# Patient Record
Sex: Female | Born: 1940 | Race: White | Hispanic: No | Marital: Single | Smoking: Former smoker
Health system: Southern US, Community
[De-identification: ages and names within clinical notes are randomized; demographics above are authoritative.]

## PROBLEM LIST (undated history)

## (undated) DIAGNOSIS — I639 Cerebral infarction, unspecified: Secondary | ICD-10-CM

## (undated) DIAGNOSIS — IMO0002 Reserved for concepts with insufficient information to code with codable children: Secondary | ICD-10-CM

## (undated) DIAGNOSIS — R519 Headache, unspecified: Secondary | ICD-10-CM

## (undated) DIAGNOSIS — N6019 Diffuse cystic mastopathy of unspecified breast: Secondary | ICD-10-CM

## (undated) DIAGNOSIS — E7800 Pure hypercholesterolemia, unspecified: Secondary | ICD-10-CM

## (undated) DIAGNOSIS — J45909 Unspecified asthma, uncomplicated: Secondary | ICD-10-CM

## (undated) DIAGNOSIS — K625 Hemorrhage of anus and rectum: Secondary | ICD-10-CM

## (undated) DIAGNOSIS — F410 Panic disorder [episodic paroxysmal anxiety] without agoraphobia: Secondary | ICD-10-CM

## (undated) DIAGNOSIS — F4310 Post-traumatic stress disorder, unspecified: Secondary | ICD-10-CM

## (undated) DIAGNOSIS — M1990 Unspecified osteoarthritis, unspecified site: Secondary | ICD-10-CM

## (undated) DIAGNOSIS — F32A Depression, unspecified: Secondary | ICD-10-CM

## (undated) DIAGNOSIS — K589 Irritable bowel syndrome without diarrhea: Secondary | ICD-10-CM

## (undated) DIAGNOSIS — K219 Gastro-esophageal reflux disease without esophagitis: Secondary | ICD-10-CM

## (undated) DIAGNOSIS — M797 Fibromyalgia: Secondary | ICD-10-CM

## (undated) DIAGNOSIS — G459 Transient cerebral ischemic attack, unspecified: Secondary | ICD-10-CM

## (undated) DIAGNOSIS — S46009A Unspecified injury of muscle(s) and tendon(s) of the rotator cuff of unspecified shoulder, initial encounter: Secondary | ICD-10-CM

## (undated) DIAGNOSIS — F329 Major depressive disorder, single episode, unspecified: Secondary | ICD-10-CM

## (undated) DIAGNOSIS — M8580 Other specified disorders of bone density and structure, unspecified site: Secondary | ICD-10-CM

## (undated) DIAGNOSIS — R51 Headache: Secondary | ICD-10-CM

## (undated) DIAGNOSIS — E039 Hypothyroidism, unspecified: Secondary | ICD-10-CM

## (undated) DIAGNOSIS — C4490 Unspecified malignant neoplasm of skin, unspecified: Secondary | ICD-10-CM

## (undated) DIAGNOSIS — C801 Malignant (primary) neoplasm, unspecified: Secondary | ICD-10-CM

## (undated) HISTORY — DX: Irritable bowel syndrome, unspecified: K58.9

## (undated) HISTORY — DX: Unspecified asthma, uncomplicated: J45.909

## (undated) HISTORY — DX: Other specified disorders of bone density and structure, unspecified site: M85.80

## (undated) HISTORY — DX: Post-traumatic stress disorder, unspecified: F43.10

## (undated) HISTORY — DX: Diffuse cystic mastopathy of unspecified breast: N60.19

## (undated) HISTORY — DX: Unspecified injury of muscle(s) and tendon(s) of the rotator cuff of unspecified shoulder, initial encounter: S46.009A

## (undated) HISTORY — DX: Headache, unspecified: R51.9

## (undated) HISTORY — DX: Fibromyalgia: M79.7

## (undated) HISTORY — DX: Cerebral infarction, unspecified: I63.9

## (undated) HISTORY — DX: Headache: R51

## (undated) HISTORY — DX: Pure hypercholesterolemia, unspecified: E78.00

## (undated) HISTORY — PX: ANKLE SURGERY: SHX546

## (undated) HISTORY — DX: Unspecified osteoarthritis, unspecified site: M19.90

## (undated) HISTORY — DX: Unspecified malignant neoplasm of skin, unspecified: C44.90

## (undated) HISTORY — DX: Hypothyroidism, unspecified: E03.9

## (undated) HISTORY — DX: Gastro-esophageal reflux disease without esophagitis: K21.9

## (undated) HISTORY — DX: Reserved for concepts with insufficient information to code with codable children: IMO0002

## (undated) HISTORY — DX: Hemorrhage of anus and rectum: K62.5

## (undated) HISTORY — DX: Depression, unspecified: F32.A

## (undated) HISTORY — DX: Malignant (primary) neoplasm, unspecified: C80.1

## (undated) HISTORY — DX: Major depressive disorder, single episode, unspecified: F32.9

## (undated) HISTORY — DX: Transient cerebral ischemic attack, unspecified: G45.9

## (undated) HISTORY — PX: MRI: SHX5353

## (undated) HISTORY — PX: BREAST CYST ASPIRATION: SHX578

---
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
## 1987-09-04 HISTORY — PX: THYROIDECTOMY: SHX17

## 1998-01-01 DIAGNOSIS — G459 Transient cerebral ischemic attack, unspecified: Secondary | ICD-10-CM

## 1998-01-01 HISTORY — DX: Transient cerebral ischemic attack, unspecified: G45.9

## 1998-01-12 ENCOUNTER — Emergency Department (HOSPITAL_COMMUNITY): Admission: EM | Admit: 1998-01-12 | Discharge: 1998-01-12 | Payer: Self-pay | Admitting: *Deleted

## 1998-09-03 DIAGNOSIS — M797 Fibromyalgia: Secondary | ICD-10-CM | POA: Insufficient documentation

## 1998-09-03 HISTORY — DX: Fibromyalgia: M79.7

## 1999-10-05 ENCOUNTER — Encounter: Payer: Self-pay | Admitting: Emergency Medicine

## 1999-10-05 ENCOUNTER — Emergency Department (HOSPITAL_COMMUNITY): Admission: EM | Admit: 1999-10-05 | Discharge: 1999-10-05 | Payer: Self-pay | Admitting: Emergency Medicine

## 2000-04-25 ENCOUNTER — Encounter: Payer: Self-pay | Admitting: Emergency Medicine

## 2000-04-25 ENCOUNTER — Emergency Department (HOSPITAL_COMMUNITY): Admission: EM | Admit: 2000-04-25 | Discharge: 2000-04-25 | Payer: Self-pay | Admitting: Emergency Medicine

## 2001-02-23 ENCOUNTER — Ambulatory Visit (HOSPITAL_COMMUNITY): Admission: RE | Admit: 2001-02-23 | Discharge: 2001-02-23 | Payer: Self-pay | Admitting: Internal Medicine

## 2001-02-23 ENCOUNTER — Encounter: Payer: Self-pay | Admitting: Internal Medicine

## 2001-06-18 ENCOUNTER — Ambulatory Visit (HOSPITAL_COMMUNITY): Admission: RE | Admit: 2001-06-18 | Discharge: 2001-06-18 | Payer: Self-pay | Admitting: Gastroenterology

## 2001-11-18 ENCOUNTER — Other Ambulatory Visit: Admission: RE | Admit: 2001-11-18 | Discharge: 2001-11-18 | Payer: Self-pay | Admitting: Internal Medicine

## 2002-02-22 ENCOUNTER — Ambulatory Visit (HOSPITAL_COMMUNITY): Admission: RE | Admit: 2002-02-22 | Discharge: 2002-02-22 | Payer: Self-pay | Admitting: Interventional Radiology

## 2002-02-22 ENCOUNTER — Encounter: Payer: Self-pay | Admitting: Rheumatology

## 2002-10-15 ENCOUNTER — Encounter: Admission: RE | Admit: 2002-10-15 | Discharge: 2002-10-15 | Payer: Self-pay | Admitting: Internal Medicine

## 2002-10-15 ENCOUNTER — Encounter: Payer: Self-pay | Admitting: Internal Medicine

## 2004-02-09 ENCOUNTER — Encounter (HOSPITAL_COMMUNITY): Admission: RE | Admit: 2004-02-09 | Discharge: 2004-05-09 | Payer: Self-pay | Admitting: Internal Medicine

## 2004-05-19 ENCOUNTER — Other Ambulatory Visit: Payer: Self-pay

## 2005-04-11 ENCOUNTER — Ambulatory Visit: Payer: Self-pay | Admitting: General Surgery

## 2005-04-17 ENCOUNTER — Ambulatory Visit: Payer: Self-pay

## 2005-09-03 HISTORY — PX: KNEE SURGERY: SHX244

## 2005-09-18 ENCOUNTER — Encounter: Admission: RE | Admit: 2005-09-18 | Discharge: 2005-09-18 | Payer: Self-pay | Admitting: Internal Medicine

## 2005-10-22 ENCOUNTER — Encounter: Admission: RE | Admit: 2005-10-22 | Discharge: 2005-10-22 | Payer: Self-pay | Admitting: Orthopaedic Surgery

## 2005-10-23 ENCOUNTER — Ambulatory Visit (HOSPITAL_BASED_OUTPATIENT_CLINIC_OR_DEPARTMENT_OTHER): Admission: RE | Admit: 2005-10-23 | Discharge: 2005-10-23 | Payer: Self-pay | Admitting: Orthopaedic Surgery

## 2006-01-01 ENCOUNTER — Other Ambulatory Visit: Admission: RE | Admit: 2006-01-01 | Discharge: 2006-01-01 | Payer: Self-pay | Admitting: Family Medicine

## 2006-01-17 ENCOUNTER — Emergency Department (HOSPITAL_COMMUNITY): Admission: EM | Admit: 2006-01-17 | Discharge: 2006-01-17 | Payer: Self-pay | Admitting: Emergency Medicine

## 2006-01-23 ENCOUNTER — Ambulatory Visit (HOSPITAL_COMMUNITY): Admission: RE | Admit: 2006-01-23 | Discharge: 2006-01-23 | Payer: Self-pay | Admitting: Family Medicine

## 2006-02-28 ENCOUNTER — Ambulatory Visit (HOSPITAL_COMMUNITY): Admission: RE | Admit: 2006-02-28 | Discharge: 2006-02-28 | Payer: Self-pay | Admitting: Neurology

## 2006-04-20 ENCOUNTER — Emergency Department: Payer: Self-pay | Admitting: Emergency Medicine

## 2006-06-11 ENCOUNTER — Ambulatory Visit: Payer: Self-pay | Admitting: General Surgery

## 2006-07-02 ENCOUNTER — Ambulatory Visit (HOSPITAL_BASED_OUTPATIENT_CLINIC_OR_DEPARTMENT_OTHER): Admission: RE | Admit: 2006-07-02 | Discharge: 2006-07-02 | Payer: Self-pay | Admitting: Orthopaedic Surgery

## 2007-08-07 ENCOUNTER — Ambulatory Visit: Payer: Self-pay | Admitting: General Surgery

## 2008-03-19 ENCOUNTER — Encounter: Payer: Self-pay | Admitting: Rheumatology

## 2008-09-16 ENCOUNTER — Ambulatory Visit: Payer: Self-pay | Admitting: General Surgery

## 2009-06-13 ENCOUNTER — Emergency Department: Payer: Self-pay | Admitting: Emergency Medicine

## 2009-09-03 HISTORY — PX: COLONOSCOPY: SHX174

## 2009-09-16 ENCOUNTER — Emergency Department: Payer: Self-pay | Admitting: Emergency Medicine

## 2009-10-10 ENCOUNTER — Ambulatory Visit: Payer: Self-pay | Admitting: General Surgery

## 2010-03-30 ENCOUNTER — Other Ambulatory Visit: Admission: RE | Admit: 2010-03-30 | Discharge: 2010-03-30 | Payer: Self-pay | Admitting: Family Medicine

## 2010-09-24 ENCOUNTER — Encounter: Payer: Self-pay | Admitting: Internal Medicine
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
## 2011-02-08 ENCOUNTER — Ambulatory Visit: Payer: Self-pay | Admitting: General Surgery

## 2011-07-06 DIAGNOSIS — Q279 Congenital malformation of peripheral vascular system, unspecified: Secondary | ICD-10-CM | POA: Insufficient documentation

## 2011-10-01 DIAGNOSIS — N63 Unspecified lump in unspecified breast: Secondary | ICD-10-CM | POA: Diagnosis not present

## 2011-10-01 DIAGNOSIS — Z803 Family history of malignant neoplasm of breast: Secondary | ICD-10-CM | POA: Diagnosis not present

## 2011-10-05 DIAGNOSIS — Q825 Congenital non-neoplastic nevus: Secondary | ICD-10-CM | POA: Diagnosis not present

## 2011-10-25 ENCOUNTER — Other Ambulatory Visit: Payer: Self-pay | Admitting: Family Medicine

## 2011-10-25 ENCOUNTER — Ambulatory Visit
Admission: RE | Admit: 2011-10-25 | Discharge: 2011-10-25 | Disposition: A | Discharge status: Home / Self Care | Payer: Medicare Other | Source: Ambulatory Visit | Attending: Family Medicine | Admitting: Family Medicine

## 2011-10-25 DIAGNOSIS — E785 Hyperlipidemia, unspecified: Secondary | ICD-10-CM | POA: Diagnosis not present

## 2011-10-25 DIAGNOSIS — R059 Cough, unspecified: Secondary | ICD-10-CM

## 2011-10-25 DIAGNOSIS — R05 Cough: Secondary | ICD-10-CM

## 2011-10-25 DIAGNOSIS — S90129A Contusion of unspecified lesser toe(s) without damage to nail, initial encounter: Secondary | ICD-10-CM

## 2011-10-25 DIAGNOSIS — M79609 Pain in unspecified limb: Secondary | ICD-10-CM | POA: Diagnosis not present

## 2011-10-25 DIAGNOSIS — M7989 Other specified soft tissue disorders: Secondary | ICD-10-CM | POA: Diagnosis not present

## 2011-10-25 DIAGNOSIS — R0602 Shortness of breath: Secondary | ICD-10-CM | POA: Diagnosis not present

## 2011-10-25 DIAGNOSIS — G459 Transient cerebral ischemic attack, unspecified: Secondary | ICD-10-CM | POA: Diagnosis not present

## 2011-10-25 DIAGNOSIS — IMO0001 Reserved for inherently not codable concepts without codable children: Secondary | ICD-10-CM | POA: Diagnosis not present

## 2011-10-25 DIAGNOSIS — E039 Hypothyroidism, unspecified: Secondary | ICD-10-CM | POA: Diagnosis not present

## 2011-10-25 DIAGNOSIS — M4120 Other idiopathic scoliosis, site unspecified: Secondary | ICD-10-CM | POA: Diagnosis not present

## 2011-11-20 DIAGNOSIS — J45909 Unspecified asthma, uncomplicated: Secondary | ICD-10-CM | POA: Diagnosis not present

## 2011-11-20 DIAGNOSIS — R51 Headache: Secondary | ICD-10-CM | POA: Diagnosis not present

## 2011-11-20 DIAGNOSIS — J209 Acute bronchitis, unspecified: Secondary | ICD-10-CM | POA: Diagnosis not present

## 2011-11-30 DIAGNOSIS — Q825 Congenital non-neoplastic nevus: Secondary | ICD-10-CM | POA: Diagnosis not present

## 2011-12-03 DIAGNOSIS — M533 Sacrococcygeal disorders, not elsewhere classified: Secondary | ICD-10-CM | POA: Diagnosis not present

## 2011-12-13 DIAGNOSIS — F4323 Adjustment disorder with mixed anxiety and depressed mood: Secondary | ICD-10-CM | POA: Diagnosis not present

## 2011-12-18 DIAGNOSIS — M533 Sacrococcygeal disorders, not elsewhere classified: Secondary | ICD-10-CM | POA: Diagnosis not present

## 2012-01-29 DIAGNOSIS — J209 Acute bronchitis, unspecified: Secondary | ICD-10-CM | POA: Diagnosis not present

## 2012-02-29 DIAGNOSIS — Q825 Congenital non-neoplastic nevus: Secondary | ICD-10-CM | POA: Diagnosis not present

## 2012-03-03 DIAGNOSIS — IMO0001 Reserved for inherently not codable concepts without codable children: Secondary | ICD-10-CM | POA: Diagnosis not present

## 2012-03-04 DIAGNOSIS — F4323 Adjustment disorder with mixed anxiety and depressed mood: Secondary | ICD-10-CM | POA: Diagnosis not present

## 2012-03-24 DIAGNOSIS — R5381 Other malaise: Secondary | ICD-10-CM | POA: Diagnosis not present

## 2012-03-24 DIAGNOSIS — R5383 Other fatigue: Secondary | ICD-10-CM | POA: Diagnosis not present

## 2012-05-02 DIAGNOSIS — Q825 Congenital non-neoplastic nevus: Secondary | ICD-10-CM | POA: Diagnosis not present

## 2012-06-10 DIAGNOSIS — F4323 Adjustment disorder with mixed anxiety and depressed mood: Secondary | ICD-10-CM | POA: Diagnosis not present

## 2012-06-10 DIAGNOSIS — N390 Urinary tract infection, site not specified: Secondary | ICD-10-CM | POA: Diagnosis not present

## 2012-07-11 DIAGNOSIS — Z23 Encounter for immunization: Secondary | ICD-10-CM | POA: Diagnosis not present

## 2012-08-08 DIAGNOSIS — Q825 Congenital non-neoplastic nevus: Secondary | ICD-10-CM | POA: Diagnosis not present

## 2012-08-12 DIAGNOSIS — F4323 Adjustment disorder with mixed anxiety and depressed mood: Secondary | ICD-10-CM | POA: Diagnosis not present

## 2012-08-19 ENCOUNTER — Ambulatory Visit: Payer: Self-pay | Admitting: General Surgery

## 2012-08-19 DIAGNOSIS — Z1231 Encounter for screening mammogram for malignant neoplasm of breast: Secondary | ICD-10-CM | POA: Diagnosis not present

## 2012-09-01 DIAGNOSIS — J45909 Unspecified asthma, uncomplicated: Secondary | ICD-10-CM | POA: Diagnosis not present

## 2012-09-01 DIAGNOSIS — IMO0001 Reserved for inherently not codable concepts without codable children: Secondary | ICD-10-CM | POA: Diagnosis not present

## 2012-09-01 DIAGNOSIS — J329 Chronic sinusitis, unspecified: Secondary | ICD-10-CM | POA: Diagnosis not present

## 2012-09-01 DIAGNOSIS — K219 Gastro-esophageal reflux disease without esophagitis: Secondary | ICD-10-CM | POA: Diagnosis not present

## 2012-09-01 DIAGNOSIS — E785 Hyperlipidemia, unspecified: Secondary | ICD-10-CM | POA: Diagnosis not present

## 2012-09-01 DIAGNOSIS — Z23 Encounter for immunization: Secondary | ICD-10-CM | POA: Diagnosis not present

## 2012-09-01 DIAGNOSIS — E039 Hypothyroidism, unspecified: Secondary | ICD-10-CM | POA: Diagnosis not present

## 2012-09-01 DIAGNOSIS — N899 Noninflammatory disorder of vagina, unspecified: Secondary | ICD-10-CM | POA: Diagnosis not present

## 2012-09-03 DIAGNOSIS — S46009A Unspecified injury of muscle(s) and tendon(s) of the rotator cuff of unspecified shoulder, initial encounter: Secondary | ICD-10-CM

## 2012-09-03 HISTORY — DX: Unspecified injury of muscle(s) and tendon(s) of the rotator cuff of unspecified shoulder, initial encounter: S46.009A

## 2012-10-14 ENCOUNTER — Other Ambulatory Visit: Payer: Self-pay | Admitting: Specialist

## 2012-10-14 DIAGNOSIS — R51 Headache: Secondary | ICD-10-CM

## 2012-10-21 DIAGNOSIS — F4323 Adjustment disorder with mixed anxiety and depressed mood: Secondary | ICD-10-CM | POA: Diagnosis not present

## 2012-10-22 ENCOUNTER — Ambulatory Visit
Admission: RE | Admit: 2012-10-22 | Discharge: 2012-10-22 | Disposition: A | Discharge status: Home / Self Care | Payer: Medicare Other | Source: Ambulatory Visit | Attending: Specialist | Admitting: Specialist

## 2012-10-22 DIAGNOSIS — R51 Headache: Secondary | ICD-10-CM

## 2012-10-22 MED ORDER — GADOBENATE DIMEGLUMINE 529 MG/ML IV SOLN
15.0000 mL | Freq: Once | INTRAVENOUS | Status: AC | PRN
  Administered 2012-10-22: 15 mL via INTRAVENOUS

## 2012-11-10 DIAGNOSIS — Z803 Family history of malignant neoplasm of breast: Secondary | ICD-10-CM | POA: Diagnosis not present

## 2012-11-10 DIAGNOSIS — IMO0001 Reserved for inherently not codable concepts without codable children: Secondary | ICD-10-CM | POA: Diagnosis not present

## 2012-11-10 DIAGNOSIS — G518 Other disorders of facial nerve: Secondary | ICD-10-CM | POA: Diagnosis not present

## 2012-11-10 DIAGNOSIS — R51 Headache: Secondary | ICD-10-CM | POA: Diagnosis not present

## 2012-11-10 DIAGNOSIS — N6019 Diffuse cystic mastopathy of unspecified breast: Secondary | ICD-10-CM | POA: Diagnosis not present

## 2012-11-10 DIAGNOSIS — M542 Cervicalgia: Secondary | ICD-10-CM | POA: Diagnosis not present

## 2012-11-24 DIAGNOSIS — M899 Disorder of bone, unspecified: Secondary | ICD-10-CM | POA: Diagnosis not present

## 2012-11-24 DIAGNOSIS — N899 Noninflammatory disorder of vagina, unspecified: Secondary | ICD-10-CM | POA: Diagnosis not present

## 2012-11-24 DIAGNOSIS — M949 Disorder of cartilage, unspecified: Secondary | ICD-10-CM | POA: Diagnosis not present

## 2012-11-24 DIAGNOSIS — N898 Other specified noninflammatory disorders of vagina: Secondary | ICD-10-CM | POA: Diagnosis not present

## 2012-11-24 DIAGNOSIS — E039 Hypothyroidism, unspecified: Secondary | ICD-10-CM | POA: Diagnosis not present

## 2012-12-11 ENCOUNTER — Emergency Department: Payer: Self-pay | Admitting: Emergency Medicine

## 2012-12-11 DIAGNOSIS — R197 Diarrhea, unspecified: Secondary | ICD-10-CM | POA: Diagnosis not present

## 2012-12-11 DIAGNOSIS — R1110 Vomiting, unspecified: Secondary | ICD-10-CM | POA: Diagnosis not present

## 2012-12-11 DIAGNOSIS — E039 Hypothyroidism, unspecified: Secondary | ICD-10-CM | POA: Diagnosis not present

## 2012-12-11 DIAGNOSIS — Z882 Allergy status to sulfonamides status: Secondary | ICD-10-CM | POA: Diagnosis not present

## 2012-12-11 DIAGNOSIS — Z79899 Other long term (current) drug therapy: Secondary | ICD-10-CM | POA: Diagnosis not present

## 2012-12-11 DIAGNOSIS — R1013 Epigastric pain: Secondary | ICD-10-CM | POA: Diagnosis not present

## 2012-12-11 DIAGNOSIS — R51 Headache: Secondary | ICD-10-CM | POA: Diagnosis not present

## 2012-12-11 LAB — COMPREHENSIVE METABOLIC PANEL
Albumin: 4.2 g/dL (ref 3.4–5.0)
Alkaline Phosphatase: 142 U/L — ABNORMAL HIGH (ref 50–136)
Anion Gap: 7 (ref 7–16)
BUN: 15 mg/dL (ref 7–18)
Bilirubin,Total: 0.4 mg/dL (ref 0.2–1.0)
Calcium, Total: 9.3 mg/dL (ref 8.5–10.1)
Chloride: 107 mmol/L (ref 98–107)
Co2: 26 mmol/L (ref 21–32)
Creatinine: 0.98 mg/dL (ref 0.60–1.30)
EGFR (African American): >60
EGFR (Non-African Amer.): 59 — ABNORMAL LOW
Sodium: 140 mmol/L (ref 136–145)
Total Protein: 7.8 g/dL (ref 6.4–8.2)

## 2012-12-11 LAB — COMPREHENSIVE METABOLIC PANEL WITH GFR
Glucose: 117 mg/dL — ABNORMAL HIGH (ref 65–99)
Osmolality: 281 (ref 275–301)
Potassium: 3.3 mmol/L — ABNORMAL LOW (ref 3.5–5.1)
SGOT(AST): 21 U/L (ref 15–37)
SGPT (ALT): 27 U/L (ref 12–78)

## 2012-12-12 LAB — URINALYSIS, COMPLETE
Bacteria: NONE SEEN
Bilirubin,UR: NEGATIVE
Glucose,UR: NEGATIVE mg/dL (ref 0–75)
Ketone: NEGATIVE
Nitrite: NEGATIVE
Ph: 7 (ref 4.5–8.0)
Protein: NEGATIVE
RBC,UR: 2 /HPF (ref 0–5)
Specific Gravity: 1.015 (ref 1.003–1.030)
Squamous Epithelial: 2
WBC UR: 11 /HPF (ref 0–5)

## 2012-12-12 LAB — CBC
HCT: 43.6 % (ref 35.0–47.0)
HGB: 15.2 g/dL (ref 12.0–16.0)
MCH: 31.8 pg (ref 26.0–34.0)
MCHC: 34.8 g/dL (ref 32.0–36.0)
MCV: 92 fL (ref 80–100)
Platelet: 232 10*3/uL (ref 150–440)
RBC: 4.77 10*6/uL (ref 3.80–5.20)
RDW: 12.8 % (ref 11.5–14.5)
WBC: 8.6 x10 3/mm 3 (ref 3.6–11.0)

## 2012-12-12 LAB — LIPASE, BLOOD: Lipase: 240 U/L (ref 73–393)

## 2012-12-12 LAB — TROPONIN I: Troponin-I: <0.02 ng/mL

## 2012-12-27 DIAGNOSIS — J069 Acute upper respiratory infection, unspecified: Secondary | ICD-10-CM | POA: Diagnosis not present

## 2013-01-05 DIAGNOSIS — N390 Urinary tract infection, site not specified: Secondary | ICD-10-CM | POA: Diagnosis not present

## 2013-01-16 ENCOUNTER — Other Ambulatory Visit: Payer: Self-pay | Admitting: Family Medicine

## 2013-01-16 ENCOUNTER — Other Ambulatory Visit (HOSPITAL_COMMUNITY)
Admission: RE | Admit: 2013-01-16 | Discharge: 2013-01-16 | Disposition: A | Discharge status: Home / Self Care | Payer: Medicare Other | Source: Ambulatory Visit | Attending: Family Medicine | Admitting: Family Medicine

## 2013-01-16 DIAGNOSIS — Z0000 Encounter for general adult medical examination without abnormal findings: Secondary | ICD-10-CM | POA: Diagnosis not present

## 2013-01-16 DIAGNOSIS — IMO0001 Reserved for inherently not codable concepts without codable children: Secondary | ICD-10-CM | POA: Diagnosis not present

## 2013-01-16 DIAGNOSIS — Z124 Encounter for screening for malignant neoplasm of cervix: Secondary | ICD-10-CM | POA: Diagnosis not present

## 2013-01-16 DIAGNOSIS — M949 Disorder of cartilage, unspecified: Secondary | ICD-10-CM | POA: Diagnosis not present

## 2013-01-16 DIAGNOSIS — E785 Hyperlipidemia, unspecified: Secondary | ICD-10-CM | POA: Diagnosis not present

## 2013-01-16 DIAGNOSIS — E039 Hypothyroidism, unspecified: Secondary | ICD-10-CM | POA: Diagnosis not present

## 2013-01-16 DIAGNOSIS — M899 Disorder of bone, unspecified: Secondary | ICD-10-CM | POA: Diagnosis not present

## 2013-01-16 DIAGNOSIS — G459 Transient cerebral ischemic attack, unspecified: Secondary | ICD-10-CM | POA: Diagnosis not present

## 2013-01-19 DIAGNOSIS — M899 Disorder of bone, unspecified: Secondary | ICD-10-CM | POA: Diagnosis not present

## 2013-01-19 DIAGNOSIS — M949 Disorder of cartilage, unspecified: Secondary | ICD-10-CM | POA: Diagnosis not present

## 2013-01-21 ENCOUNTER — Other Ambulatory Visit: Payer: Self-pay | Admitting: Family Medicine

## 2013-01-21 DIAGNOSIS — G459 Transient cerebral ischemic attack, unspecified: Secondary | ICD-10-CM

## 2013-01-29 ENCOUNTER — Ambulatory Visit
Admission: RE | Admit: 2013-01-29 | Discharge: 2013-01-29 | Disposition: A | Discharge status: Home / Self Care | Payer: Medicare Other | Source: Ambulatory Visit | Attending: Family Medicine | Admitting: Family Medicine

## 2013-01-29 DIAGNOSIS — G459 Transient cerebral ischemic attack, unspecified: Secondary | ICD-10-CM

## 2013-01-29 DIAGNOSIS — I658 Occlusion and stenosis of other precerebral arteries: Secondary | ICD-10-CM | POA: Diagnosis not present

## 2013-01-30 ENCOUNTER — Other Ambulatory Visit: Payer: Self-pay | Admitting: Family Medicine

## 2013-01-30 DIAGNOSIS — R9389 Abnormal findings on diagnostic imaging of other specified body structures: Secondary | ICD-10-CM

## 2013-02-05 ENCOUNTER — Inpatient Hospital Stay: Admission: RE | Admit: 2013-02-05 | Payer: Medicare Other | Source: Ambulatory Visit

## 2013-02-05 ENCOUNTER — Ambulatory Visit
Admission: RE | Admit: 2013-02-05 | Discharge: 2013-02-05 | Disposition: A | Discharge status: Home / Self Care | Payer: Medicare Other | Source: Ambulatory Visit | Attending: Family Medicine | Admitting: Family Medicine

## 2013-02-05 DIAGNOSIS — G459 Transient cerebral ischemic attack, unspecified: Secondary | ICD-10-CM | POA: Diagnosis not present

## 2013-02-05 DIAGNOSIS — R9389 Abnormal findings on diagnostic imaging of other specified body structures: Secondary | ICD-10-CM

## 2013-02-05 DIAGNOSIS — I658 Occlusion and stenosis of other precerebral arteries: Secondary | ICD-10-CM | POA: Diagnosis not present

## 2013-02-05 MED ORDER — IOHEXOL 350 MG/ML SOLN
100.0000 mL | Freq: Once | INTRAVENOUS | Status: AC | PRN
  Administered 2013-02-05: 100 mL via INTRAVENOUS

## 2013-03-03 DIAGNOSIS — F4323 Adjustment disorder with mixed anxiety and depressed mood: Secondary | ICD-10-CM | POA: Diagnosis not present

## 2013-03-09 DIAGNOSIS — M76899 Other specified enthesopathies of unspecified lower limb, excluding foot: Secondary | ICD-10-CM | POA: Diagnosis not present

## 2013-03-12 ENCOUNTER — Other Ambulatory Visit (HOSPITAL_COMMUNITY): Payer: Self-pay | Admitting: Gastroenterology

## 2013-03-12 DIAGNOSIS — K6289 Other specified diseases of anus and rectum: Secondary | ICD-10-CM | POA: Diagnosis not present

## 2013-03-12 DIAGNOSIS — R1310 Dysphagia, unspecified: Secondary | ICD-10-CM | POA: Diagnosis not present

## 2013-03-12 DIAGNOSIS — K5900 Constipation, unspecified: Secondary | ICD-10-CM | POA: Diagnosis not present

## 2013-03-13 ENCOUNTER — Encounter: Payer: Self-pay | Admitting: *Deleted

## 2013-03-18 ENCOUNTER — Ambulatory Visit (HOSPITAL_COMMUNITY)
Admission: RE | Admit: 2013-03-18 | Discharge: 2013-03-18 | Disposition: A | Discharge status: Home / Self Care | Payer: Medicare Other | Source: Ambulatory Visit | Attending: Gastroenterology | Admitting: Gastroenterology

## 2013-03-18 DIAGNOSIS — K449 Diaphragmatic hernia without obstruction or gangrene: Secondary | ICD-10-CM | POA: Insufficient documentation

## 2013-03-18 DIAGNOSIS — R1310 Dysphagia, unspecified: Secondary | ICD-10-CM | POA: Diagnosis not present

## 2013-03-18 DIAGNOSIS — R1319 Other dysphagia: Secondary | ICD-10-CM | POA: Insufficient documentation

## 2013-03-18 DIAGNOSIS — K219 Gastro-esophageal reflux disease without esophagitis: Secondary | ICD-10-CM | POA: Insufficient documentation

## 2013-03-18 NOTE — Procedures (Signed)
Objective Swallowing Evaluation: Modified Barium Swallowing Study  Patient Details  Name: Nichole [name] MRN: [id number] Date of Birth: 28-Nov-1940  Today's Date: 03/18/2013 Time: 4098-1191 SLP Time Calculation (min): 21 min  Past Medical History:  Past Medical History  Diagnosis Date  . Diffuse cystic mastopathy   . Fibromyalgia 2000  . TIA (transient ischemic attack) 01/1998  . Bilateral headaches    Past Surgical History:  Past Surgical History  Procedure Laterality Date  . Ankle surgery Left [id number]  . Knee surgery Right 2007  . Thyroidectomy  1989  . Tonsillectomy  1956  . Colonoscopy  2010   HPI:  Pt is a 72 year old female with c/o of occasional feeling of foods and liquids hesitating/sticking. MD concerned about extrinsic pressure on pharynx from caroid artery. Pt also has a history of GERD, thyroidectomy in 1989. She will have esophagram after MBS.      Assessment / Plan / Recommendation Clinical Impression  Dysphagia Diagnosis: Within Functional Limits Clinical impression: Pt demonstrates oral and oropharyngeal function WNL. No penetration or aspiration observed. Esophageal sweep unrevealing. There was appearance of a mild bony protrusion on anterior surface of the cervical spine at the level of the UES. During the study, this was asymptomatic, but could possibly account for pts globus. No radiologist present to confirm. Pt may continue regular diet and thin liquids.     Treatment Recommendation  No treatment recommended at this time    Diet Recommendation Regular;Thin liquid   Liquid Administration via: Cup;Straw Medication Administration: Whole meds with liquid Supervision: Patient able to self feed Postural Changes and/or Swallow Maneuvers: Seated upright 90 degrees    Other  Recommendations     Follow Up Recommendations  None    Frequency and Duration        Pertinent Vitals/Pain NA    SLP Swallow Goals     General HPI: Pt is a 72 year old  female with c/o of occasional feeling of foods and liquids hesitating/sticking. MD concerned about extrinsic pressure on pharynx from caroid artery. Pt also has a history of GERD, thyroidectomy in 1989. She will have esophagram after MBS.  Type of Study: Modified Barium Swallowing Study Reason for Referral: Objectively evaluate swallowing function Previous Swallow Assessment: none Diet Prior to this Study: Regular;Thin liquids Temperature Spikes Noted: No Respiratory Status: Room air History of Recent Intubation: No Behavior/Cognition: Alert;Cooperative;Pleasant mood Oral Cavity - Dentition: Adequate natural dentition Oral Motor / Sensory Function: Within functional limits Self-Feeding Abilities: Able to feed self Patient Positioning: Upright in chair Baseline Vocal Quality: Clear Volitional Cough: Strong Volitional Swallow: Able to elicit Anatomy: Within functional limits Pharyngeal Secretions: Not observed secondary MBS    Reason for Referral Objectively evaluate swallowing function   Oral Phase Oral Preparation/Oral Phase Oral Phase: WFL   Pharyngeal Phase Pharyngeal Phase Pharyngeal Phase: Within functional limits  Cervical Esophageal Phase    GO         Functional Assessment Tool Used: clinical judgement Functional Limitations: Swallowing Swallow Current Status (Z3086): 0 percent impaired, limited or restricted Swallow Goal Status (V7846): 0 percent impaired, limited or restricted Swallow Discharge Status [phone]): 0 percent impaired, limited or restricted   [facility], MA CCC-SLP [phone]  Claudine Mouton 03/18/2013, 11:00 AM

## 2013-04-01 DIAGNOSIS — R51 Headache: Secondary | ICD-10-CM | POA: Diagnosis not present

## 2013-04-01 DIAGNOSIS — G4452 New daily persistent headache (NDPH): Secondary | ICD-10-CM | POA: Diagnosis not present

## 2013-04-01 DIAGNOSIS — M62838 Other muscle spasm: Secondary | ICD-10-CM | POA: Diagnosis not present

## 2013-04-01 DIAGNOSIS — Z79899 Other long term (current) drug therapy: Secondary | ICD-10-CM | POA: Diagnosis not present

## 2013-04-01 DIAGNOSIS — M542 Cervicalgia: Secondary | ICD-10-CM | POA: Diagnosis not present

## 2013-04-01 DIAGNOSIS — IMO0001 Reserved for inherently not codable concepts without codable children: Secondary | ICD-10-CM | POA: Diagnosis not present

## 2013-04-22 DIAGNOSIS — G518 Other disorders of facial nerve: Secondary | ICD-10-CM | POA: Diagnosis not present

## 2013-04-22 DIAGNOSIS — IMO0001 Reserved for inherently not codable concepts without codable children: Secondary | ICD-10-CM | POA: Diagnosis not present

## 2013-04-22 DIAGNOSIS — R51 Headache: Secondary | ICD-10-CM | POA: Diagnosis not present

## 2013-04-22 DIAGNOSIS — M542 Cervicalgia: Secondary | ICD-10-CM | POA: Diagnosis not present

## 2013-04-23 DIAGNOSIS — E785 Hyperlipidemia, unspecified: Secondary | ICD-10-CM | POA: Diagnosis not present

## 2013-04-23 DIAGNOSIS — R159 Full incontinence of feces: Secondary | ICD-10-CM | POA: Diagnosis not present

## 2013-04-23 DIAGNOSIS — K6289 Other specified diseases of anus and rectum: Secondary | ICD-10-CM | POA: Diagnosis not present

## 2013-04-23 DIAGNOSIS — E559 Vitamin D deficiency, unspecified: Secondary | ICD-10-CM | POA: Diagnosis not present

## 2013-04-23 DIAGNOSIS — Z733 Stress, not elsewhere classified: Secondary | ICD-10-CM | POA: Diagnosis not present

## 2013-04-23 DIAGNOSIS — IMO0001 Reserved for inherently not codable concepts without codable children: Secondary | ICD-10-CM | POA: Diagnosis not present

## 2013-04-23 DIAGNOSIS — E039 Hypothyroidism, unspecified: Secondary | ICD-10-CM | POA: Diagnosis not present

## 2013-04-23 DIAGNOSIS — R3915 Urgency of urination: Secondary | ICD-10-CM | POA: Diagnosis not present

## 2013-05-06 DIAGNOSIS — M542 Cervicalgia: Secondary | ICD-10-CM | POA: Diagnosis not present

## 2013-05-06 DIAGNOSIS — R51 Headache: Secondary | ICD-10-CM | POA: Diagnosis not present

## 2013-05-06 DIAGNOSIS — IMO0001 Reserved for inherently not codable concepts without codable children: Secondary | ICD-10-CM | POA: Diagnosis not present

## 2013-05-06 DIAGNOSIS — G518 Other disorders of facial nerve: Secondary | ICD-10-CM | POA: Diagnosis not present

## 2013-05-20 DIAGNOSIS — M542 Cervicalgia: Secondary | ICD-10-CM | POA: Diagnosis not present

## 2013-05-20 DIAGNOSIS — G518 Other disorders of facial nerve: Secondary | ICD-10-CM | POA: Diagnosis not present

## 2013-05-20 DIAGNOSIS — IMO0001 Reserved for inherently not codable concepts without codable children: Secondary | ICD-10-CM | POA: Diagnosis not present

## 2013-05-20 DIAGNOSIS — R51 Headache: Secondary | ICD-10-CM | POA: Diagnosis not present

## 2013-06-02 DIAGNOSIS — M76899 Other specified enthesopathies of unspecified lower limb, excluding foot: Secondary | ICD-10-CM | POA: Diagnosis not present

## 2013-06-03 DIAGNOSIS — G518 Other disorders of facial nerve: Secondary | ICD-10-CM | POA: Diagnosis not present

## 2013-06-03 DIAGNOSIS — M542 Cervicalgia: Secondary | ICD-10-CM | POA: Diagnosis not present

## 2013-06-03 DIAGNOSIS — R51 Headache: Secondary | ICD-10-CM | POA: Diagnosis not present

## 2013-06-03 DIAGNOSIS — IMO0001 Reserved for inherently not codable concepts without codable children: Secondary | ICD-10-CM | POA: Diagnosis not present

## 2013-06-11 DIAGNOSIS — Z23 Encounter for immunization: Secondary | ICD-10-CM | POA: Diagnosis not present

## 2013-06-11 DIAGNOSIS — E039 Hypothyroidism, unspecified: Secondary | ICD-10-CM | POA: Diagnosis not present

## 2013-09-03 DIAGNOSIS — C801 Malignant (primary) neoplasm, unspecified: Secondary | ICD-10-CM

## 2013-09-03 HISTORY — DX: Malignant (primary) neoplasm, unspecified: C80.1

## 2013-09-03 HISTORY — PX: SKIN CANCER EXCISION: SHX779

## 2013-09-09 DIAGNOSIS — G4452 New daily persistent headache (NDPH): Secondary | ICD-10-CM | POA: Diagnosis not present

## 2013-09-18 DIAGNOSIS — E039 Hypothyroidism, unspecified: Secondary | ICD-10-CM | POA: Diagnosis not present

## 2013-10-26 DIAGNOSIS — M25519 Pain in unspecified shoulder: Secondary | ICD-10-CM | POA: Diagnosis not present

## 2013-11-02 ENCOUNTER — Ambulatory Visit: Payer: Self-pay | Admitting: General Surgery

## 2013-11-02 DIAGNOSIS — Z1231 Encounter for screening mammogram for malignant neoplasm of breast: Secondary | ICD-10-CM | POA: Diagnosis not present

## 2013-11-03 ENCOUNTER — Encounter: Payer: Self-pay | Admitting: General Surgery

## 2013-11-10 ENCOUNTER — Ambulatory Visit: Payer: Self-pay | Admitting: General Surgery

## 2013-11-18 ENCOUNTER — Ambulatory Visit: Payer: Self-pay | Admitting: General Surgery

## 2013-12-29 ENCOUNTER — Encounter: Payer: Self-pay | Admitting: General Surgery

## 2013-12-29 ENCOUNTER — Ambulatory Visit (INDEPENDENT_AMBULATORY_CARE_PROVIDER_SITE_OTHER): Payer: Medicare Other | Admitting: General Surgery

## 2013-12-29 VITALS — BP 140/74 | HR 76 | Resp 12 | Ht 62.0 in | Wt 161.0 lb

## 2013-12-29 DIAGNOSIS — Z1231 Encounter for screening mammogram for malignant neoplasm of breast: Secondary | ICD-10-CM | POA: Diagnosis not present

## 2013-12-29 DIAGNOSIS — Z87898 Personal history of other specified conditions: Secondary | ICD-10-CM | POA: Diagnosis not present

## 2013-12-29 NOTE — Patient Instructions (Signed)
Patient to return in one year. Continue self breast exams. Call office for any new breast issues or concerns.

## 2013-12-29 NOTE — Progress Notes (Signed)
 Patient ID: Nichole [name], female   DOB: 1941/02/23, 73 y.o.   MRN: [id number]  Chief Complaint  Patient presents with  . Follow-up    mammogram    HPI Nichole [name] is a 73 y.o. female.  who presents for her follow up mammogram and breast evaluation. The most recent mammogram was done on 11/02/13. Patient does perform regular self breast checks and gets regular mammograms done.    HPI  Past Medical History  Diagnosis Date  . Diffuse cystic mastopathy   . Fibromyalgia 2000  . TIA (transient ischemic attack) 01/1998  . Bilateral headaches     Past Surgical History  Procedure Laterality Date  . Ankle surgery Left 1062,6948  . Knee surgery Right 2007  . Thyroidectomy  1989  . Tonsillectomy  1956  . Colonoscopy  2010    History reviewed. No pertinent family history.  Social History History  Substance Use Topics  . Smoking status: Never Smoker   . Smokeless tobacco: Never Used  . Alcohol Use: Yes    Allergies  Allergen Reactions  . Ciprocinonide [Fluocinolone]   . Keflex [Cephalexin] Diarrhea  . Sulfa Antibiotics     Unknown     Current Outpatient Prescriptions  Medication Sig Dispense Refill  . atorvastatin (LIPITOR) 20 MG tablet Take 20 mg by mouth daily at 6 PM.       . baclofen (LIORESAL) 10 MG tablet Take 10 mg by mouth as needed.       . busPIRone (BUSPAR) 10 MG tablet Take 10 mg by mouth 2 (two) times daily.       . clopidogrel (PLAVIX) 75 MG tablet Take 75 mg by mouth daily with breakfast.       . DULoxetine (CYMBALTA) 30 MG capsule Take 30 mg by mouth daily.       Marland Kitchen escitalopram (LEXAPRO) 20 MG tablet Take 40 mg by mouth daily.       Marland Kitchen gabapentin (NEURONTIN) 600 MG tablet Take 600 mg by mouth 2 (two) times daily. 2 tab at night      . levothyroxine (SYNTHROID, LEVOTHROID) 75 MCG tablet Take 75 mcg by mouth every other day.       . levothyroxine (SYNTHROID, LEVOTHROID) 88 MCG tablet Take 88 mcg by mouth every other day.      . lidocaine (LIDODERM) 5 %        . pantoprazole (PROTONIX) 40 MG tablet Take 40 mg by mouth daily.       . QUEtiapine (SEROQUEL) 25 MG tablet Take 25 mg by mouth at bedtime.       . topiramate (TOPAMAX) 25 MG tablet Take 25 mg by mouth daily.        No current facility-administered medications for this visit.    Review of Systems Review of Systems  Constitutional: Negative.   Respiratory: Negative.   Cardiovascular: Negative.     Blood pressure 140/74, pulse 76, resp. rate 12, height 5\' 2"  (1.575 m), weight 161 lb (73.029 kg).  Physical Exam Physical Exam  Constitutional: She is oriented to person, place, and time. She appears well-developed and well-nourished.  Eyes: Conjunctivae are normal.  Neck: Neck supple.  Cardiovascular: Normal rate, regular rhythm and normal heart sounds.   Pulmonary/Chest: Breath sounds normal. Right breast exhibits no inverted nipple, no mass, no nipple discharge, no skin change and no tenderness. Left breast exhibits no inverted nipple, no mass, no nipple discharge, no skin change and no tenderness.  Abdominal: Bowel sounds are  normal. There is no tenderness.  Lymphadenopathy:    She has no cervical adenopathy.    She has no axillary adenopathy.  Neurological: She is alert and oriented to person, place, and time.  Skin: Skin is warm and dry.    Data Reviewed Mammogram reviewed   Assessment    Stable exam. History of FCD      Plan    Patient to return in one year bilateral screening mammogram.          G  12/30/2013, 6:13 AM

## 2013-12-30 ENCOUNTER — Encounter: Payer: Self-pay | Admitting: General Surgery

## 2013-12-30 DIAGNOSIS — Z87898 Personal history of other specified conditions: Secondary | ICD-10-CM | POA: Insufficient documentation

## 2014-01-19 DIAGNOSIS — G4452 New daily persistent headache (NDPH): Secondary | ICD-10-CM | POA: Diagnosis not present

## 2014-02-18 DIAGNOSIS — N760 Acute vaginitis: Secondary | ICD-10-CM | POA: Diagnosis not present

## 2014-02-18 DIAGNOSIS — Z1389 Encounter for screening for other disorder: Secondary | ICD-10-CM | POA: Diagnosis not present

## 2014-02-18 DIAGNOSIS — Z113 Encounter for screening for infections with a predominantly sexual mode of transmission: Secondary | ICD-10-CM | POA: Diagnosis not present

## 2014-02-18 DIAGNOSIS — Z1151 Encounter for screening for human papillomavirus (HPV): Secondary | ICD-10-CM | POA: Diagnosis not present

## 2014-02-18 DIAGNOSIS — Z01419 Encounter for gynecological examination (general) (routine) without abnormal findings: Secondary | ICD-10-CM | POA: Diagnosis not present

## 2014-02-22 IMAGING — US US CAROTID DUPLEX BILAT
1 series · 13 of 24 positions shown · non-contrast
Comparison: None

CLINICAL DATA: TIA, history strokes

BILATERAL CAROTID DUPLEX ULTRASOUND
TECHNIQUE: Gray scale imaging, color Doppler and duplex ultrasound
was performed of bilateral carotid and vertebral arteries in the
neck.

[Series 1: us carotid duplex bilat · 0.08mm/px · 13 of 66 slices shown]
[im 1/66]
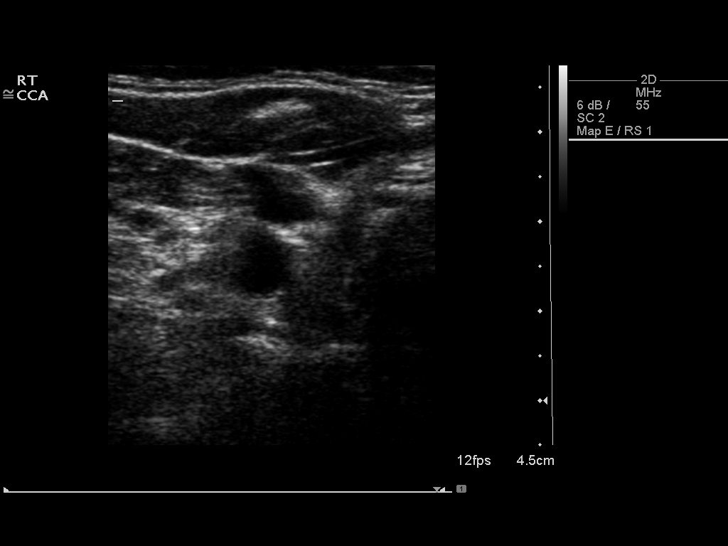
[im 6/66]
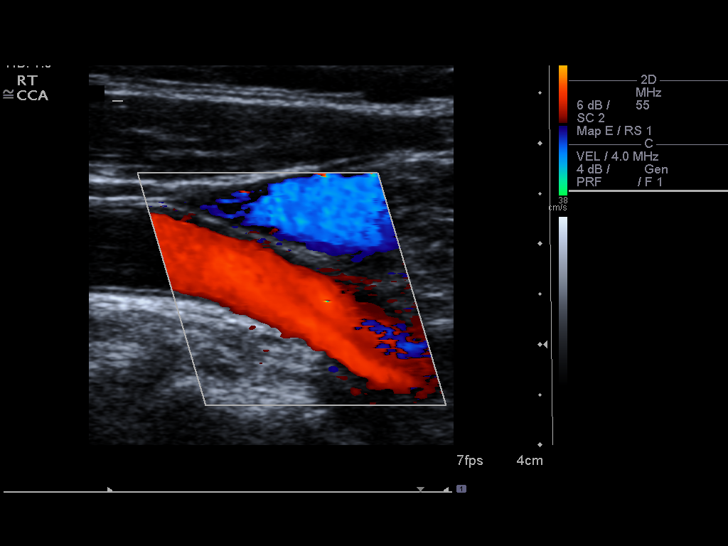
[im 12/66]
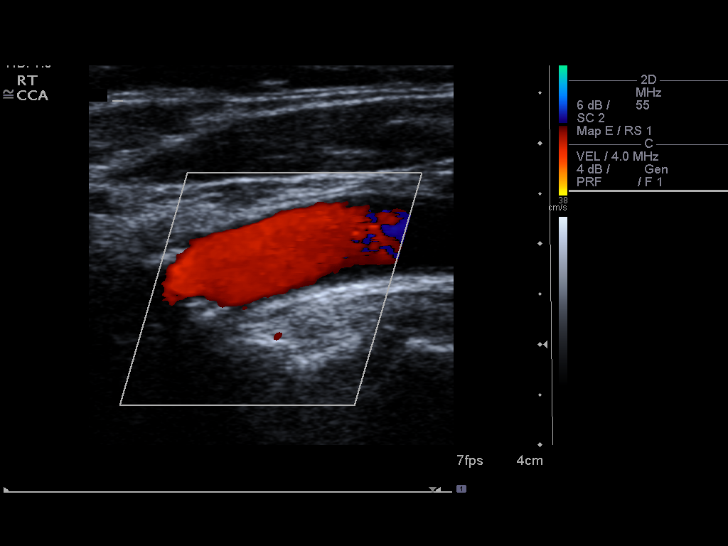
[im 17/66]
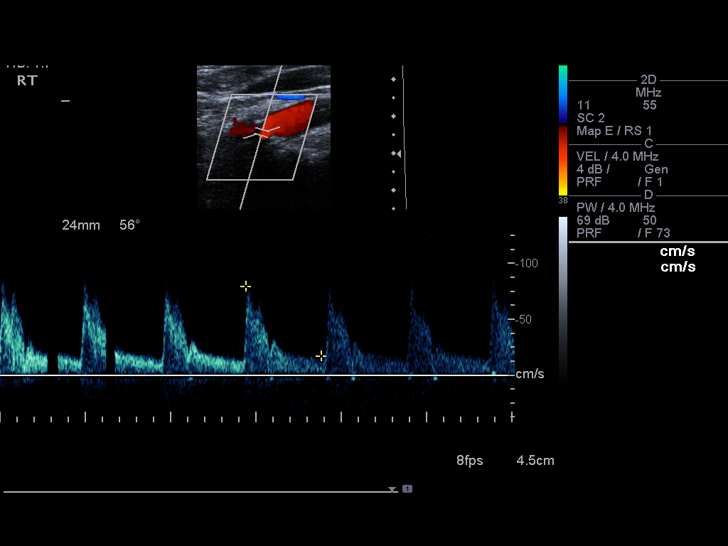
[im 23/66]
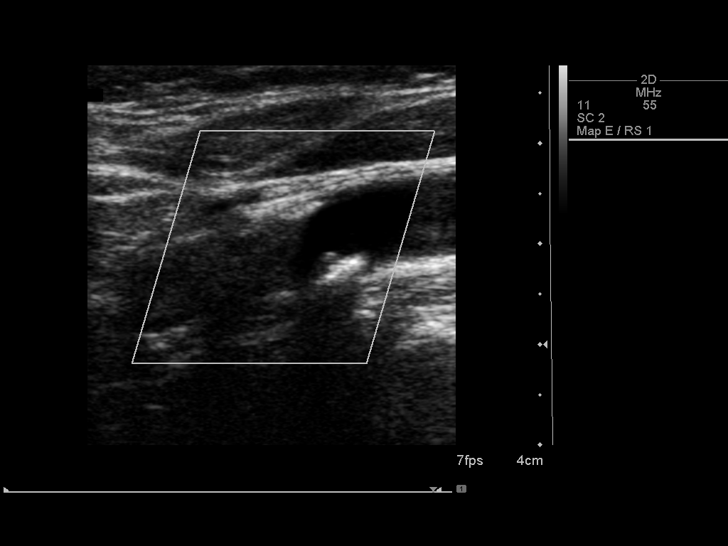
[im 29/66]
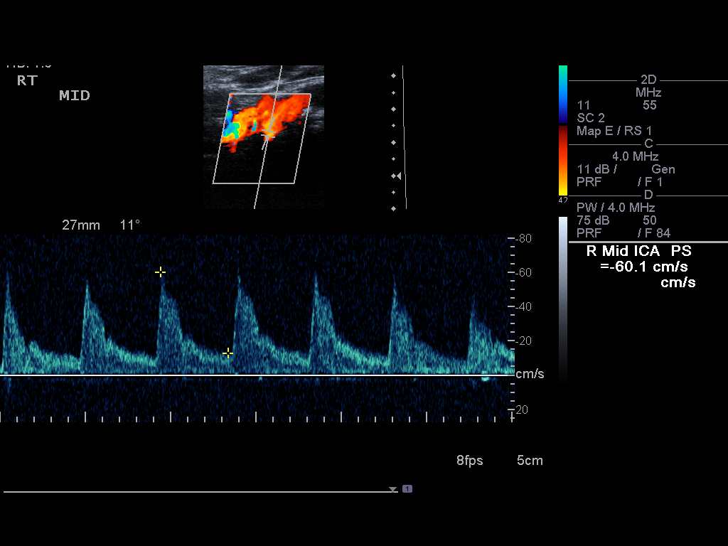
[im 34/66]
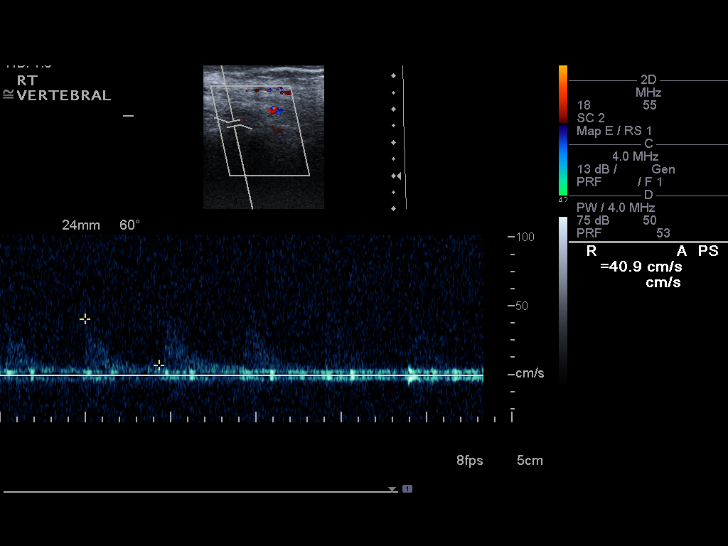
[im 37/66]
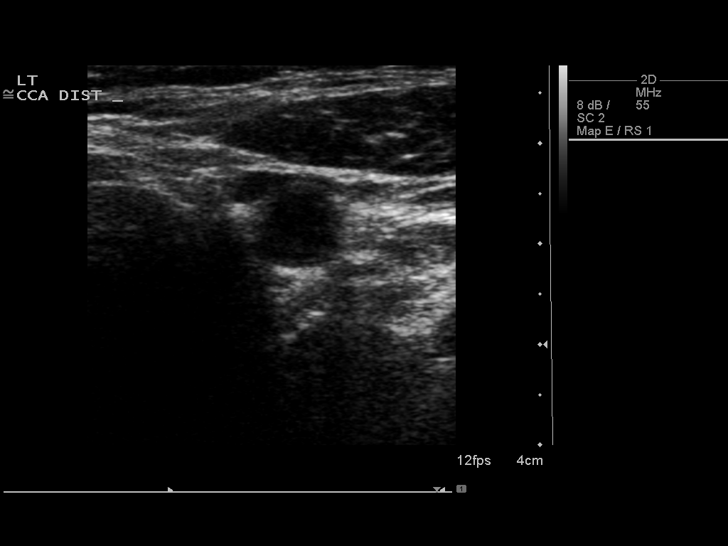
[im 43/66]
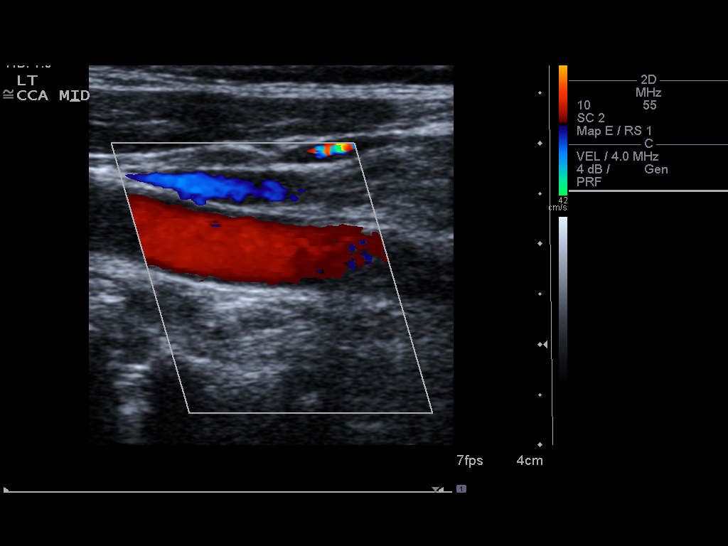
[im 49/66]
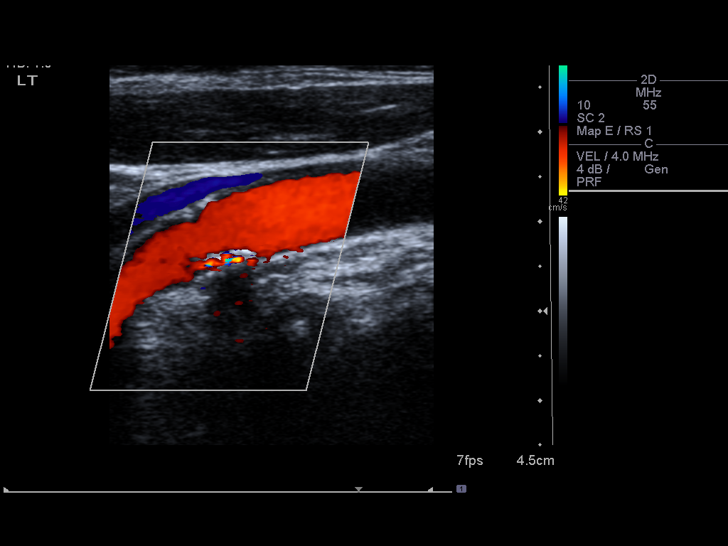
[im 54/66]
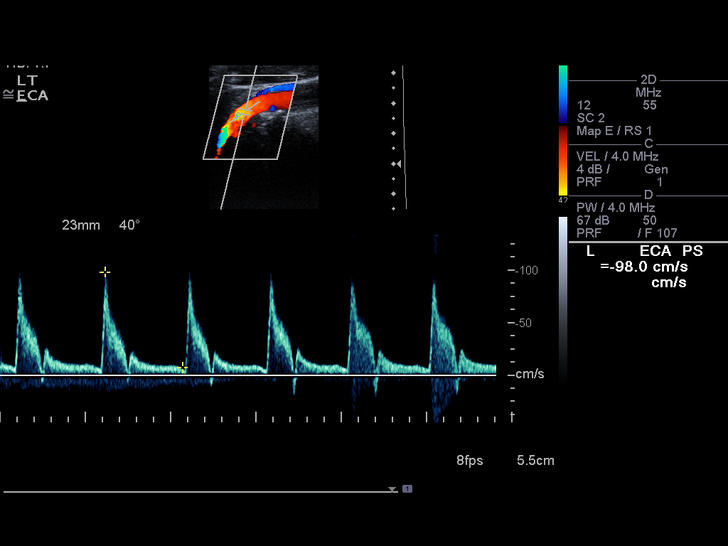
[im 60/66]
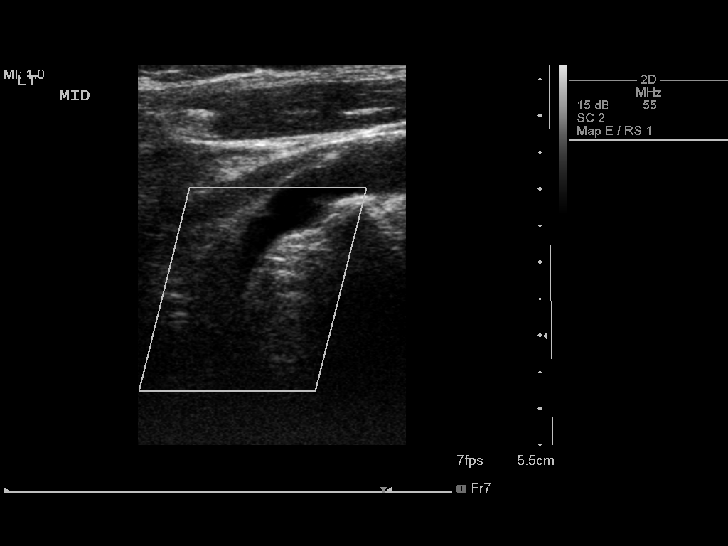
[im 66/66]
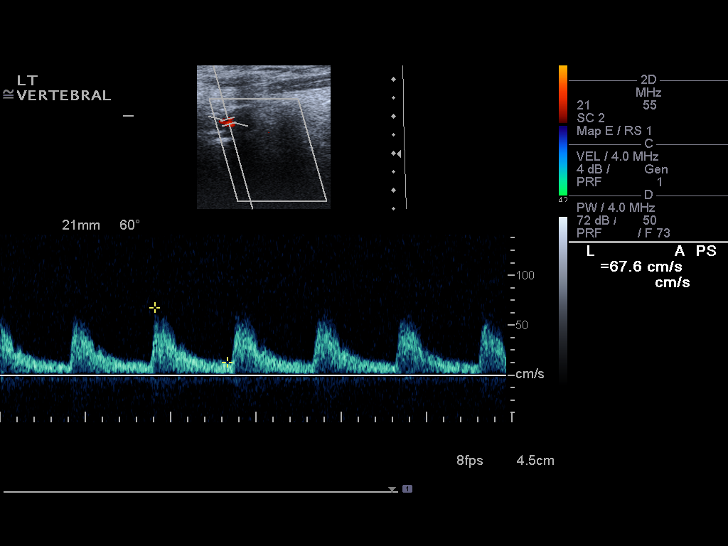

[13 of 24 positions shown; findings below may reference images not displayed]

Criteria:  Quantification of carotid stenosis is based on velocity
parameters that correlate the residual internal carotid diameter
with NASCET-based stenosis levels, using the diameter of the distal
internal carotid lumen as the denominator for stenosis measurement.

The following velocity measurements were obtained:

                 PEAK SYSTOLIC/END DIASTOLIC
RIGHT
ICA:                        67/12cm/sec
CCA:                        89/12cm/sec
SYSTOLIC ICA/CCA RATIO:
DIASTOLIC ICA/CCA RATIO:    4029
ECA:                        98cm/sec

LEFT
ICA:                        70/11cm/sec
CCA:                        92/13cm/sec
SYSTOLIC ICA/CCA RATIO:
DIASTOLIC ICA/CCA RATIO:
ECA:                        98cm/sec
FINDINGS: RIGHT CAROTID ARTERY: Distal ICA difficult to visualize due to
depth.  Mildly tortuous right carotid system.  Plaque identified at
the distal right carotid bulb and proximal right ICA.  Minimally
turbulent flow on color Doppler imaging with spectral broadening on
waveform analysis in right ICA.  No high velocity jets.

RIGHT VERTEBRAL ARTERY:  Patent, antegrade

LEFT CAROTID ARTERY: Small calcified plaque within left carotid
bulb with shadowing.  Additional calcified plaques seen at the
proximal left ICA and ECA.  Question blood flow undermining plaque
at the proximal left ICA cannot exclude ulceration.  Deep distal
left ICA is difficult to visualize.  Laminar flow on color Doppler
imaging with spectral broadening on waveform analysis.  No high
velocity jets.

LEFT VERTEBRAL ARTERY:  Patent, antegrade
IMPRESSION: Mild plaque formation at the carotid bulbs and proximal internal
carotid arteries.
Obtained velocity measurements correspond to a less than 50%
diameter stenoses bilaterally.
Question blood flow undermining plaque at the proximal left ICA
cannot exclude plaque ulceration; consider CTA imaging with
contrast for further evaluation.

## 2014-03-04 DIAGNOSIS — E785 Hyperlipidemia, unspecified: Secondary | ICD-10-CM | POA: Diagnosis not present

## 2014-03-04 DIAGNOSIS — E039 Hypothyroidism, unspecified: Secondary | ICD-10-CM | POA: Diagnosis not present

## 2014-03-04 DIAGNOSIS — J309 Allergic rhinitis, unspecified: Secondary | ICD-10-CM | POA: Diagnosis not present

## 2014-03-04 DIAGNOSIS — E559 Vitamin D deficiency, unspecified: Secondary | ICD-10-CM | POA: Diagnosis not present

## 2014-03-04 DIAGNOSIS — G459 Transient cerebral ischemic attack, unspecified: Secondary | ICD-10-CM | POA: Diagnosis not present

## 2014-03-04 DIAGNOSIS — J029 Acute pharyngitis, unspecified: Secondary | ICD-10-CM | POA: Diagnosis not present

## 2014-03-04 DIAGNOSIS — IMO0001 Reserved for inherently not codable concepts without codable children: Secondary | ICD-10-CM | POA: Diagnosis not present

## 2014-03-23 DIAGNOSIS — W57XXXA Bitten or stung by nonvenomous insect and other nonvenomous arthropods, initial encounter: Secondary | ICD-10-CM | POA: Diagnosis not present

## 2014-03-23 DIAGNOSIS — T148 Other injury of unspecified body region: Secondary | ICD-10-CM | POA: Diagnosis not present

## 2014-05-24 ENCOUNTER — Encounter: Payer: Self-pay | Admitting: General Surgery

## 2014-06-09 DIAGNOSIS — F331 Major depressive disorder, recurrent, moderate: Secondary | ICD-10-CM | POA: Diagnosis not present

## 2014-06-09 DIAGNOSIS — F4310 Post-traumatic stress disorder, unspecified: Secondary | ICD-10-CM | POA: Diagnosis not present

## 2014-06-09 DIAGNOSIS — F438 Other reactions to severe stress: Secondary | ICD-10-CM | POA: Diagnosis not present

## 2014-06-14 DIAGNOSIS — M25561 Pain in right knee: Secondary | ICD-10-CM | POA: Diagnosis not present

## 2014-06-14 DIAGNOSIS — M7061 Trochanteric bursitis, right hip: Secondary | ICD-10-CM | POA: Diagnosis not present

## 2014-06-17 DIAGNOSIS — M19041 Primary osteoarthritis, right hand: Secondary | ICD-10-CM | POA: Diagnosis not present

## 2014-06-17 DIAGNOSIS — M19042 Primary osteoarthritis, left hand: Secondary | ICD-10-CM | POA: Diagnosis not present

## 2014-07-05 ENCOUNTER — Encounter: Payer: Self-pay | Admitting: General Surgery

## 2014-07-15 DIAGNOSIS — M542 Cervicalgia: Secondary | ICD-10-CM | POA: Diagnosis not present

## 2014-07-15 DIAGNOSIS — M791 Myalgia: Secondary | ICD-10-CM | POA: Diagnosis not present

## 2014-07-15 DIAGNOSIS — G518 Other disorders of facial nerve: Secondary | ICD-10-CM | POA: Diagnosis not present

## 2014-07-15 DIAGNOSIS — G4452 New daily persistent headache (NDPH): Secondary | ICD-10-CM | POA: Diagnosis not present

## 2014-07-15 DIAGNOSIS — R51 Headache: Secondary | ICD-10-CM | POA: Diagnosis not present

## 2014-08-02 DIAGNOSIS — L304 Erythema intertrigo: Secondary | ICD-10-CM | POA: Diagnosis not present

## 2014-08-02 DIAGNOSIS — L821 Other seborrheic keratosis: Secondary | ICD-10-CM | POA: Diagnosis not present

## 2014-08-02 DIAGNOSIS — C44712 Basal cell carcinoma of skin of right lower limb, including hip: Secondary | ICD-10-CM | POA: Diagnosis not present

## 2014-08-02 DIAGNOSIS — D485 Neoplasm of uncertain behavior of skin: Secondary | ICD-10-CM | POA: Diagnosis not present

## 2014-08-22 DIAGNOSIS — J019 Acute sinusitis, unspecified: Secondary | ICD-10-CM | POA: Diagnosis not present

## 2014-08-22 DIAGNOSIS — R51 Headache: Secondary | ICD-10-CM | POA: Diagnosis not present

## 2014-09-06 DIAGNOSIS — G43919 Migraine, unspecified, intractable, without status migrainosus: Secondary | ICD-10-CM | POA: Diagnosis not present

## 2014-09-06 DIAGNOSIS — E559 Vitamin D deficiency, unspecified: Secondary | ICD-10-CM | POA: Diagnosis not present

## 2014-09-06 DIAGNOSIS — M797 Fibromyalgia: Secondary | ICD-10-CM | POA: Diagnosis not present

## 2014-09-06 DIAGNOSIS — E785 Hyperlipidemia, unspecified: Secondary | ICD-10-CM | POA: Diagnosis not present

## 2014-09-06 DIAGNOSIS — F324 Major depressive disorder, single episode, in partial remission: Secondary | ICD-10-CM | POA: Diagnosis not present

## 2014-09-06 DIAGNOSIS — E039 Hypothyroidism, unspecified: Secondary | ICD-10-CM | POA: Diagnosis not present

## 2014-09-07 DIAGNOSIS — C4491 Basal cell carcinoma of skin, unspecified: Secondary | ICD-10-CM | POA: Diagnosis not present

## 2014-09-07 DIAGNOSIS — C44712 Basal cell carcinoma of skin of right lower limb, including hip: Secondary | ICD-10-CM | POA: Diagnosis not present

## 2014-10-04 DIAGNOSIS — Z23 Encounter for immunization: Secondary | ICD-10-CM | POA: Diagnosis not present

## 2014-10-22 ENCOUNTER — Encounter: Payer: Self-pay | Admitting: Neurology

## 2014-10-22 ENCOUNTER — Ambulatory Visit (INDEPENDENT_AMBULATORY_CARE_PROVIDER_SITE_OTHER): Payer: Medicare Other | Admitting: Neurology

## 2014-10-22 VITALS — BP 122/60 | HR 76 | Ht 62.5 in | Wt 163.0 lb

## 2014-10-22 DIAGNOSIS — G43809 Other migraine, not intractable, without status migrainosus: Secondary | ICD-10-CM

## 2014-10-22 DIAGNOSIS — G43009 Migraine without aura, not intractable, without status migrainosus: Secondary | ICD-10-CM

## 2014-10-22 MED ORDER — DULOXETINE HCL 60 MG PO CPEP: 60.0000 mg | ORAL_CAPSULE | Freq: Every day | ORAL | Status: DC

## 2014-10-22 NOTE — Patient Instructions (Signed)
1.  Increase Cymbalta to 60mg  at bedtime 2.  Stop the Topamax 3.  When you get a headache, take Excedrin Migraine or Aleve.  Limit use of Aleve if possible 4.  Call in 4 weeks with update 5.  Follow up in 3 months.
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
## 2014-10-28 DIAGNOSIS — F4310 Post-traumatic stress disorder, unspecified: Secondary | ICD-10-CM | POA: Diagnosis not present

## 2014-10-28 DIAGNOSIS — F0631 Mood disorder due to known physiological condition with depressive features: Secondary | ICD-10-CM | POA: Diagnosis not present

## 2014-10-28 DIAGNOSIS — F332 Major depressive disorder, recurrent severe without psychotic features: Secondary | ICD-10-CM | POA: Diagnosis not present

## 2014-10-29 ENCOUNTER — Telehealth: Payer: Self-pay | Admitting: Neurology

## 2014-10-29 NOTE — Telephone Encounter (Signed)
Note faxed to Dr White at [phone] with confirmation received.   

## 2014-11-16 ENCOUNTER — Ambulatory Visit: Payer: Medicare Other | Admitting: General Surgery

## 2014-11-22 DIAGNOSIS — R928 Other abnormal and inconclusive findings on diagnostic imaging of breast: Secondary | ICD-10-CM | POA: Diagnosis not present

## 2014-11-22 DIAGNOSIS — Z1231 Encounter for screening mammogram for malignant neoplasm of breast: Secondary | ICD-10-CM | POA: Diagnosis not present

## 2014-12-03 DIAGNOSIS — R928 Other abnormal and inconclusive findings on diagnostic imaging of breast: Secondary | ICD-10-CM | POA: Diagnosis not present

## 2014-12-03 DIAGNOSIS — N6002 Solitary cyst of left breast: Secondary | ICD-10-CM | POA: Diagnosis not present

## 2014-12-08 ENCOUNTER — Ambulatory Visit: Payer: Medicare Other | Admitting: General Surgery

## 2014-12-13 ENCOUNTER — Encounter: Payer: Self-pay | Admitting: General Surgery

## 2014-12-16 ENCOUNTER — Ambulatory Visit (INDEPENDENT_AMBULATORY_CARE_PROVIDER_SITE_OTHER): Payer: Medicare Other | Admitting: General Surgery

## 2014-12-16 ENCOUNTER — Encounter: Payer: Self-pay | Admitting: General Surgery

## 2014-12-16 VITALS — BP 124/74 | HR 76 | Resp 12 | Ht 62.0 in | Wt 167.0 lb

## 2014-12-16 DIAGNOSIS — Z87898 Personal history of other specified conditions: Secondary | ICD-10-CM

## 2014-12-16 DIAGNOSIS — Z803 Family history of malignant neoplasm of breast: Secondary | ICD-10-CM

## 2014-12-16 DIAGNOSIS — Z800 Family history of malignant neoplasm of digestive organs: Secondary | ICD-10-CM | POA: Diagnosis not present
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
## 2014-12-16 NOTE — Patient Instructions (Addendum)
Patient will be asked to return to the office in six months left breast ultrasound.

## 2014-12-17 ENCOUNTER — Encounter: Payer: Self-pay | Admitting: General Surgery

## 2014-12-23 DIAGNOSIS — E559 Vitamin D deficiency, unspecified: Secondary | ICD-10-CM | POA: Diagnosis not present

## 2014-12-23 DIAGNOSIS — Z23 Encounter for immunization: Secondary | ICD-10-CM | POA: Diagnosis not present

## 2014-12-28 ENCOUNTER — Telehealth: Payer: Self-pay | Admitting: *Deleted

## 2014-12-28 NOTE — Telephone Encounter (Signed)
Medicare does not cover BRCA if she has not been DX with cancer. [employer] says that [employer] is out of network benefits. Pt wants to wait for now, she may try to pay for testing herself. Routine screening will continue as scheduled.

## 2015-01-04 ENCOUNTER — Other Ambulatory Visit: Payer: Self-pay | Admitting: Neurology

## 2015-01-25 ENCOUNTER — Ambulatory Visit: Payer: Medicare Other | Admitting: Neurology

## 2015-02-24 ENCOUNTER — Emergency Department: Payer: Medicare Other

## 2015-02-24 ENCOUNTER — Encounter: Payer: Self-pay | Admitting: *Deleted

## 2015-02-24 ENCOUNTER — Emergency Department
Admission: EM | Admit: 2015-02-24 | Discharge: 2015-02-24 | Disposition: A | Discharge status: Home / Self Care | Payer: Medicare Other | Attending: Emergency Medicine | Admitting: Emergency Medicine

## 2015-02-24 DIAGNOSIS — Y9389 Activity, other specified: Secondary | ICD-10-CM | POA: Diagnosis not present

## 2015-02-24 DIAGNOSIS — W228XXA Striking against or struck by other objects, initial encounter: Secondary | ICD-10-CM | POA: Diagnosis not present

## 2015-02-24 DIAGNOSIS — Y998 Other external cause status: Secondary | ICD-10-CM | POA: Insufficient documentation

## 2015-02-24 DIAGNOSIS — S0083XA Contusion of other part of head, initial encounter: Secondary | ICD-10-CM | POA: Diagnosis not present

## 2015-02-24 DIAGNOSIS — F4310 Post-traumatic stress disorder, unspecified: Secondary | ICD-10-CM | POA: Diagnosis not present

## 2015-02-24 DIAGNOSIS — Z79899 Other long term (current) drug therapy: Secondary | ICD-10-CM | POA: Insufficient documentation

## 2015-02-24 DIAGNOSIS — Z87891 Personal history of nicotine dependence: Secondary | ICD-10-CM | POA: Insufficient documentation

## 2015-02-24 DIAGNOSIS — F0631 Mood disorder due to known physiological condition with depressive features: Secondary | ICD-10-CM | POA: Diagnosis not present

## 2015-02-24 DIAGNOSIS — S0993XA Unspecified injury of face, initial encounter: Secondary | ICD-10-CM | POA: Diagnosis not present

## 2015-02-24 DIAGNOSIS — S0990XA Unspecified injury of head, initial encounter: Secondary | ICD-10-CM | POA: Diagnosis present

## 2015-02-24 DIAGNOSIS — Y9289 Other specified places as the place of occurrence of the external cause: Secondary | ICD-10-CM | POA: Diagnosis not present

## 2015-02-24 DIAGNOSIS — F332 Major depressive disorder, recurrent severe without psychotic features: Secondary | ICD-10-CM | POA: Diagnosis not present

## 2015-02-24 DIAGNOSIS — S0012XA Contusion of left eyelid and periocular area, initial encounter: Secondary | ICD-10-CM | POA: Diagnosis not present

## 2015-02-24 DIAGNOSIS — H578 Other specified disorders of eye and adnexa: Secondary | ICD-10-CM | POA: Diagnosis not present

## 2015-02-24 MED ORDER — ACETAMINOPHEN 325 MG PO TABS
650.0000 mg | ORAL_TABLET | Freq: Once | ORAL | Status: AC
  Administered 2015-02-24: 650 mg via ORAL

## 2015-02-24 MED ORDER — ACETAMINOPHEN 325 MG PO TABS
ORAL_TABLET | ORAL | Status: AC
  Administered 2015-02-24: 650 mg via ORAL
  Filled 2015-02-24: qty 2
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
## 2015-02-24 NOTE — Discharge Instructions (Signed)
Contusion °A contusion is a deep bruise. Contusions are the result of an injury that caused bleeding under the skin. The contusion may turn blue, purple, or yellow. Minor injuries will give you a painless contusion, but more severe contusions may stay painful and swollen for a few weeks.  °CAUSES  °A contusion is usually caused by a blow, trauma, or direct force to an area of the body. °SYMPTOMS  °· Swelling and redness of the injured area. °· Bruising of the injured area. °· Tenderness and soreness of the injured area. °· Pain. °DIAGNOSIS  °The diagnosis can be made by taking a history and physical exam. An X-ray, CT scan, or MRI may be needed to determine if there were any associated injuries, such as fractures. °TREATMENT  °Specific treatment will depend on what area of the body was injured. In general, the best treatment for a contusion is resting, icing, elevating, and applying cold compresses to the injured area. Over-the-counter medicines may also be recommended for pain control. Ask your caregiver what the best treatment is for your contusion. °HOME CARE INSTRUCTIONS  °· Put ice on the injured area. °¨ Put ice in a plastic bag. °¨ Place a towel between your skin and the bag. °¨ Leave the ice on for 15-20 minutes, 3-4 times a day, or as directed by your health care provider. °· Only take over-the-counter or prescription medicines for pain, discomfort, or fever as directed by your caregiver. Your caregiver may recommend avoiding anti-inflammatory medicines (aspirin, ibuprofen, and naproxen) for 48 hours because these medicines may increase bruising. °· Rest the injured area. °· If possible, elevate the injured area to reduce swelling. °SEEK IMMEDIATE MEDICAL CARE IF:  °· You have increased bruising or swelling. °· You have pain that is getting worse. °· Your swelling or pain is not relieved with medicines. °MAKE SURE YOU:  °· Understand these instructions. °· Will watch your condition. °· Will get help right  away if you are not doing well or get worse. °Document Released: 05/30/2005 Document Revised: 08/25/2013 Document Reviewed: 06/25/2011 °ExitCare® Patient Information ©2015 [employer]. This information is not intended to replace advice given to you by your health care provider. Make sure you discuss any questions you have with your health care provider. ° °

## 2015-02-24 NOTE — ED Notes (Signed)
Pt states she head butted her great dane dog.  Pt has swelling around left eye.  No loc or vomiting.  [facility] and sent to er for eval of orbital fx and head bleed.

## 2015-02-27 ENCOUNTER — Other Ambulatory Visit: Payer: Self-pay | Admitting: Neurology

## 2015-03-15 ENCOUNTER — Ambulatory Visit: Payer: Medicare Other | Admitting: Neurology

## 2015-04-12 DIAGNOSIS — E039 Hypothyroidism, unspecified: Secondary | ICD-10-CM | POA: Diagnosis not present

## 2015-04-12 DIAGNOSIS — M797 Fibromyalgia: Secondary | ICD-10-CM | POA: Diagnosis not present

## 2015-04-12 DIAGNOSIS — E559 Vitamin D deficiency, unspecified: Secondary | ICD-10-CM | POA: Diagnosis not present

## 2015-04-12 DIAGNOSIS — K219 Gastro-esophageal reflux disease without esophagitis: Secondary | ICD-10-CM | POA: Diagnosis not present

## 2015-04-12 DIAGNOSIS — Z8673 Personal history of transient ischemic attack (TIA), and cerebral infarction without residual deficits: Secondary | ICD-10-CM | POA: Diagnosis not present

## 2015-04-12 DIAGNOSIS — J4520 Mild intermittent asthma, uncomplicated: Secondary | ICD-10-CM | POA: Diagnosis not present

## 2015-04-12 DIAGNOSIS — R0789 Other chest pain: Secondary | ICD-10-CM | POA: Diagnosis not present

## 2015-04-12 DIAGNOSIS — E785 Hyperlipidemia, unspecified: Secondary | ICD-10-CM | POA: Diagnosis not present

## 2015-04-15 ENCOUNTER — Ambulatory Visit (INDEPENDENT_AMBULATORY_CARE_PROVIDER_SITE_OTHER): Payer: Medicare Other | Admitting: Cardiology

## 2015-04-15 ENCOUNTER — Encounter: Payer: Self-pay | Admitting: Cardiology

## 2015-04-15 VITALS — BP 130/76 | HR 71 | Ht 62.5 in | Wt 161.5 lb

## 2015-04-15 DIAGNOSIS — R0789 Other chest pain: Secondary | ICD-10-CM | POA: Insufficient documentation
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
## 2015-04-15 NOTE — Patient Instructions (Signed)
Your physician recommends that you schedule a follow-up appointment As Needed  Your physician has requested that you have an exercise tolerance test. For further information please visit [email or web address]. Please also follow instruction sheet, as given.

## 2015-04-27 NOTE — Addendum Note (Signed)
Addended by: Vennie Homans on: 04/27/2015 10:12 AM   Modules accepted: Orders

## 2015-04-29 ENCOUNTER — Other Ambulatory Visit: Payer: Self-pay | Admitting: Neurology

## 2015-04-29 NOTE — Telephone Encounter (Signed)
Rx sent 

## 2015-05-04 ENCOUNTER — Telehealth (HOSPITAL_COMMUNITY): Payer: Self-pay | Admitting: Radiology

## 2015-05-04 NOTE — Telephone Encounter (Signed)
Encounter complete. 

## 2015-05-06 ENCOUNTER — Ambulatory Visit (HOSPITAL_COMMUNITY)
Admission: RE | Admit: 2015-05-06 | Discharge: 2015-05-06 | Disposition: A | Discharge status: Home / Self Care | Payer: Medicare Other | Source: Ambulatory Visit | Attending: Urology | Admitting: Urology

## 2015-05-06 DIAGNOSIS — R0600 Dyspnea, unspecified: Secondary | ICD-10-CM | POA: Insufficient documentation

## 2015-05-06 DIAGNOSIS — R0789 Other chest pain: Secondary | ICD-10-CM | POA: Insufficient documentation

## 2015-05-06 LAB — EXERCISE TOLERANCE TEST
Estimated workload: 7.3 METS
Exercise duration (min): 6 min
Exercise duration (sec): 13 s
MPHR: 149 {beats}/min
Peak HR: 123 {beats}/min
Percent HR: 82 %
RPE: 16
Rest HR: 73 {beats}/min

## 2015-05-10 ENCOUNTER — Telehealth: Payer: Self-pay | Admitting: *Deleted

## 2015-05-10 DIAGNOSIS — R0789 Other chest pain: Secondary | ICD-10-CM

## 2015-05-10 DIAGNOSIS — R0602 Shortness of breath: Secondary | ICD-10-CM

## 2015-05-10 DIAGNOSIS — R5383 Other fatigue: Secondary | ICD-10-CM

## 2015-05-10 NOTE — Telephone Encounter (Signed)
Spoke with patient about her blood work, told her the test was inadequate, pt stated she is have SOB and extreme fatigue, lexiscan Myoview was ordered.

## 2015-05-10 NOTE — Telephone Encounter (Signed)
-----   Message from Minus Breeding, MD sent at 05/07/2015 12:18 PM EDT ----- This was not an adequate test because she didn't get to the target heart rate.  However, if she has no further symptoms then no further work up is indicated.  She needs to let me know if she has had complete resolution of any symptoms.  If not she would need a [employer].

## 2015-05-18 ENCOUNTER — Encounter: Payer: Self-pay | Admitting: Neurology

## 2015-05-18 ENCOUNTER — Ambulatory Visit (INDEPENDENT_AMBULATORY_CARE_PROVIDER_SITE_OTHER): Payer: Medicare Other | Admitting: Neurology

## 2015-05-18 VITALS — BP 132/72 | HR 81 | Temp 98.1°F | Resp 17 | Ht 62.5 in | Wt 166.8 lb

## 2015-05-18 DIAGNOSIS — G43809 Other migraine, not intractable, without status migrainosus: Secondary | ICD-10-CM

## 2015-05-18 DIAGNOSIS — G43009 Migraine without aura, not intractable, without status migrainosus: Secondary | ICD-10-CM
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
## 2015-05-18 NOTE — Patient Instructions (Signed)
Continue Cymbalta Use the Excedrin Migraine as needed (limited to no more than 2 days per week Follow up in 6 months.

## 2015-05-19 ENCOUNTER — Telehealth (HOSPITAL_COMMUNITY): Payer: Self-pay

## 2015-05-19 NOTE — Telephone Encounter (Signed)
Encounter complete. 

## 2015-05-24 ENCOUNTER — Inpatient Hospital Stay (HOSPITAL_COMMUNITY): Admission: RE | Admit: 2015-05-24 | Payer: Medicare Other | Source: Ambulatory Visit

## 2015-05-25 ENCOUNTER — Telehealth (HOSPITAL_COMMUNITY): Payer: Self-pay

## 2015-05-25 NOTE — Telephone Encounter (Signed)
Encounter complete. 

## 2015-05-27 ENCOUNTER — Ambulatory Visit (HOSPITAL_COMMUNITY)
Admission: RE | Admit: 2015-05-27 | Discharge: 2015-05-27 | Disposition: A | Discharge status: Home / Self Care | Payer: Medicare Other | Source: Ambulatory Visit | Attending: Cardiology | Admitting: Cardiology

## 2015-05-27 DIAGNOSIS — E785 Hyperlipidemia, unspecified: Secondary | ICD-10-CM | POA: Insufficient documentation

## 2015-05-27 DIAGNOSIS — R0789 Other chest pain: Secondary | ICD-10-CM

## 2015-05-27 DIAGNOSIS — R0609 Other forms of dyspnea: Secondary | ICD-10-CM | POA: Insufficient documentation

## 2015-05-27 DIAGNOSIS — R0602 Shortness of breath: Secondary | ICD-10-CM | POA: Insufficient documentation

## 2015-05-27 DIAGNOSIS — Z8249 Family history of ischemic heart disease and other diseases of the circulatory system: Secondary | ICD-10-CM | POA: Insufficient documentation

## 2015-05-27 DIAGNOSIS — Z87891 Personal history of nicotine dependence: Secondary | ICD-10-CM | POA: Diagnosis not present

## 2015-05-27 DIAGNOSIS — R9439 Abnormal result of other cardiovascular function study: Secondary | ICD-10-CM | POA: Insufficient documentation

## 2015-05-27 DIAGNOSIS — R5383 Other fatigue: Secondary | ICD-10-CM | POA: Diagnosis not present

## 2015-05-27 LAB — MYOCARDIAL PERFUSION IMAGING
Estimated workload: 1 METS
LV dias vol: 47 mL
LV sys vol: 10 mL
Peak BP: 142 mmHg
Peak HR: 63 {beats}/min
Percent of predicted max HR: 42 %
Rest HR: 61 {beats}/min
SDS: 3
SRS: 6
SSS: 7
Stage 1 DBP: 76 mmHg
Stage 1 Grade: 0 %
Stage 1 HR: 64 {beats}/min
Stage 1 SBP: 140 mmHg
Stage 1 Speed: 0 mph
Stage 2 Grade: 0 %
Stage 2 HR: 64 {beats}/min
Stage 2 Speed: 0 mph
Stage 3 DBP: 89 mmHg
Stage 3 Grade: 0 %
Stage 3 HR: 63 {beats}/min
Stage 3 SBP: 142 mmHg
Stage 3 Speed: 0 mph
Stage 4 DBP: 75 mmHg
Stage 4 Grade: 0 %
Stage 4 HR: 62 {beats}/min
Stage 4 SBP: 129 mmHg
Stage 4 Speed: 0 mph
TID: 0.98

## 2015-05-27 MED ORDER — REGADENOSON 0.4 MG/5ML IV SOLN
0.4000 mg | Freq: Once | INTRAVENOUS | Status: AC
  Administered 2015-05-27: 0.4 mg via INTRAVENOUS

## 2015-05-27 MED ORDER — TECHNETIUM TC 99M SESTAMIBI GENERIC - CARDIOLITE
10.2000 | Freq: Once | INTRAVENOUS | Status: AC | PRN
  Administered 2015-05-27: 10.6 via INTRAVENOUS

## 2015-05-27 MED ORDER — TECHNETIUM TC 99M SESTAMIBI GENERIC - CARDIOLITE
27.9000 | Freq: Once | INTRAVENOUS | Status: AC | PRN
  Administered 2015-05-27: 29.1 via INTRAVENOUS

## 2015-06-14 ENCOUNTER — Ambulatory Visit (INDEPENDENT_AMBULATORY_CARE_PROVIDER_SITE_OTHER): Payer: Medicare Other | Admitting: General Surgery

## 2015-06-14 ENCOUNTER — Ambulatory Visit: Payer: Medicare Other

## 2015-06-14 ENCOUNTER — Other Ambulatory Visit: Payer: Self-pay

## 2015-06-14 ENCOUNTER — Encounter: Payer: Self-pay | Admitting: General Surgery

## 2015-06-14 VITALS — BP 138/70 | HR 76 | Resp 14 | Ht 62.5 in | Wt 166.0 lb

## 2015-06-14 DIAGNOSIS — N63 Unspecified lump in breast: Secondary | ICD-10-CM | POA: Diagnosis not present

## 2015-06-14 DIAGNOSIS — N6320 Unspecified lump in the left breast, unspecified quadrant: Secondary | ICD-10-CM

## 2015-06-14 DIAGNOSIS — Z800 Family history of malignant neoplasm of digestive organs: Secondary | ICD-10-CM | POA: Diagnosis not present

## 2015-06-14 MED ORDER — POLYETHYLENE GLYCOL 3350 17 GM/SCOOP PO POWD: 1.0000 | Freq: Once | ORAL | Status: DC

## 2015-06-14 NOTE — Progress Notes (Signed)
The patient is scheduled for a colonoscopy at [facility] on 08/03/15. She will stop her Krill Oil 1 week prior and stop her Plavix 5 days prior. Miralax prescription has been sent into her pharmacy. The patient is aware of date and instructions.
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
## 2015-06-14 NOTE — Patient Instructions (Signed)
.  Colonoscopy A colonoscopy is an exam to look at the entire large intestine (colon). This exam can help find problems such as tumors, polyps, inflammation, and areas of bleeding. The exam takes about 1 hour.  LET YOUR HEALTH CARE PROVIDER KNOW ABOUT:   Any allergies you have.  All medicines you are taking, including vitamins, herbs, eye drops, creams, and over-the-counter medicines.  Previous problems you or members of your family have had with the use of anesthetics.  Any blood disorders you have.  Previous surgeries you have had.  Medical conditions you have. RISKS AND COMPLICATIONS  Generally, this is a safe procedure. However, as with any procedure, complications can occur. Possible complications include:  Bleeding.  Tearing or rupture of the colon wall.  Reaction to medicines given during the exam.  Infection (rare). BEFORE THE PROCEDURE   Ask your health care provider about changing or stopping your regular medicines.  You may be prescribed an oral bowel prep. This involves drinking a large amount of medicated liquid, starting the day before your procedure. The liquid will cause you to have multiple loose stools until your stool is almost clear or light green. This cleans out your colon in preparation for the procedure.  Do not eat or drink anything else once you have started the bowel prep, unless your health care provider tells you it is safe to do so.  Arrange for someone to drive you home after the procedure. PROCEDURE   You will be given medicine to help you relax (sedative).  You will lie on your side with your knees bent.  A long, flexible tube with a light and camera on the end (colonoscope) will be inserted through the rectum and into the colon. The camera sends video back to a computer screen as it moves through the colon. The colonoscope also releases carbon dioxide gas to inflate the colon. This helps your health care provider see the area better.  During  the exam, your health care provider may take a small tissue sample (biopsy) to be examined under a microscope if any abnormalities are found.  The exam is finished when the entire colon has been viewed. AFTER THE PROCEDURE   Do not drive for 24 hours after the exam.  You may have a small amount of blood in your stool.  You may pass moderate amounts of gas and have mild abdominal cramping or bloating. This is caused by the gas used to inflate your colon during the exam.  Ask when your test results will be ready and how you will get your results. Make sure you get your test results.   This information is not intended to replace advice given to you by your health care provider. Make sure you discuss any questions you have with your health care provider.   Document Released: 08/17/2000 Document Revised: 06/10/2013 Document Reviewed: 04/27/2013 Elsevier Interactive Patient Education 2016 Elsevier Inc.  

## 2015-06-15 NOTE — Addendum Note (Signed)
Addended by: Christene Lye on: 06/15/2015 03:05 PM   Modules accepted: Orders

## 2015-06-27 ENCOUNTER — Other Ambulatory Visit: Payer: Self-pay | Admitting: Neurology

## 2015-06-27 DIAGNOSIS — G43009 Migraine without aura, not intractable, without status migrainosus: Secondary | ICD-10-CM

## 2015-06-27 NOTE — Telephone Encounter (Signed)
Last OV: 9/13.  Next OV: 3/17

## 2015-07-25 DIAGNOSIS — M791 Myalgia: Secondary | ICD-10-CM | POA: Diagnosis not present

## 2015-07-25 DIAGNOSIS — M7061 Trochanteric bursitis, right hip: Secondary | ICD-10-CM | POA: Diagnosis not present

## 2015-07-25 DIAGNOSIS — M545 Low back pain: Secondary | ICD-10-CM | POA: Diagnosis not present

## 2015-08-02 ENCOUNTER — Telehealth: Payer: Self-pay

## 2015-08-02 NOTE — Telephone Encounter (Signed)
Patient called and states that she has been sick since before Thanksgiving. She would like to cancel her colonoscopy scheduled for 08/03/15. Patient is now scheduled for a Colonoscopy at [facility] on 09/14/15. She is aware to stop her Krill Oil 1 week prior and her Plavix 5 days prior. Patient is aware of date and instructions. Trish in Endo notified of change.

## 2015-09-06 DIAGNOSIS — F4310 Post-traumatic stress disorder, unspecified: Secondary | ICD-10-CM | POA: Diagnosis not present

## 2015-09-06 DIAGNOSIS — F332 Major depressive disorder, recurrent severe without psychotic features: Secondary | ICD-10-CM | POA: Diagnosis not present

## 2015-09-06 DIAGNOSIS — F0631 Mood disorder due to known physiological condition with depressive features: Secondary | ICD-10-CM | POA: Diagnosis not present

## 2015-09-09 DIAGNOSIS — R7301 Impaired fasting glucose: Secondary | ICD-10-CM | POA: Diagnosis not present

## 2015-09-09 DIAGNOSIS — M797 Fibromyalgia: Secondary | ICD-10-CM | POA: Diagnosis not present

## 2015-09-09 DIAGNOSIS — E559 Vitamin D deficiency, unspecified: Secondary | ICD-10-CM | POA: Diagnosis not present

## 2015-09-09 DIAGNOSIS — E785 Hyperlipidemia, unspecified: Secondary | ICD-10-CM | POA: Diagnosis not present

## 2015-09-09 DIAGNOSIS — Z23 Encounter for immunization: Secondary | ICD-10-CM | POA: Diagnosis not present

## 2015-09-09 DIAGNOSIS — E039 Hypothyroidism, unspecified: Secondary | ICD-10-CM | POA: Diagnosis not present

## 2015-09-13 ENCOUNTER — Telehealth: Payer: Self-pay

## 2015-09-13 NOTE — Telephone Encounter (Signed)
Patient called and states that she has been sick with a cold for the past 4 days. She reports a fever 2 days ago and now has some chest congestion. Patient would like to rescheduled her colonoscopy scheduled for 09/14/15. Patient is rescheduled for her colonoscopy for 09/28/15. She is aware to stop her Plavix 5 days prior and she will remain off of her Krill Oil until after her colonoscopy is completed. Patient is aware of date and instructions. Trish with endoscopy has been notified of the date change.

## 2015-09-27 ENCOUNTER — Encounter: Payer: Self-pay | Admitting: *Deleted

## 2015-09-28 ENCOUNTER — Ambulatory Visit
Admission: RE | Admit: 2015-09-28 | Discharge: 2015-09-28 | Disposition: A | Discharge status: Home / Self Care | Payer: Medicare Other | Source: Ambulatory Visit | Attending: General Surgery | Admitting: General Surgery

## 2015-09-28 ENCOUNTER — Ambulatory Visit: Payer: Medicare Other | Admitting: Anesthesiology

## 2015-09-28 ENCOUNTER — Encounter
Admission: RE | Disposition: A | Discharge status: Home / Self Care | Payer: Self-pay | Source: Ambulatory Visit | Attending: General Surgery

## 2015-09-28 ENCOUNTER — Encounter: Payer: Self-pay | Admitting: *Deleted

## 2015-09-28 DIAGNOSIS — M797 Fibromyalgia: Secondary | ICD-10-CM | POA: Insufficient documentation

## 2015-09-28 DIAGNOSIS — M8580 Other specified disorders of bone density and structure, unspecified site: Secondary | ICD-10-CM | POA: Diagnosis not present

## 2015-09-28 DIAGNOSIS — E7800 Pure hypercholesterolemia, unspecified: Secondary | ICD-10-CM | POA: Diagnosis not present

## 2015-09-28 DIAGNOSIS — Z85828 Personal history of other malignant neoplasm of skin: Secondary | ICD-10-CM | POA: Insufficient documentation

## 2015-09-28 DIAGNOSIS — Z1211 Encounter for screening for malignant neoplasm of colon: Secondary | ICD-10-CM | POA: Insufficient documentation

## 2015-09-28 DIAGNOSIS — M1990 Unspecified osteoarthritis, unspecified site: Secondary | ICD-10-CM | POA: Insufficient documentation

## 2015-09-28 DIAGNOSIS — Z79899 Other long term (current) drug therapy: Secondary | ICD-10-CM | POA: Diagnosis not present

## 2015-09-28 DIAGNOSIS — K644 Residual hemorrhoidal skin tags: Secondary | ICD-10-CM | POA: Diagnosis not present

## 2015-09-28 DIAGNOSIS — J45909 Unspecified asthma, uncomplicated: Secondary | ICD-10-CM | POA: Diagnosis not present

## 2015-09-28 DIAGNOSIS — F329 Major depressive disorder, single episode, unspecified: Secondary | ICD-10-CM | POA: Insufficient documentation

## 2015-09-28 DIAGNOSIS — Z87891 Personal history of nicotine dependence: Secondary | ICD-10-CM | POA: Insufficient documentation

## 2015-09-28 DIAGNOSIS — Z7902 Long term (current) use of antithrombotics/antiplatelets: Secondary | ICD-10-CM | POA: Diagnosis not present

## 2015-09-28 DIAGNOSIS — Z882 Allergy status to sulfonamides status: Secondary | ICD-10-CM | POA: Insufficient documentation

## 2015-09-28 DIAGNOSIS — Z888 Allergy status to other drugs, medicaments and biological substances status: Secondary | ICD-10-CM | POA: Diagnosis not present

## 2015-09-28 DIAGNOSIS — F4310 Post-traumatic stress disorder, unspecified: Secondary | ICD-10-CM | POA: Insufficient documentation

## 2015-09-28 DIAGNOSIS — E039 Hypothyroidism, unspecified: Secondary | ICD-10-CM | POA: Diagnosis not present

## 2015-09-28 DIAGNOSIS — Z881 Allergy status to other antibiotic agents status: Secondary | ICD-10-CM | POA: Diagnosis not present

## 2015-09-28 DIAGNOSIS — Z800 Family history of malignant neoplasm of digestive organs: Secondary | ICD-10-CM | POA: Diagnosis not present

## 2015-09-28 DIAGNOSIS — Z8673 Personal history of transient ischemic attack (TIA), and cerebral infarction without residual deficits: Secondary | ICD-10-CM | POA: Insufficient documentation

## 2015-09-28 DIAGNOSIS — K589 Irritable bowel syndrome without diarrhea: Secondary | ICD-10-CM | POA: Diagnosis not present

## 2015-09-28 HISTORY — PX: COLONOSCOPY WITH PROPOFOL: SHX5780

## 2015-09-28 SURGERY — COLONOSCOPY WITH PROPOFOL
Anesthesia: General

## 2015-09-28 MED ORDER — MIDAZOLAM HCL 2 MG/2ML IJ SOLN
INTRAMUSCULAR | Status: DC | PRN
  Administered 2015-09-28: 1 mg via INTRAVENOUS

## 2015-09-28 MED ORDER — LIDOCAINE HCL (CARDIAC) 20 MG/ML IV SOLN
INTRAVENOUS | Status: DC | PRN
  Administered 2015-09-28: 40 mg via INTRAVENOUS

## 2015-09-28 MED ORDER — LIDOCAINE HCL 2 % EX GEL
CUTANEOUS | Status: DC | PRN
  Administered 2015-09-28: 1 via TOPICAL

## 2015-09-28 MED ORDER — SODIUM CHLORIDE 0.9 % IV SOLN
INTRAVENOUS | Status: DC
  Administered 2015-09-28: 11:00:00 via INTRAVENOUS

## 2015-09-28 MED ORDER — PROPOFOL 500 MG/50ML IV EMUL
INTRAVENOUS | Status: DC | PRN
  Administered 2015-09-28: 100 ug/kg/min via INTRAVENOUS

## 2015-09-28 MED ORDER — FENTANYL CITRATE (PF) 100 MCG/2ML IJ SOLN
INTRAMUSCULAR | Status: DC | PRN
  Administered 2015-09-28: 50 ug via INTRAVENOUS

## 2015-09-28 MED ORDER — PROPOFOL 10 MG/ML IV BOLUS
INTRAVENOUS | Status: DC | PRN
  Administered 2015-09-28: 50 mg via INTRAVENOUS

## 2015-09-28 NOTE — Transfer of Care (Signed)
Immediate Anesthesia Transfer of Care Note  Patient: Nichole [name]  Procedure(s) Performed: Procedure(s): COLONOSCOPY WITH PROPOFOL (N/A)  Patient Location: PACU and Endoscopy Unit  Anesthesia Type:General  Level of Consciousness: sedated  Airway & Oxygen Therapy: Patient Spontanous Breathing and Patient connected to nasal cannula oxygen  Post-op Assessment: Report given to RN and Post -op Vital signs reviewed and stable  Post vital signs: Reviewed and stable  Last Vitals:  Filed Vitals:   09/28/15 1038 09/28/15 1305  BP: 128/67 117/51  Pulse: 65   Temp: 35.9 C 36.3 C  Resp: 16 12    Complications: No apparent anesthesia complications

## 2015-09-28 NOTE — Anesthesia Procedure Notes (Signed)
Date/Time: 09/28/2015 12:33 PM Performed by: Doreen Salvage Pre-anesthesia Checklist: Patient identified, Emergency Drugs available, Suction available and Patient being monitored Patient Re-evaluated:Patient Re-evaluated prior to inductionOxygen Delivery Method: Nasal cannula Intubation Type: IV induction Dental Injury: Teeth and Oropharynx as per pre-operative assessment  Comments: Nasal cannula with etCO2 monitoring

## 2015-09-28 NOTE — Op Note (Signed)
[facility] Patient Name: Nichole [name] Procedure Date: 09/28/2015 12:26 PM MRN: [id number] Account #: [id number] Date of Birth: 05-17-1941 Admit Type: Outpatient Age: 75 Room: [facility] ENDO ROOM 1 Gender: Female Note Status: Finalized Procedure:         Colonoscopy Indications:       Family history of colon cancer in a first-degree relative Providers:         Seeplaputhur G. Jamal Collin, MD Medicines:         General Anesthesia Complications:     No immediate complications. Procedure:         Pre-Anesthesia Assessment:                    - General anesthesia was determined to be medically                     necessary for this procedure based on review of the                     patient's medical history, medications, and prior                     anesthesia history.                    - General anesthesia was determined to be medically                     necessary for this procedure based on review of the                     patient's medical history, medications, and prior                     anesthesia history.                    After obtaining informed consent, the colonoscope was                     passed under direct vision. Throughout the procedure, the                     patient's blood pressure, pulse, and oxygen saturations                     were monitored continuously. The Colonoscope was                     introduced through the anus and advanced to the the cecum,                     identified by the ileocecal valve. The quality of the                     bowel preparation was adequate to identify polyps 6 mm and                     larger in size. Findings:      The perianal exam findings include a skin tag.      The entire examined colon appeared normal on direct and retroflexion       views. Impression:        - Perianal skin tag found on perianal exam.                    -  The entire examined colon is normal on direct and                   retroflexion views.                    - No specimens collected. Recommendation:    - Discharge patient to home.                    - Repeat colonoscopy in 5 years for surveillance. Procedure Code(s): --- Professional ---                    [phone], Colonoscopy, flexible; diagnostic, including                     collection of specimen(s) by brushing or washing, when                     performed (separate procedure) Diagnosis Code(s): --- Professional ---                    K64.4, Residual hemorrhoidal skin tags                    Z80.0, Family history of malignant neoplasm of digestive                     organs CPT copyright 2014 American Medical Association. All rights reserved. The codes documented in this report are preliminary and upon coder review may  be revised to meet current compliance requirements. Christene Lye, MD 09/28/2015 12:58:44 PM This report has been signed electronically. Number of Addenda: 0 Note Initiated On: 09/28/2015 12:26 PM Scope Withdrawal Time: 0 hours 4 minutes 40 seconds  Total Procedure Duration: 0 hours 17 minutes 56 seconds       [facility]
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
## 2015-09-28 NOTE — Anesthesia Preprocedure Evaluation (Signed)
Anesthesia Evaluation  Patient identified by MRN, date of birth, ID band Patient awake    Reviewed: Allergy & Precautions, NPO status , Patient's Chart, lab work & pertinent test results  Airway Mallampati: III  TM Distance: <3 FB     Dental  (+) Chipped   Pulmonary asthma , former smoker,    Pulmonary exam normal breath sounds clear to auscultation       Cardiovascular Normal cardiovascular exam     Neuro/Psych  Headaches, Depression TIA   GI/Hepatic Neg liver ROS, Hx of IBS and Hx of rectal bleeding   Endo/Other  Hypothyroidism   Renal/GU negative Renal ROS     Musculoskeletal  (+) Arthritis , Osteoarthritis,  Fibromyalgia -  Abdominal Normal abdominal exam  (+)   Peds  Hematology negative hematology ROS (+)   Anesthesia Other Findings   Reproductive/Obstetrics                             Anesthesia Physical Anesthesia Plan  ASA: III  Anesthesia Plan: General   Post-op Pain Management:    Induction: Intravenous  Airway Management Planned: Nasal Cannula  Additional Equipment:   Intra-op Plan:   Post-operative Plan:   Informed Consent: I have reviewed the patients History and Physical, chart, labs and discussed the procedure including the risks, benefits and alternatives for the proposed anesthesia with the patient or authorized representative who has indicated his/her understanding and acceptance.   Dental advisory given  Plan Discussed with: CRNA and Surgeon  Anesthesia Plan Comments:         Anesthesia Quick Evaluation

## 2015-09-29 ENCOUNTER — Telehealth: Payer: Self-pay | Admitting: *Deleted

## 2015-09-29 NOTE — Anesthesia Postprocedure Evaluation (Signed)
Anesthesia Post Note  Patient: Nichole [name]  Procedure(s) Performed: Procedure(s) (LRB): COLONOSCOPY WITH PROPOFOL (N/A)  Patient location during evaluation: PACU Anesthesia Type: General Level of consciousness: awake and alert and oriented Pain management: pain level controlled Vital Signs Assessment: post-procedure vital signs reviewed and stable Respiratory status: spontaneous breathing Cardiovascular status: blood pressure returned to baseline Anesthetic complications: no    Last Vitals:  Filed Vitals:   09/28/15 1320 09/28/15 1330  BP: 121/54 98/68  Pulse: 60 61  Temp:    Resp: 11 17    Last Pain: There were no vitals filed for this visit.               Maruice Pieroni

## 2015-09-29 NOTE — Telephone Encounter (Signed)
She called to let us know that she was having some bloody clots /mucous coming up when she clears her throat since her colonoscopy yesterday. She denies having trouble breathing or swallowing. Advised to drink plenty of cool/cold water and may even apply an ice pack to her neck area for comfort. She will call back by lunch tomorrow with a status update. She is aware to seek medical attention if symptoms worsen, pt agrees.

## 2015-09-30 ENCOUNTER — Telehealth: Payer: Self-pay

## 2015-09-30 NOTE — Telephone Encounter (Signed)
Patient called back with an update. She states that her throat is doing better. She still reports occasional coughing up blood tinged mucus. She reports the pain is much improved. She will continue with the cool drinks and compresses. She will contact us if she has any further problems.

## 2015-10-05 ENCOUNTER — Encounter: Payer: Self-pay | Admitting: *Deleted

## 2015-10-16 ENCOUNTER — Encounter: Payer: Self-pay | Admitting: Emergency Medicine

## 2015-10-16 ENCOUNTER — Emergency Department: Payer: Medicare Other

## 2015-10-16 DIAGNOSIS — Z7902 Long term (current) use of antithrombotics/antiplatelets: Secondary | ICD-10-CM | POA: Diagnosis not present

## 2015-10-16 DIAGNOSIS — R1030 Lower abdominal pain, unspecified: Secondary | ICD-10-CM | POA: Diagnosis not present

## 2015-10-16 DIAGNOSIS — Z79899 Other long term (current) drug therapy: Secondary | ICD-10-CM | POA: Insufficient documentation

## 2015-10-16 DIAGNOSIS — N390 Urinary tract infection, site not specified: Secondary | ICD-10-CM | POA: Insufficient documentation

## 2015-10-16 DIAGNOSIS — R0789 Other chest pain: Secondary | ICD-10-CM | POA: Diagnosis not present

## 2015-10-16 DIAGNOSIS — R140 Abdominal distension (gaseous): Secondary | ICD-10-CM | POA: Diagnosis not present

## 2015-10-16 DIAGNOSIS — Z87891 Personal history of nicotine dependence: Secondary | ICD-10-CM | POA: Insufficient documentation

## 2015-10-16 DIAGNOSIS — M549 Dorsalgia, unspecified: Secondary | ICD-10-CM | POA: Diagnosis present

## 2015-10-16 DIAGNOSIS — R0602 Shortness of breath: Secondary | ICD-10-CM | POA: Diagnosis not present

## 2015-10-16 LAB — URINALYSIS COMPLETE WITH MICROSCOPIC (ARMC ONLY)
Bacteria, UA: NONE SEEN
Bilirubin Urine: NEGATIVE
Glucose, UA: NEGATIVE mg/dL
Hgb urine dipstick: NEGATIVE
Ketones, ur: NEGATIVE mg/dL
Nitrite: NEGATIVE
Protein, ur: 30 mg/dL — AB
Specific Gravity, Urine: 1.027 (ref 1.005–1.030)
pH: 5 (ref 5.0–8.0)

## 2015-10-16 LAB — BASIC METABOLIC PANEL
BUN: 14 mg/dL (ref 6–20)
CO2: 27 mmol/L (ref 22–32)
Calcium: 9 mg/dL (ref 8.9–10.3)
Chloride: 106 mmol/L (ref 101–111)
Creatinine, Ser: 0.79 mg/dL (ref 0.44–1.00)
GFR calc Af Amer: >60 mL/min (ref >60)
Glucose, Bld: 93 mg/dL (ref 65–99)

## 2015-10-16 LAB — TROPONIN I: Troponin I: 0.03 ng/mL (ref <0.031)

## 2015-10-16 LAB — CBC
HCT: 38.7 % (ref 35.0–47.0)
Hemoglobin: 13.2 g/dL (ref 12.0–16.0)
MCH: 31.2 pg (ref 26.0–34.0)
MCHC: 34.2 g/dL (ref 32.0–36.0)
MCV: 91.2 fL (ref 80.0–100.0)
Platelets: 239 10*3/uL (ref 150–440)
RBC: 4.25 MIL/uL (ref 3.80–5.20)
RDW: 13.5 % (ref 11.5–14.5)
WBC: 8.2 K/uL (ref 3.6–11.0)

## 2015-10-16 LAB — BASIC METABOLIC PANEL WITH GFR
Anion gap: 6 (ref 5–15)
GFR calc non Af Amer: >60 mL/min (ref >60)
Potassium: 3.8 mmol/L (ref 3.5–5.1)
Sodium: 139 mmol/L (ref 135–145)

## 2015-10-16 NOTE — ED Notes (Signed)
Pt. States abdominal pain upper back pain with chills that started a couple of days ago.  Pt. States generalized chest pain that started today.  Pt. Denies cardiopulmonary hx.

## 2015-10-17 ENCOUNTER — Emergency Department
Admission: EM | Admit: 2015-10-17 | Discharge: 2015-10-17 | Disposition: A | Discharge status: Home / Self Care | Payer: Medicare Other | Attending: Emergency Medicine | Admitting: Emergency Medicine

## 2015-10-17 DIAGNOSIS — N390 Urinary tract infection, site not specified: Secondary | ICD-10-CM | POA: Diagnosis not present

## 2015-10-17 DIAGNOSIS — R140 Abdominal distension (gaseous): Secondary | ICD-10-CM

## 2015-10-17 DIAGNOSIS — R1030 Lower abdominal pain, unspecified: Secondary | ICD-10-CM

## 2015-10-17 DIAGNOSIS — R0789 Other chest pain: Secondary | ICD-10-CM

## 2015-10-17 LAB — TROPONIN I: Troponin I: <0.03 ng/mL (ref <0.031)

## 2015-10-17 MED ORDER — LEVALBUTEROL TARTRATE 45 MCG/ACT IN AERO: 2.0000 | INHALATION_SPRAY | RESPIRATORY_TRACT | Status: DC | PRN

## 2015-10-17 MED ORDER — NITROFURANTOIN MONOHYD MACRO 100 MG PO CAPS: 100.0000 mg | ORAL_CAPSULE | Freq: Two times a day (BID) | ORAL | Status: DC

## 2015-10-17 MED ORDER — NITROFURANTOIN MONOHYD MACRO 100 MG PO CAPS
100.0000 mg | ORAL_CAPSULE | Freq: Once | ORAL | Status: AC
  Administered 2015-10-17: 100 mg via ORAL
  Filled 2015-10-17: qty 1

## 2015-10-17 MED ORDER — ALBUTEROL SULFATE (2.5 MG/3ML) 0.083% IN NEBU
2.5000 mg | INHALATION_SOLUTION | Freq: Once | RESPIRATORY_TRACT | Status: AC
  Administered 2015-10-17: 2.5 mg via RESPIRATORY_TRACT
  Filled 2015-10-17: qty 3

## 2015-10-17 MED ORDER — ONDANSETRON 4 MG PO TBDP
4.0000 mg | ORAL_TABLET | Freq: Once | ORAL | Status: AC
  Administered 2015-10-17: 4 mg via ORAL
  Filled 2015-10-17: qty 1

## 2015-10-17 NOTE — Discharge Instructions (Signed)
1. Take antibiotic as prescribed (Macrobid 100 mg twice daily 7 days). 2. You may use Xopenex inhaler 2 puffs every 4 hours as needed for breathing difficulty. 3. Bland diet times one week until seen by your GI doctor. 4. Return to the ER or worsening symptoms, persistent vomiting, difficulty breathing or other concerns.  Abdominal Pain, Adult Many things can cause abdominal pain. Usually, abdominal pain is not caused by a disease and will improve without treatment. It can often be observed and treated at home. Your health care provider will do a physical exam and possibly order blood tests and X-rays to help determine the seriousness of your pain. However, in many cases, more time must pass before a clear cause of the pain can be found. Before that point, your health care provider may not know if you need more testing or further treatment. HOME CARE INSTRUCTIONS Monitor your abdominal pain for any changes. The following actions may help to alleviate any discomfort you are experiencing:  Only take over-the-counter or prescription medicines as directed by your health care provider.  Do not take laxatives unless directed to do so by your health care provider.  Try a clear liquid diet (broth, tea, or water) as directed by your health care provider. Slowly move to a bland diet as tolerated. SEEK MEDICAL CARE IF:  You have unexplained abdominal pain.  You have abdominal pain associated with nausea or diarrhea.  You have pain when you urinate or have a bowel movement.  You experience abdominal pain that wakes you in the night.  You have abdominal pain that is worsened or improved by eating food.  You have abdominal pain that is worsened with eating fatty foods.  You have a fever. SEEK IMMEDIATE MEDICAL CARE IF:  Your pain does not go away within 2 hours.  You keep throwing up (vomiting).  Your pain is felt only in portions of the abdomen, such as the right side or the left lower  portion of the abdomen.  You pass bloody or black tarry stools. MAKE SURE YOU:  Understand these instructions.  Will watch your condition.  Will get help right away if you are not doing well or get worse.   This information is not intended to replace advice given to you by your health care provider. Make sure you discuss any questions you have with your health care provider.   Document Released: 05/30/2005 Document Revised: 05/11/2015 Document Reviewed: 04/29/2013 Elsevier Interactive Patient Education 2016 Elsevier Inc[employer]  Urinary Tract Infection Urinary tract infections (UTIs) can develop anywhere along your urinary tract. Your urinary tract is your body's drainage system for removing wastes and extra water. Your urinary tract includes two kidneys, two ureters, a bladder, and a urethra. Your kidneys are a pair of bean-shaped organs. Each kidney is about the size of your fist. They are located below your ribs, one on each side of your spine. CAUSES Infections are caused by microbes, which are microscopic organisms, including fungi, viruses, and bacteria. These organisms are so small that they can only be seen through a microscope. Bacteria are the microbes that most commonly cause UTIs. SYMPTOMS  Symptoms of UTIs may vary by age and gender of the patient and by the location of the infection. Symptoms in young women typically include a frequent and intense urge to urinate and a painful, burning feeling in the bladder or urethra during urination. Older women and men are more likely to be tired, shaky, and weak and have muscle aches  and abdominal pain. A fever may mean the infection is in your kidneys. Other symptoms of a kidney infection include pain in your back or sides below the ribs, nausea, and vomiting. DIAGNOSIS To diagnose a UTI, your caregiver will ask you about your symptoms. Your caregiver will also ask you to provide a urine sample. The urine sample will be tested for bacteria and  white blood cells. White blood cells are made by your body to help fight infection. TREATMENT  Typically, UTIs can be treated with medication. Because most UTIs are caused by a bacterial infection, they usually can be treated with the use of antibiotics. The choice of antibiotic and length of treatment depend on your symptoms and the type of bacteria causing your infection. HOME CARE INSTRUCTIONS  If you were prescribed antibiotics, take them exactly as your caregiver instructs you. Finish the medication even if you feel better after you have only taken some of the medication.  Drink enough water and fluids to keep your urine clear or pale yellow.  Avoid caffeine, tea, and carbonated beverages. They tend to irritate your bladder.  Empty your bladder often. Avoid holding urine for long periods of time.  Empty your bladder before and after sexual intercourse.  After a bowel movement, women should cleanse from front to back. Use each tissue only once. SEEK MEDICAL CARE IF:   You have back pain.  You develop a fever.  Your symptoms do not begin to resolve within 3 days. SEEK IMMEDIATE MEDICAL CARE IF:   You have severe back pain or lower abdominal pain.  You develop chills.  You have nausea or vomiting.  You have continued burning or discomfort with urination. MAKE SURE YOU:   Understand these instructions.  Will watch your condition.  Will get help right away if you are not doing well or get worse.   This information is not intended to replace advice given to you by your health care provider. Make sure you discuss any questions you have with your health care provider.   Document Released: 05/30/2005 Document Revised: 05/11/2015 Document Reviewed: 09/28/2011 Elsevier Interactive Patient Education 2016 Elsevier Inc.  Nonspecific Chest Pain  Chest pain can be caused by many different conditions. There is always a chance that your pain could be related to something serious,  such as a heart attack or a blood clot in your lungs. Chest pain can also be caused by conditions that are not life-threatening. If you have chest pain, it is very important to follow up with your health care provider. CAUSES  Chest pain can be caused by:  Heartburn.  Pneumonia or bronchitis.  Anxiety or stress.  Inflammation around your heart (pericarditis) or lung (pleuritis or pleurisy).  A blood clot in your lung.  A collapsed lung (pneumothorax). It can develop suddenly on its own (spontaneous pneumothorax) or from trauma to the chest.  Shingles infection (varicella-zoster virus).  Heart attack.  Damage to the bones, muscles, and cartilage that make up your chest wall. This can include:  Bruised bones due to injury.  Strained muscles or cartilage due to frequent or repeated coughing or overwork.  Fracture to one or more ribs.  Sore cartilage due to inflammation (costochondritis). RISK FACTORS  Risk factors for chest pain may include:  Activities that increase your risk for trauma or injury to your chest.  Respiratory infections or conditions that cause frequent coughing.  Medical conditions or overeating that can cause heartburn.  Heart disease or family history of heart  disease.  Conditions or health behaviors that increase your risk of developing a blood clot.  Having had chicken pox (varicella zoster). SIGNS AND SYMPTOMS Chest pain can feel like:  Burning or tingling on the surface of your chest or deep in your chest.  Crushing, pressure, aching, or squeezing pain.  Dull or sharp pain that is worse when you move, cough, or take a deep breath.  Pain that is also felt in your back, neck, shoulder, or arm, or pain that spreads to any of these areas. Your chest pain may come and go, or it may stay constant. DIAGNOSIS Lab tests or other studies may be needed to find the cause of your pain. Your health care provider may have you take a test called an  ambulatory ECG (electrocardiogram). An ECG records your heartbeat patterns at the time the test is performed. You may also have other tests, such as:  Transthoracic echocardiogram (TTE). During echocardiography, sound waves are used to create a picture of all of the heart structures and to look at how blood flows through your heart.  Transesophageal echocardiogram (TEE).This is a more advanced imaging test that obtains images from inside your body. It allows your health care provider to see your heart in finer detail.  Cardiac monitoring. This allows your health care provider to monitor your heart rate and rhythm in real time.  Holter monitor. This is a portable device that records your heartbeat and can help to diagnose abnormal heartbeats. It allows your health care provider to track your heart activity for several days, if needed.  Stress tests. These can be done through exercise or by taking medicine that makes your heart beat more quickly.  Blood tests.  Imaging tests. TREATMENT  Your treatment depends on what is causing your chest pain. Treatment may include:  Medicines. These may include:  Acid blockers for heartburn.  Anti-inflammatory medicine.  Pain medicine for inflammatory conditions.  Antibiotic medicine, if an infection is present.  Medicines to dissolve blood clots.  Medicines to treat coronary artery disease.  Supportive care for conditions that do not require medicines. This may include:  Resting.  Applying heat or cold packs to injured areas.  Limiting activities until pain decreases. HOME CARE INSTRUCTIONS  If you were prescribed an antibiotic medicine, finish it all even if you start to feel better.  Avoid any activities that bring on chest pain.  Do not use any tobacco products, including cigarettes, chewing tobacco, or electronic cigarettes. If you need help quitting, ask your health care provider.  Do not drink alcohol.  Take medicines only as  directed by your health care provider.  Keep all follow-up visits as directed by your health care provider. This is important. This includes any further testing if your chest pain does not go away.  If heartburn is the cause for your chest pain, you may be told to keep your head raised (elevated) while sleeping. This reduces the chance that acid will go from your stomach into your esophagus.  Make lifestyle changes as directed by your health care provider. These may include:  Getting regular exercise. Ask your health care provider to suggest some activities that are safe for you.  Eating a heart-healthy diet. A registered dietitian can help you to learn healthy eating options.  Maintaining a healthy weight.  Managing diabetes, if necessary.  Reducing stress. SEEK MEDICAL CARE IF:  Your chest pain does not go away after treatment.  You have a rash with blisters on your  chest.  You have a fever. SEEK IMMEDIATE MEDICAL CARE IF:   Your chest pain is worse.  You have an increasing cough, or you cough up blood.  You have severe abdominal pain.  You have severe weakness.  You faint.  You have chills.  You have sudden, unexplained chest discomfort.  You have sudden, unexplained discomfort in your arms, back, neck, or jaw.  You have shortness of breath at any time.  You suddenly start to sweat, or your skin gets clammy.  You feel nauseous or you vomit.  You suddenly feel light-headed or dizzy.  Your heart begins to beat quickly, or it feels like it is skipping beats. These symptoms may represent a serious problem that is an emergency. Do not wait to see if the symptoms will go away. Get medical help right away. Call your local emergency services (911 in the [place] Do not drive yourself to the hospital.   This information is not intended to replace advice given to you by your health care provider. Make sure you discuss any questions you have with your health care  provider.   Document Released: 05/30/2005 Document Revised: 09/10/2014 Document Reviewed: 03/26/2014 Elsevier Interactive Patient Education [employer].
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
## 2015-10-21 DIAGNOSIS — R7301 Impaired fasting glucose: Secondary | ICD-10-CM | POA: Diagnosis not present

## 2015-11-11 DIAGNOSIS — J209 Acute bronchitis, unspecified: Secondary | ICD-10-CM | POA: Diagnosis not present

## 2015-11-16 ENCOUNTER — Ambulatory Visit (INDEPENDENT_AMBULATORY_CARE_PROVIDER_SITE_OTHER): Payer: Medicare Other | Admitting: Neurology

## 2015-11-16 ENCOUNTER — Encounter: Payer: Self-pay | Admitting: Neurology

## 2015-11-16 VITALS — BP 140/70 | HR 82 | Ht 62.5 in | Wt 161.0 lb

## 2015-11-16 DIAGNOSIS — G43009 Migraine without aura, not intractable, without status migrainosus: Secondary | ICD-10-CM | POA: Insufficient documentation

## 2015-11-16 DIAGNOSIS — G43809 Other migraine, not intractable, without status migrainosus: Secondary | ICD-10-CM

## 2015-11-16 DIAGNOSIS — G250 Essential tremor: Secondary | ICD-10-CM

## 2015-11-16 MED ORDER — DULOXETINE HCL 60 MG PO CPEP: 60.0000 mg | ORAL_CAPSULE | Freq: Every day | ORAL | Status: DC
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
## 2015-11-16 NOTE — Patient Instructions (Signed)
1.  Refilled duloxetine 60mg  daily for 3 month supply with refills 2.  Monitor tremor 3.  Follow up in 9 months.

## 2015-11-17 NOTE — Progress Notes (Signed)
Chart forwarded.  

## 2015-11-18 ENCOUNTER — Ambulatory Visit: Payer: Medicare Other | Admitting: Neurology

## 2015-11-18 DIAGNOSIS — Z86010 Personal history of colonic polyps: Secondary | ICD-10-CM | POA: Diagnosis not present

## 2015-11-18 DIAGNOSIS — R197 Diarrhea, unspecified: Secondary | ICD-10-CM | POA: Diagnosis not present

## 2015-11-18 DIAGNOSIS — K219 Gastro-esophageal reflux disease without esophagitis: Secondary | ICD-10-CM | POA: Diagnosis not present

## 2015-11-18 DIAGNOSIS — J209 Acute bronchitis, unspecified: Secondary | ICD-10-CM | POA: Diagnosis not present

## 2015-11-18 DIAGNOSIS — R0782 Intercostal pain: Secondary | ICD-10-CM | POA: Diagnosis not present

## 2015-11-18 DIAGNOSIS — R05 Cough: Secondary | ICD-10-CM | POA: Diagnosis not present

## 2015-11-18 DIAGNOSIS — R079 Chest pain, unspecified: Secondary | ICD-10-CM | POA: Diagnosis not present

## 2015-12-04 DIAGNOSIS — S46012A Strain of muscle(s) and tendon(s) of the rotator cuff of left shoulder, initial encounter: Secondary | ICD-10-CM | POA: Diagnosis not present

## 2015-12-13 DIAGNOSIS — M25512 Pain in left shoulder: Secondary | ICD-10-CM | POA: Diagnosis not present

## 2015-12-14 ENCOUNTER — Ambulatory Visit: Payer: Medicare Other | Admitting: General Surgery

## 2015-12-20 DIAGNOSIS — F4310 Post-traumatic stress disorder, unspecified: Secondary | ICD-10-CM | POA: Diagnosis not present

## 2015-12-20 DIAGNOSIS — F0631 Mood disorder due to known physiological condition with depressive features: Secondary | ICD-10-CM | POA: Diagnosis not present

## 2015-12-20 DIAGNOSIS — F332 Major depressive disorder, recurrent severe without psychotic features: Secondary | ICD-10-CM | POA: Diagnosis not present

## 2015-12-29 ENCOUNTER — Encounter: Payer: Self-pay | Admitting: *Deleted

## 2016-01-17 DIAGNOSIS — M25512 Pain in left shoulder: Secondary | ICD-10-CM | POA: Diagnosis not present

## 2016-02-14 DIAGNOSIS — M25512 Pain in left shoulder: Secondary | ICD-10-CM | POA: Diagnosis not present

## 2016-02-17 DIAGNOSIS — L255 Unspecified contact dermatitis due to plants, except food: Secondary | ICD-10-CM | POA: Diagnosis not present

## 2016-02-26 DIAGNOSIS — R030 Elevated blood-pressure reading, without diagnosis of hypertension: Secondary | ICD-10-CM | POA: Diagnosis not present

## 2016-02-26 DIAGNOSIS — N390 Urinary tract infection, site not specified: Secondary | ICD-10-CM | POA: Diagnosis not present

## 2016-02-27 DIAGNOSIS — F4310 Post-traumatic stress disorder, unspecified: Secondary | ICD-10-CM | POA: Diagnosis not present

## 2016-02-27 DIAGNOSIS — M797 Fibromyalgia: Secondary | ICD-10-CM | POA: Diagnosis not present

## 2016-02-27 DIAGNOSIS — K219 Gastro-esophageal reflux disease without esophagitis: Secondary | ICD-10-CM | POA: Diagnosis not present

## 2016-02-27 DIAGNOSIS — F324 Major depressive disorder, single episode, in partial remission: Secondary | ICD-10-CM | POA: Diagnosis not present

## 2016-02-27 DIAGNOSIS — Z8673 Personal history of transient ischemic attack (TIA), and cerebral infarction without residual deficits: Secondary | ICD-10-CM | POA: Diagnosis not present

## 2016-02-27 DIAGNOSIS — E785 Hyperlipidemia, unspecified: Secondary | ICD-10-CM | POA: Diagnosis not present

## 2016-02-27 DIAGNOSIS — E559 Vitamin D deficiency, unspecified: Secondary | ICD-10-CM | POA: Diagnosis not present

## 2016-02-27 DIAGNOSIS — E039 Hypothyroidism, unspecified: Secondary | ICD-10-CM | POA: Diagnosis not present

## 2016-02-27 DIAGNOSIS — J4520 Mild intermittent asthma, uncomplicated: Secondary | ICD-10-CM | POA: Diagnosis not present

## 2016-03-13 DIAGNOSIS — F331 Major depressive disorder, recurrent, moderate: Secondary | ICD-10-CM | POA: Diagnosis not present

## 2016-03-26 DIAGNOSIS — M533 Sacrococcygeal disorders, not elsewhere classified: Secondary | ICD-10-CM | POA: Diagnosis not present

## 2016-03-27 DIAGNOSIS — F331 Major depressive disorder, recurrent, moderate: Secondary | ICD-10-CM | POA: Diagnosis not present

## 2016-04-02 ENCOUNTER — Inpatient Hospital Stay
Admission: RE | Admit: 2016-04-02 | Discharge: 2016-04-02 | Disposition: A | Discharge status: Home / Self Care | Payer: Self-pay | Source: Ambulatory Visit | Attending: *Deleted | Admitting: *Deleted

## 2016-04-02 ENCOUNTER — Other Ambulatory Visit: Payer: Self-pay | Admitting: *Deleted

## 2016-04-02 DIAGNOSIS — Z9289 Personal history of other medical treatment: Secondary | ICD-10-CM

## 2016-04-11 DIAGNOSIS — F331 Major depressive disorder, recurrent, moderate: Secondary | ICD-10-CM | POA: Diagnosis not present

## 2016-04-11 DIAGNOSIS — L304 Erythema intertrigo: Secondary | ICD-10-CM | POA: Diagnosis not present

## 2016-04-11 DIAGNOSIS — D229 Melanocytic nevi, unspecified: Secondary | ICD-10-CM | POA: Diagnosis not present

## 2016-04-11 DIAGNOSIS — Z85828 Personal history of other malignant neoplasm of skin: Secondary | ICD-10-CM | POA: Diagnosis not present

## 2016-04-11 DIAGNOSIS — L821 Other seborrheic keratosis: Secondary | ICD-10-CM | POA: Diagnosis not present

## 2016-04-20 DIAGNOSIS — Z800 Family history of malignant neoplasm of digestive organs: Secondary | ICD-10-CM | POA: Diagnosis not present

## 2016-04-20 DIAGNOSIS — R197 Diarrhea, unspecified: Secondary | ICD-10-CM | POA: Diagnosis not present

## 2016-04-20 DIAGNOSIS — Z1211 Encounter for screening for malignant neoplasm of colon: Secondary | ICD-10-CM | POA: Diagnosis not present

## 2016-04-20 DIAGNOSIS — K219 Gastro-esophageal reflux disease without esophagitis: Secondary | ICD-10-CM | POA: Diagnosis not present

## 2016-04-24 DIAGNOSIS — M533 Sacrococcygeal disorders, not elsewhere classified: Secondary | ICD-10-CM | POA: Diagnosis not present

## 2016-04-26 DIAGNOSIS — F331 Major depressive disorder, recurrent, moderate: Secondary | ICD-10-CM | POA: Diagnosis not present

## 2016-05-03 DIAGNOSIS — F331 Major depressive disorder, recurrent, moderate: Secondary | ICD-10-CM | POA: Diagnosis not present

## 2016-05-03 DIAGNOSIS — H40003 Preglaucoma, unspecified, bilateral: Secondary | ICD-10-CM | POA: Diagnosis not present

## 2016-05-24 ENCOUNTER — Other Ambulatory Visit: Payer: Self-pay | Admitting: General Surgery

## 2016-05-24 DIAGNOSIS — N6320 Unspecified lump in the left breast, unspecified quadrant: Secondary | ICD-10-CM

## 2016-05-30 ENCOUNTER — Encounter: Payer: Self-pay | Admitting: *Deleted

## 2016-06-15 ENCOUNTER — Ambulatory Visit
Admission: RE | Admit: 2016-06-15 | Discharge: 2016-06-15 | Disposition: A | Discharge status: Home / Self Care | Payer: Medicare Other | Source: Ambulatory Visit | Attending: General Surgery | Admitting: General Surgery

## 2016-06-15 ENCOUNTER — Other Ambulatory Visit: Payer: Self-pay | Admitting: General Surgery

## 2016-06-15 DIAGNOSIS — N6320 Unspecified lump in the left breast, unspecified quadrant: Secondary | ICD-10-CM | POA: Insufficient documentation

## 2016-06-15 DIAGNOSIS — R928 Other abnormal and inconclusive findings on diagnostic imaging of breast: Secondary | ICD-10-CM | POA: Diagnosis not present

## 2016-06-15 DIAGNOSIS — S60041A Contusion of right ring finger without damage to nail, initial encounter: Secondary | ICD-10-CM | POA: Diagnosis not present

## 2016-06-18 ENCOUNTER — Encounter: Payer: Self-pay | Admitting: *Deleted

## 2016-06-18 DIAGNOSIS — S62654A Nondisplaced fracture of medial phalanx of right ring finger, initial encounter for closed fracture: Secondary | ICD-10-CM | POA: Diagnosis not present

## 2016-06-21 ENCOUNTER — Ambulatory Visit: Payer: Medicare Other | Admitting: General Surgery

## 2016-06-22 DIAGNOSIS — K2970 Gastritis, unspecified, without bleeding: Secondary | ICD-10-CM | POA: Diagnosis not present

## 2016-06-22 DIAGNOSIS — Z13810 Encounter for screening for upper gastrointestinal disorder: Secondary | ICD-10-CM | POA: Diagnosis not present

## 2016-06-22 DIAGNOSIS — Z800 Family history of malignant neoplasm of digestive organs: Secondary | ICD-10-CM | POA: Diagnosis not present

## 2016-06-22 DIAGNOSIS — K219 Gastro-esophageal reflux disease without esophagitis: Secondary | ICD-10-CM | POA: Diagnosis not present

## 2016-07-09 ENCOUNTER — Encounter: Payer: Self-pay | Admitting: *Deleted

## 2016-07-09 DIAGNOSIS — S62654D Nondisplaced fracture of medial phalanx of right ring finger, subsequent encounter for fracture with routine healing: Secondary | ICD-10-CM | POA: Diagnosis not present

## 2016-07-11 ENCOUNTER — Ambulatory Visit (INDEPENDENT_AMBULATORY_CARE_PROVIDER_SITE_OTHER): Payer: Medicare Other | Admitting: General Surgery

## 2016-07-11 ENCOUNTER — Encounter: Payer: Self-pay | Admitting: General Surgery

## 2016-07-11 VITALS — BP 130/78 | HR 78 | Resp 14 | Ht 62.5 in | Wt 168.0 lb

## 2016-07-11 DIAGNOSIS — Z87898 Personal history of other specified conditions: Secondary | ICD-10-CM | POA: Diagnosis not present

## 2016-07-11 DIAGNOSIS — Z800 Family history of malignant neoplasm of digestive organs: Secondary | ICD-10-CM | POA: Diagnosis not present

## 2016-07-11 DIAGNOSIS — N6002 Solitary cyst of left breast: Secondary | ICD-10-CM

## 2016-07-11 DIAGNOSIS — F331 Major depressive disorder, recurrent, moderate: Secondary | ICD-10-CM | POA: Diagnosis not present

## 2016-07-11 DIAGNOSIS — Z803 Family history of malignant neoplasm of breast: Secondary | ICD-10-CM | POA: Diagnosis not present
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
## 2016-07-11 NOTE — Patient Instructions (Signed)
The patient is aware to call back for any questions or concerns.  

## 2016-07-12 DIAGNOSIS — F331 Major depressive disorder, recurrent, moderate: Secondary | ICD-10-CM | POA: Diagnosis not present

## 2016-07-16 DIAGNOSIS — G4710 Hypersomnia, unspecified: Secondary | ICD-10-CM | POA: Diagnosis not present

## 2016-07-16 DIAGNOSIS — E039 Hypothyroidism, unspecified: Secondary | ICD-10-CM | POA: Diagnosis not present

## 2016-07-16 DIAGNOSIS — J309 Allergic rhinitis, unspecified: Secondary | ICD-10-CM | POA: Diagnosis not present

## 2016-07-16 DIAGNOSIS — Z23 Encounter for immunization: Secondary | ICD-10-CM | POA: Diagnosis not present

## 2016-07-16 DIAGNOSIS — R5382 Chronic fatigue, unspecified: Secondary | ICD-10-CM | POA: Diagnosis not present

## 2016-07-16 DIAGNOSIS — M797 Fibromyalgia: Secondary | ICD-10-CM | POA: Diagnosis not present

## 2016-07-19 ENCOUNTER — Encounter: Payer: Self-pay | Admitting: General Surgery

## 2016-07-19 ENCOUNTER — Ambulatory Visit (INDEPENDENT_AMBULATORY_CARE_PROVIDER_SITE_OTHER): Payer: Medicare Other | Admitting: General Surgery

## 2016-07-19 ENCOUNTER — Inpatient Hospital Stay: Payer: Self-pay

## 2016-07-19 VITALS — BP 130/72 | HR 72 | Resp 12 | Ht 60.0 in | Wt 168.0 lb

## 2016-07-19 DIAGNOSIS — N6320 Unspecified lump in the left breast, unspecified quadrant: Secondary | ICD-10-CM

## 2016-07-19 DIAGNOSIS — N6012 Diffuse cystic mastopathy of left breast: Secondary | ICD-10-CM | POA: Diagnosis not present

## 2016-07-19 HISTORY — PX: BREAST BIOPSY: SHX20
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
## 2016-07-19 NOTE — Patient Instructions (Signed)

## 2016-07-20 ENCOUNTER — Encounter: Payer: Self-pay | Admitting: General Surgery

## 2016-07-20 ENCOUNTER — Telehealth: Payer: Self-pay

## 2016-07-20 NOTE — Telephone Encounter (Signed)
Pathology results from biopsy done 07/19/16. Benign Fibrocystic changes. Benign pathology results. Per Dr Jamal Collin patient to follow up here in 5 months with a left breast mammogram and office visit. Message left for patient to call back for results.

## 2016-07-23 NOTE — Telephone Encounter (Signed)
Notified patient as instructed, patient pleased. Discussed follow-up appointments, patient agrees  

## 2016-07-25 DIAGNOSIS — F331 Major depressive disorder, recurrent, moderate: Secondary | ICD-10-CM | POA: Diagnosis not present

## 2016-08-17 ENCOUNTER — Ambulatory Visit: Payer: Medicare Other | Admitting: Neurology

## 2016-08-20 DIAGNOSIS — M7062 Trochanteric bursitis, left hip: Secondary | ICD-10-CM | POA: Diagnosis not present

## 2016-08-20 DIAGNOSIS — M7061 Trochanteric bursitis, right hip: Secondary | ICD-10-CM | POA: Diagnosis not present

## 2016-08-23 ENCOUNTER — Encounter: Payer: Self-pay | Admitting: Neurology

## 2016-09-11 DIAGNOSIS — M533 Sacrococcygeal disorders, not elsewhere classified: Secondary | ICD-10-CM | POA: Diagnosis not present

## 2016-09-14 DIAGNOSIS — F331 Major depressive disorder, recurrent, moderate: Secondary | ICD-10-CM | POA: Diagnosis not present

## 2016-10-08 DIAGNOSIS — E039 Hypothyroidism, unspecified: Secondary | ICD-10-CM | POA: Diagnosis not present

## 2016-10-08 DIAGNOSIS — S7012XA Contusion of left thigh, initial encounter: Secondary | ICD-10-CM | POA: Diagnosis not present

## 2016-10-08 DIAGNOSIS — E785 Hyperlipidemia, unspecified: Secondary | ICD-10-CM | POA: Diagnosis not present

## 2016-10-08 DIAGNOSIS — Z8673 Personal history of transient ischemic attack (TIA), and cerebral infarction without residual deficits: Secondary | ICD-10-CM | POA: Diagnosis not present

## 2016-10-08 DIAGNOSIS — J069 Acute upper respiratory infection, unspecified: Secondary | ICD-10-CM | POA: Diagnosis not present

## 2016-10-08 DIAGNOSIS — M797 Fibromyalgia: Secondary | ICD-10-CM | POA: Diagnosis not present

## 2016-10-17 DIAGNOSIS — F331 Major depressive disorder, recurrent, moderate: Secondary | ICD-10-CM | POA: Diagnosis not present

## 2016-10-19 ENCOUNTER — Other Ambulatory Visit: Payer: Self-pay

## 2016-10-19 DIAGNOSIS — N6320 Unspecified lump in the left breast, unspecified quadrant: Secondary | ICD-10-CM

## 2016-10-19 DIAGNOSIS — N6002 Solitary cyst of left breast: Secondary | ICD-10-CM

## 2016-11-07 ENCOUNTER — Ambulatory Visit: Payer: Medicare Other | Admitting: Neurology

## 2016-11-14 ENCOUNTER — Ambulatory Visit: Payer: Medicare Other | Admitting: Neurology

## 2016-11-23 ENCOUNTER — Other Ambulatory Visit: Payer: Self-pay | Admitting: Neurology

## 2016-11-23 DIAGNOSIS — G43009 Migraine without aura, not intractable, without status migrainosus: Secondary | ICD-10-CM

## 2016-11-27 ENCOUNTER — Emergency Department
Admission: EM | Admit: 2016-11-27 | Discharge: 2016-11-27 | Disposition: A | Discharge status: Home / Self Care | Payer: Medicare Other | Attending: Student in an Organized Health Care Education/Training Program | Admitting: Student in an Organized Health Care Education/Training Program

## 2016-11-27 ENCOUNTER — Emergency Department: Payer: Medicare Other

## 2016-11-27 DIAGNOSIS — Z87891 Personal history of nicotine dependence: Secondary | ICD-10-CM | POA: Insufficient documentation

## 2016-11-27 DIAGNOSIS — Y929 Unspecified place or not applicable: Secondary | ICD-10-CM | POA: Diagnosis not present

## 2016-11-27 DIAGNOSIS — Y999 Unspecified external cause status: Secondary | ICD-10-CM | POA: Diagnosis not present

## 2016-11-27 DIAGNOSIS — W230XXA Caught, crushed, jammed, or pinched between moving objects, initial encounter: Secondary | ICD-10-CM | POA: Diagnosis not present

## 2016-11-27 DIAGNOSIS — S61210A Laceration without foreign body of right index finger without damage to nail, initial encounter: Secondary | ICD-10-CM | POA: Diagnosis not present

## 2016-11-27 DIAGNOSIS — Z85828 Personal history of other malignant neoplasm of skin: Secondary | ICD-10-CM | POA: Diagnosis not present

## 2016-11-27 DIAGNOSIS — J45909 Unspecified asthma, uncomplicated: Secondary | ICD-10-CM | POA: Insufficient documentation

## 2016-11-27 DIAGNOSIS — E039 Hypothyroidism, unspecified: Secondary | ICD-10-CM | POA: Insufficient documentation

## 2016-11-27 DIAGNOSIS — S6991XA Unspecified injury of right wrist, hand and finger(s), initial encounter: Secondary | ICD-10-CM | POA: Diagnosis present

## 2016-11-27 DIAGNOSIS — Y939 Activity, unspecified: Secondary | ICD-10-CM | POA: Diagnosis not present

## 2016-11-27 DIAGNOSIS — Z23 Encounter for immunization: Secondary | ICD-10-CM | POA: Diagnosis not present

## 2016-11-27 DIAGNOSIS — M79644 Pain in right finger(s): Secondary | ICD-10-CM | POA: Diagnosis not present

## 2016-11-27 MED ORDER — LIDOCAINE HCL (PF) 1 % IJ SOLN
5.0000 mL | Freq: Once | INTRAMUSCULAR | Status: AC
  Administered 2016-11-27: 5 mL via INTRADERMAL
  Filled 2016-11-27: qty 5

## 2016-11-27 MED ORDER — LIDOCAINE HCL (PF) 1 % IJ SOLN
INTRAMUSCULAR | Status: AC
  Administered 2016-11-27: 5 mL via INTRADERMAL
  Filled 2016-11-27: qty 5

## 2016-11-27 MED ORDER — TETANUS-DIPHTH-ACELL PERTUSSIS 5-2.5-18.5 LF-MCG/0.5 IM SUSP
0.5000 mL | Freq: Once | INTRAMUSCULAR | Status: AC
  Administered 2016-11-27: 0.5 mL via INTRAMUSCULAR
  Filled 2016-11-27: qty 0.5

## 2016-11-27 MED ORDER — CLINDAMYCIN HCL 300 MG PO CAPS: 300.0000 mg | ORAL_CAPSULE | Freq: Three times a day (TID) | ORAL | 0 refills | Status: AC

## 2016-11-27 NOTE — ED Triage Notes (Signed)
Pt crushed right index finger using a lift, swelling and bruising noted with 1 inch lac.
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
## 2016-12-12 DIAGNOSIS — M65311 Trigger thumb, right thumb: Secondary | ICD-10-CM | POA: Diagnosis not present

## 2016-12-12 DIAGNOSIS — S61210A Laceration without foreign body of right index finger without damage to nail, initial encounter: Secondary | ICD-10-CM | POA: Diagnosis not present

## 2016-12-14 ENCOUNTER — Ambulatory Visit
Admission: RE | Admit: 2016-12-14 | Discharge: 2016-12-14 | Disposition: A | Discharge status: Home / Self Care | Payer: Medicare Other | Source: Ambulatory Visit | Attending: General Surgery | Admitting: General Surgery

## 2016-12-14 DIAGNOSIS — N6002 Solitary cyst of left breast: Secondary | ICD-10-CM

## 2016-12-14 DIAGNOSIS — N6320 Unspecified lump in the left breast, unspecified quadrant: Secondary | ICD-10-CM | POA: Insufficient documentation

## 2016-12-14 DIAGNOSIS — R928 Other abnormal and inconclusive findings on diagnostic imaging of breast: Secondary | ICD-10-CM | POA: Diagnosis not present

## 2016-12-14 DIAGNOSIS — N6489 Other specified disorders of breast: Secondary | ICD-10-CM | POA: Diagnosis not present

## 2016-12-25 ENCOUNTER — Ambulatory Visit (INDEPENDENT_AMBULATORY_CARE_PROVIDER_SITE_OTHER): Payer: Medicare Other | Admitting: General Surgery

## 2016-12-25 ENCOUNTER — Encounter: Payer: Self-pay | Admitting: General Surgery

## 2016-12-25 VITALS — BP 124/66 | HR 70 | Resp 12 | Ht 62.5 in | Wt 172.0 lb

## 2016-12-25 DIAGNOSIS — Z87898 Personal history of other specified conditions: Secondary | ICD-10-CM

## 2016-12-25 DIAGNOSIS — H5711 Ocular pain, right eye: Secondary | ICD-10-CM | POA: Diagnosis not present

## 2016-12-25 DIAGNOSIS — Z803 Family history of malignant neoplasm of breast: Secondary | ICD-10-CM | POA: Diagnosis not present

## 2016-12-25 DIAGNOSIS — B309 Viral conjunctivitis, unspecified: Secondary | ICD-10-CM | POA: Diagnosis not present
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
## 2016-12-25 NOTE — Patient Instructions (Addendum)
Patient to have a bilateral screening mammogram follow up in 6 months.

## 2016-12-27 DIAGNOSIS — B309 Viral conjunctivitis, unspecified: Secondary | ICD-10-CM | POA: Diagnosis not present

## 2017-01-10 DIAGNOSIS — M65311 Trigger thumb, right thumb: Secondary | ICD-10-CM | POA: Diagnosis not present

## 2017-01-18 DIAGNOSIS — F331 Major depressive disorder, recurrent, moderate: Secondary | ICD-10-CM | POA: Diagnosis not present

## 2017-01-24 DIAGNOSIS — F331 Major depressive disorder, recurrent, moderate: Secondary | ICD-10-CM | POA: Diagnosis not present

## 2017-01-31 ENCOUNTER — Encounter: Payer: Self-pay | Admitting: Neurology

## 2017-01-31 ENCOUNTER — Ambulatory Visit (INDEPENDENT_AMBULATORY_CARE_PROVIDER_SITE_OTHER): Payer: Medicare Other | Admitting: Neurology

## 2017-01-31 VITALS — BP 130/66 | HR 76 | Ht 62.5 in | Wt 169.0 lb

## 2017-01-31 DIAGNOSIS — G250 Essential tremor: Secondary | ICD-10-CM | POA: Diagnosis not present

## 2017-01-31 DIAGNOSIS — G43009 Migraine without aura, not intractable, without status migrainosus: Secondary | ICD-10-CM | POA: Diagnosis not present

## 2017-01-31 MED ORDER — DULOXETINE HCL 60 MG PO CPEP: 60.0000 mg | ORAL_CAPSULE | Freq: Every day | ORAL | 3 refills | Status: DC

## 2017-01-31 NOTE — Patient Instructions (Signed)
1.  I refilled your Cymbalta. 2.  Follow up in one year
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
## 2017-02-11 DIAGNOSIS — I10 Essential (primary) hypertension: Secondary | ICD-10-CM | POA: Diagnosis not present

## 2017-02-11 DIAGNOSIS — Z1159 Encounter for screening for other viral diseases: Secondary | ICD-10-CM | POA: Diagnosis not present

## 2017-02-11 DIAGNOSIS — G4700 Insomnia, unspecified: Secondary | ICD-10-CM | POA: Diagnosis not present

## 2017-02-11 DIAGNOSIS — E034 Atrophy of thyroid (acquired): Secondary | ICD-10-CM | POA: Diagnosis not present

## 2017-02-11 DIAGNOSIS — K7689 Other specified diseases of liver: Secondary | ICD-10-CM | POA: Diagnosis not present

## 2017-02-11 DIAGNOSIS — Z13818 Encounter for screening for other digestive system disorders: Secondary | ICD-10-CM | POA: Diagnosis not present

## 2017-02-11 DIAGNOSIS — E559 Vitamin D deficiency, unspecified: Secondary | ICD-10-CM | POA: Diagnosis not present

## 2017-02-11 DIAGNOSIS — K219 Gastro-esophageal reflux disease without esophagitis: Secondary | ICD-10-CM | POA: Diagnosis not present

## 2017-02-11 DIAGNOSIS — R5383 Other fatigue: Secondary | ICD-10-CM | POA: Diagnosis not present

## 2017-02-11 DIAGNOSIS — R739 Hyperglycemia, unspecified: Secondary | ICD-10-CM | POA: Diagnosis not present

## 2017-02-11 DIAGNOSIS — M797 Fibromyalgia: Secondary | ICD-10-CM | POA: Diagnosis not present

## 2017-02-11 DIAGNOSIS — E039 Hypothyroidism, unspecified: Secondary | ICD-10-CM | POA: Diagnosis not present

## 2017-03-11 DIAGNOSIS — Z7189 Other specified counseling: Secondary | ICD-10-CM | POA: Diagnosis not present

## 2017-03-11 DIAGNOSIS — Z1389 Encounter for screening for other disorder: Secondary | ICD-10-CM | POA: Diagnosis not present

## 2017-03-11 DIAGNOSIS — R5383 Other fatigue: Secondary | ICD-10-CM | POA: Diagnosis not present

## 2017-03-11 DIAGNOSIS — K219 Gastro-esophageal reflux disease without esophagitis: Secondary | ICD-10-CM | POA: Diagnosis not present

## 2017-03-11 DIAGNOSIS — Z0001 Encounter for general adult medical examination with abnormal findings: Secondary | ICD-10-CM | POA: Diagnosis not present

## 2017-03-11 DIAGNOSIS — J069 Acute upper respiratory infection, unspecified: Secondary | ICD-10-CM | POA: Diagnosis not present

## 2017-03-13 ENCOUNTER — Telehealth: Payer: Self-pay | Admitting: *Deleted

## 2017-03-13 NOTE — Telephone Encounter (Signed)
Patient called concerned about an abdominal ultrasound that she had done at her primary physician office, [facility]. She stated the ultrasound was done for routine and there was a liver cyst that was found during the ultrasound. She would like to speak with you about the cyst and wants to know if you will review the information? She will get her them to fax a copy over. Her brother was diagnosed with liver cancer.

## 2017-04-08 DIAGNOSIS — B372 Candidiasis of skin and nail: Secondary | ICD-10-CM | POA: Diagnosis not present

## 2017-04-08 DIAGNOSIS — Z0000 Encounter for general adult medical examination without abnormal findings: Secondary | ICD-10-CM | POA: Diagnosis not present

## 2017-04-08 DIAGNOSIS — Z1151 Encounter for screening for human papillomavirus (HPV): Secondary | ICD-10-CM | POA: Diagnosis not present

## 2017-04-08 DIAGNOSIS — Z01419 Encounter for gynecological examination (general) (routine) without abnormal findings: Secondary | ICD-10-CM | POA: Diagnosis not present

## 2017-04-08 DIAGNOSIS — Z1231 Encounter for screening mammogram for malignant neoplasm of breast: Secondary | ICD-10-CM | POA: Diagnosis not present

## 2017-04-08 DIAGNOSIS — N939 Abnormal uterine and vaginal bleeding, unspecified: Secondary | ICD-10-CM | POA: Diagnosis not present

## 2017-04-26 DIAGNOSIS — M65311 Trigger thumb, right thumb: Secondary | ICD-10-CM | POA: Diagnosis not present

## 2017-05-01 DIAGNOSIS — M79605 Pain in left leg: Secondary | ICD-10-CM | POA: Diagnosis not present

## 2017-05-01 DIAGNOSIS — M79662 Pain in left lower leg: Secondary | ICD-10-CM | POA: Diagnosis not present

## 2017-05-01 DIAGNOSIS — G603 Idiopathic progressive neuropathy: Secondary | ICD-10-CM | POA: Diagnosis not present

## 2017-05-01 DIAGNOSIS — G9009 Other idiopathic peripheral autonomic neuropathy: Secondary | ICD-10-CM | POA: Diagnosis not present

## 2017-05-02 DIAGNOSIS — N390 Urinary tract infection, site not specified: Secondary | ICD-10-CM | POA: Diagnosis not present

## 2017-05-03 ENCOUNTER — Other Ambulatory Visit: Payer: Self-pay

## 2017-05-03 DIAGNOSIS — D1801 Hemangioma of skin and subcutaneous tissue: Secondary | ICD-10-CM | POA: Diagnosis not present

## 2017-05-03 DIAGNOSIS — L821 Other seborrheic keratosis: Secondary | ICD-10-CM | POA: Diagnosis not present

## 2017-05-03 DIAGNOSIS — Z1231 Encounter for screening mammogram for malignant neoplasm of breast: Secondary | ICD-10-CM

## 2017-05-03 DIAGNOSIS — Z85828 Personal history of other malignant neoplasm of skin: Secondary | ICD-10-CM | POA: Diagnosis not present

## 2017-05-22 DIAGNOSIS — M65311 Trigger thumb, right thumb: Secondary | ICD-10-CM | POA: Diagnosis not present

## 2017-06-12 DIAGNOSIS — Z23 Encounter for immunization: Secondary | ICD-10-CM | POA: Diagnosis not present

## 2017-06-30 DIAGNOSIS — N390 Urinary tract infection, site not specified: Secondary | ICD-10-CM | POA: Diagnosis not present

## 2017-06-30 DIAGNOSIS — R300 Dysuria: Secondary | ICD-10-CM | POA: Diagnosis not present

## 2017-07-04 ENCOUNTER — Ambulatory Visit: Payer: Medicare Other | Admitting: General Surgery

## 2017-07-05 ENCOUNTER — Ambulatory Visit
Admission: RE | Admit: 2017-07-05 | Discharge: 2017-07-05 | Disposition: A | Discharge status: Home / Self Care | Payer: Medicare Other | Source: Ambulatory Visit | Attending: General Surgery | Admitting: General Surgery

## 2017-07-05 DIAGNOSIS — Z1231 Encounter for screening mammogram for malignant neoplasm of breast: Secondary | ICD-10-CM | POA: Diagnosis not present

## 2017-07-08 DIAGNOSIS — F331 Major depressive disorder, recurrent, moderate: Secondary | ICD-10-CM | POA: Diagnosis not present

## 2017-07-09 DIAGNOSIS — N200 Calculus of kidney: Secondary | ICD-10-CM | POA: Diagnosis not present

## 2017-07-09 DIAGNOSIS — R300 Dysuria: Secondary | ICD-10-CM | POA: Diagnosis not present

## 2017-07-09 DIAGNOSIS — R10824 Left lower quadrant rebound abdominal tenderness: Secondary | ICD-10-CM | POA: Diagnosis not present

## 2017-07-09 DIAGNOSIS — R5383 Other fatigue: Secondary | ICD-10-CM | POA: Diagnosis not present

## 2017-07-09 DIAGNOSIS — R319 Hematuria, unspecified: Secondary | ICD-10-CM | POA: Diagnosis not present

## 2017-07-09 DIAGNOSIS — R10823 Right lower quadrant rebound abdominal tenderness: Secondary | ICD-10-CM | POA: Diagnosis not present

## 2017-07-10 ENCOUNTER — Ambulatory Visit (INDEPENDENT_AMBULATORY_CARE_PROVIDER_SITE_OTHER): Payer: Medicare Other | Admitting: General Surgery

## 2017-07-10 ENCOUNTER — Encounter: Payer: Self-pay | Admitting: General Surgery

## 2017-07-10 VITALS — BP 144/70 | HR 80 | Resp 14 | Ht 62.0 in | Wt 163.0 lb

## 2017-07-10 DIAGNOSIS — Z800 Family history of malignant neoplasm of digestive organs: Secondary | ICD-10-CM

## 2017-07-10 DIAGNOSIS — Z803 Family history of malignant neoplasm of breast: Secondary | ICD-10-CM

## 2017-07-10 DIAGNOSIS — Z87898 Personal history of other specified conditions: Secondary | ICD-10-CM

## 2017-07-10 NOTE — Patient Instructions (Addendum)
The patient is aware to call back for any questions or concerns.  Patient will be asked to return to in one year with a bilateral screening mammogram with Dr Bary Castilla.  Discussed that she may need follow up with Urology regarding kidney stones
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
## 2017-12-19 DIAGNOSIS — R5383 Other fatigue: Secondary | ICD-10-CM | POA: Diagnosis not present

## 2017-12-19 DIAGNOSIS — R030 Elevated blood-pressure reading, without diagnosis of hypertension: Secondary | ICD-10-CM | POA: Diagnosis not present

## 2017-12-19 DIAGNOSIS — E559 Vitamin D deficiency, unspecified: Secondary | ICD-10-CM | POA: Diagnosis not present

## 2017-12-19 DIAGNOSIS — Z0000 Encounter for general adult medical examination without abnormal findings: Secondary | ICD-10-CM | POA: Diagnosis not present

## 2017-12-19 DIAGNOSIS — R5382 Chronic fatigue, unspecified: Secondary | ICD-10-CM | POA: Diagnosis not present

## 2017-12-19 DIAGNOSIS — E042 Nontoxic multinodular goiter: Secondary | ICD-10-CM | POA: Diagnosis not present

## 2017-12-19 DIAGNOSIS — E7800 Pure hypercholesterolemia, unspecified: Secondary | ICD-10-CM | POA: Diagnosis not present

## 2017-12-19 DIAGNOSIS — E039 Hypothyroidism, unspecified: Secondary | ICD-10-CM | POA: Diagnosis not present

## 2017-12-19 DIAGNOSIS — M797 Fibromyalgia: Secondary | ICD-10-CM | POA: Diagnosis not present

## 2017-12-19 DIAGNOSIS — K219 Gastro-esophageal reflux disease without esophagitis: Secondary | ICD-10-CM | POA: Diagnosis not present

## 2017-12-19 DIAGNOSIS — R7989 Other specified abnormal findings of blood chemistry: Secondary | ICD-10-CM | POA: Diagnosis not present

## 2017-12-30 DIAGNOSIS — R931 Abnormal findings on diagnostic imaging of heart and coronary circulation: Secondary | ICD-10-CM | POA: Diagnosis not present

## 2017-12-30 DIAGNOSIS — I6523 Occlusion and stenosis of bilateral carotid arteries: Secondary | ICD-10-CM | POA: Diagnosis not present

## 2017-12-30 DIAGNOSIS — I348 Other nonrheumatic mitral valve disorders: Secondary | ICD-10-CM | POA: Diagnosis not present

## 2017-12-30 DIAGNOSIS — B029 Zoster without complications: Secondary | ICD-10-CM | POA: Diagnosis not present

## 2017-12-30 DIAGNOSIS — I517 Cardiomegaly: Secondary | ICD-10-CM | POA: Diagnosis not present

## 2017-12-30 DIAGNOSIS — I358 Other nonrheumatic aortic valve disorders: Secondary | ICD-10-CM | POA: Diagnosis not present

## 2018-01-07 IMAGING — US US BREAST*L* LIMITED INC AXILLA
1 series · 8 of 8 positions shown · non-contrast
Comparison: Previous exam(s).

CLINICAL DATA: 75-year-old female presenting for follow-up for
probably benign masses in the left breast. She had a mass biopsied
o'clock, 4 cm from the nipple with pathology indicating Pash and
fibrocystic changes. A cyst in the lateral left breast at [DATE] was
aspirated.

EXAM:
2D DIGITAL DIAGNOSTIC UNILATERAL LEFT MAMMOGRAM WITH CAD AND ADJUNCT
TOMO
LEFT BREAST ULTRASOUND

[Series 1: us breast*left* limited inc axilla · 0.06mm/px · 8 of 8 slices shown]
[im 1/8]
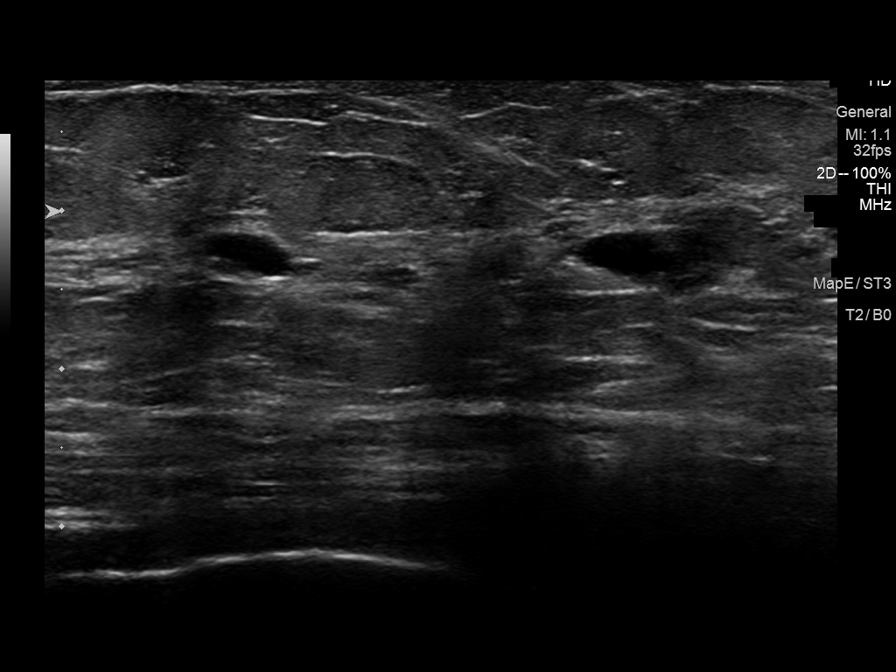
[im 2/8]
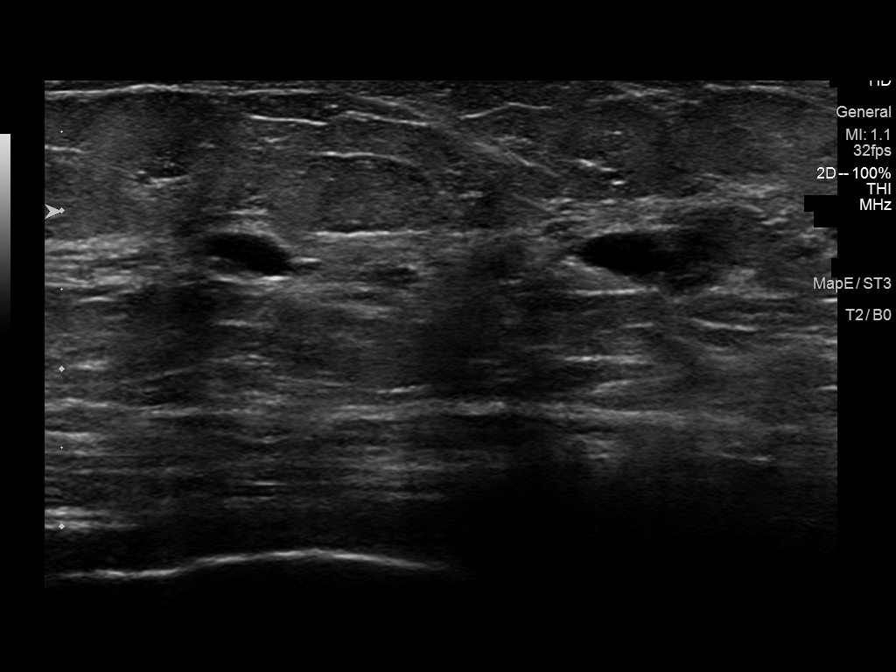
[im 3/8]
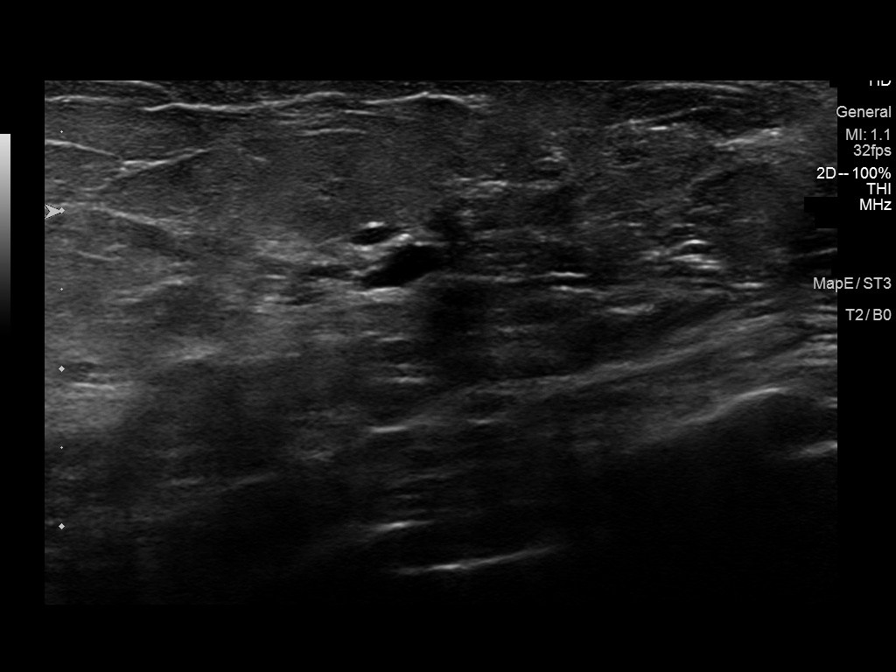
[im 4/8]
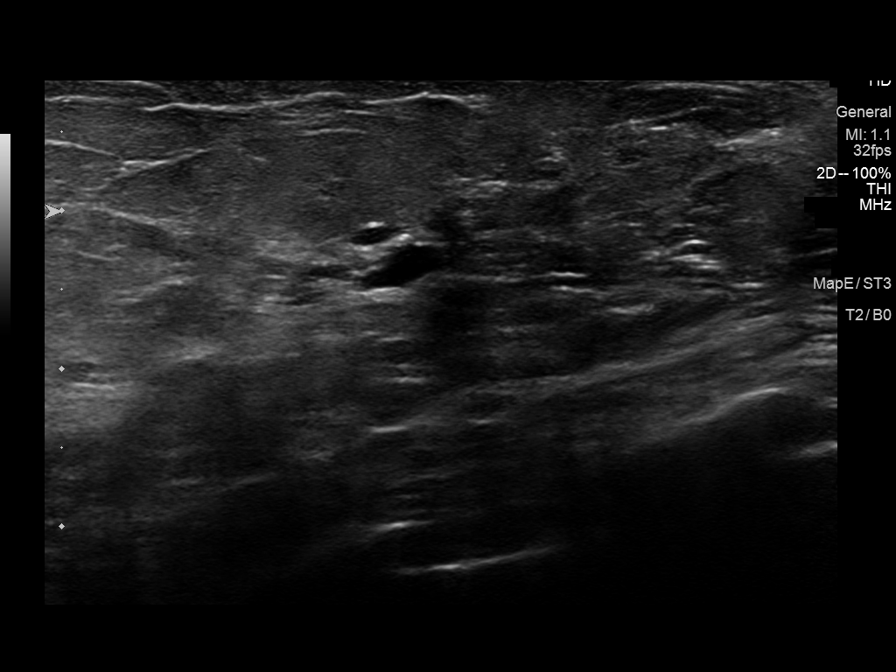
[im 5/8]
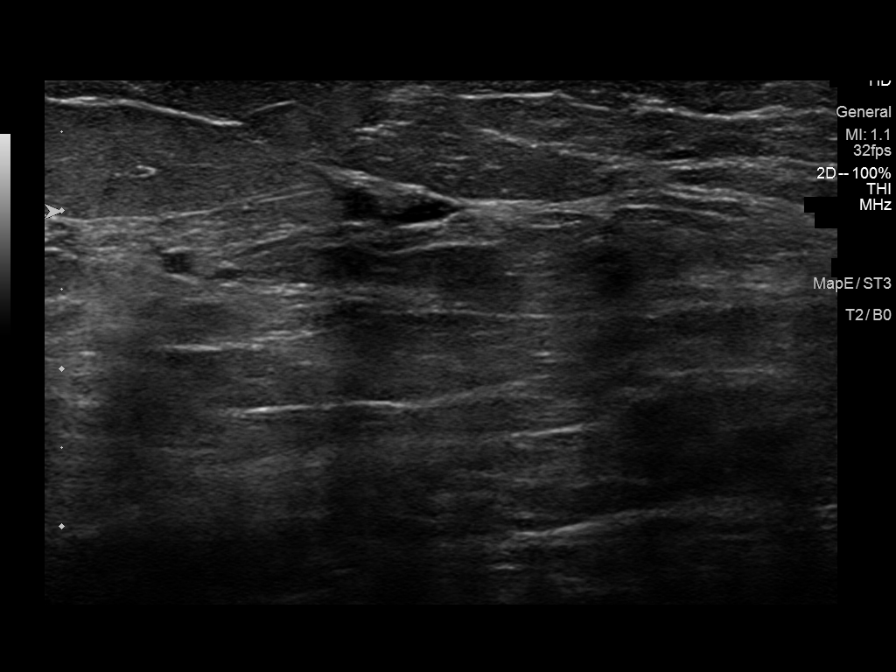
[im 6/8]
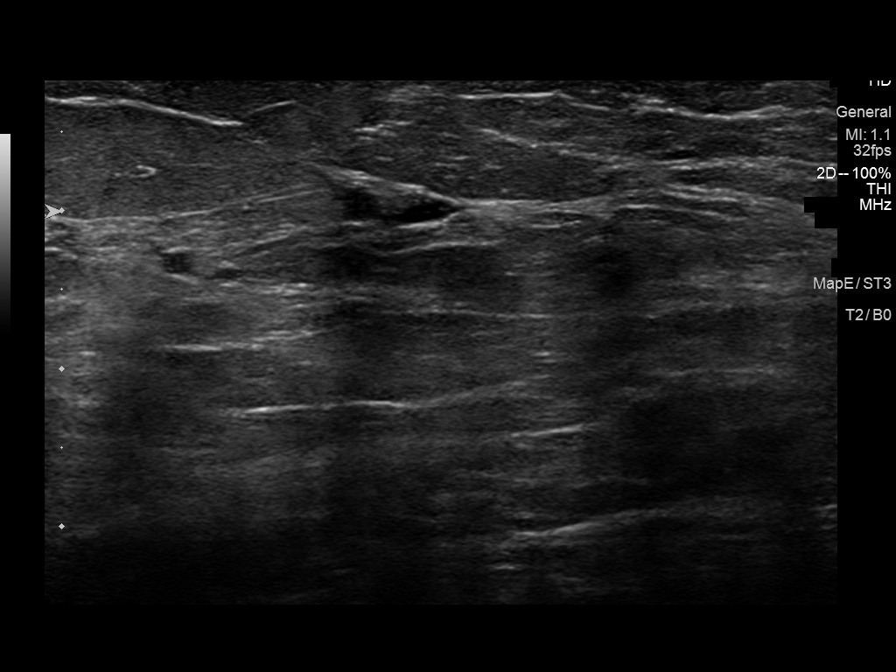
[im 7/8]
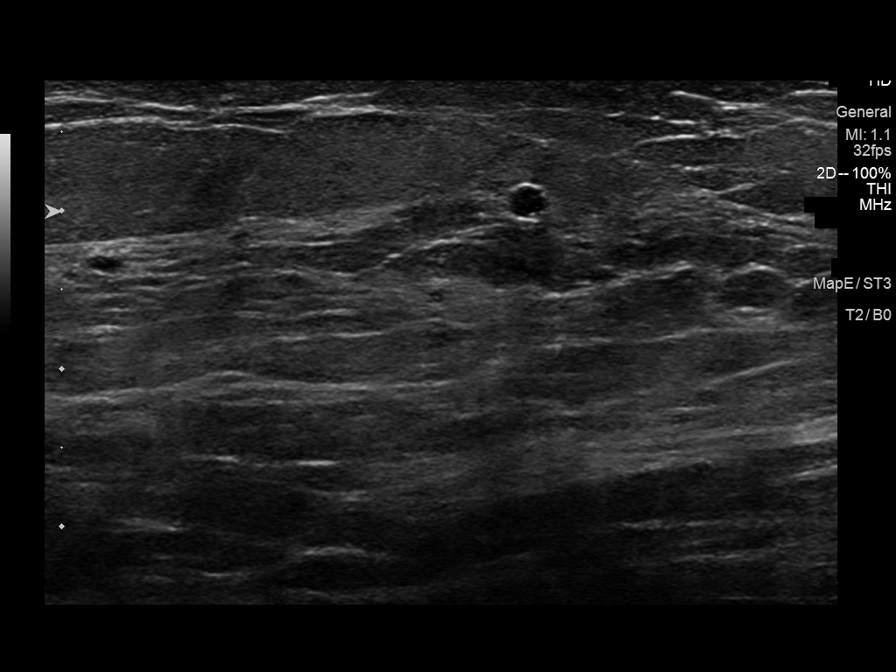
[im 8/8]
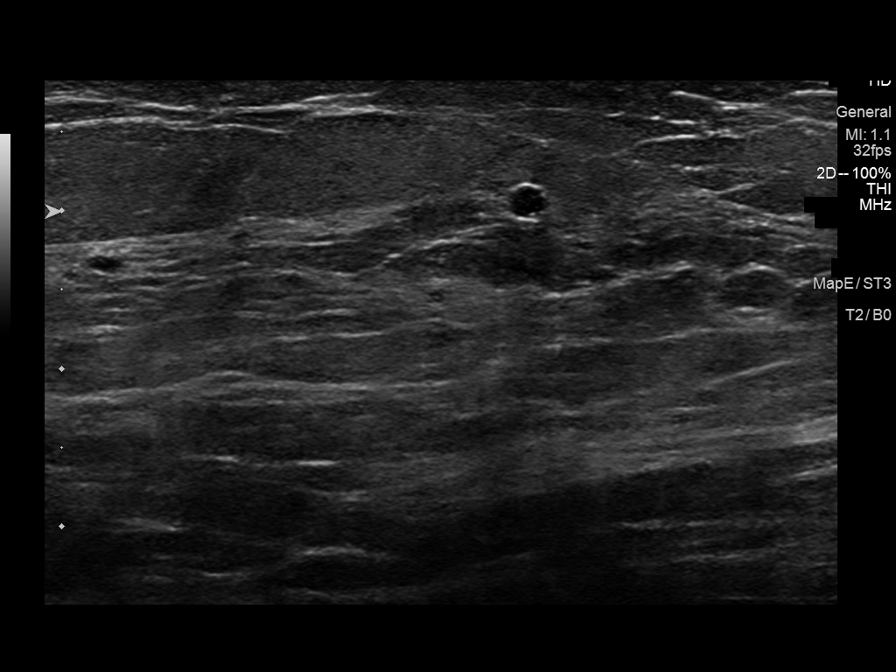

[8 of 8 positions shown; findings below may reference images not displayed]

ACR Breast Density Category b: There are scattered areas of
fibroglandular density.
FINDINGS: A biopsy marking clip is seen in the lower slightly outer quadrant
of the left breast from the interval benign biopsy. No new
suspicious calcifications, masses or areas of distortion are seen in
the bilateral breasts.

Mammographic images were processed with CAD.

Ultrasound targeted to the lower outer quadrant of the left breast
demonstrates multiple benign-appearing cysts. Representative images
were acquired. No suspicious masses or areas of shadowing are
identified.
IMPRESSION: 1. Numerous benign-appearing cysts are identified in the lower outer
quadrant of the left breast.

2. No mammographic or targeted sonographic evidence of left breast
malignancy.

RECOMMENDATION:
Return to routine screening mammography is recommended. The patient
will be due for screening in Saturday June, 2017.

I have discussed the findings and recommendations with the patient.
Results were also provided in writing at the conclusion of the
visit. If applicable, a reminder letter will be sent to the patient
regarding the next appointment.

BI-RADS CATEGORY  2: Benign.

## 2018-01-09 DIAGNOSIS — F331 Major depressive disorder, recurrent, moderate: Secondary | ICD-10-CM | POA: Diagnosis not present

## 2018-01-23 DIAGNOSIS — G603 Idiopathic progressive neuropathy: Secondary | ICD-10-CM | POA: Diagnosis not present

## 2018-01-23 DIAGNOSIS — G608 Other hereditary and idiopathic neuropathies: Secondary | ICD-10-CM | POA: Diagnosis not present

## 2018-01-23 DIAGNOSIS — M79661 Pain in right lower leg: Secondary | ICD-10-CM | POA: Diagnosis not present

## 2018-01-31 ENCOUNTER — Encounter: Payer: Self-pay | Admitting: Neurology

## 2018-01-31 ENCOUNTER — Other Ambulatory Visit: Payer: Medicare Other

## 2018-01-31 ENCOUNTER — Ambulatory Visit (INDEPENDENT_AMBULATORY_CARE_PROVIDER_SITE_OTHER): Payer: Medicare Other | Admitting: Neurology

## 2018-01-31 VITALS — BP 130/68 | HR 78 | Ht 62.0 in | Wt 163.0 lb

## 2018-01-31 DIAGNOSIS — Z1321 Encounter for screening for nutritional disorder: Secondary | ICD-10-CM

## 2018-01-31 DIAGNOSIS — R413 Other amnesia: Secondary | ICD-10-CM

## 2018-01-31 DIAGNOSIS — R200 Anesthesia of skin: Secondary | ICD-10-CM

## 2018-01-31 DIAGNOSIS — R202 Paresthesia of skin: Secondary | ICD-10-CM

## 2018-01-31 DIAGNOSIS — G43109 Migraine with aura, not intractable, without status migrainosus: Secondary | ICD-10-CM

## 2018-01-31 DIAGNOSIS — G43009 Migraine without aura, not intractable, without status migrainosus: Secondary | ICD-10-CM

## 2018-01-31 MED ORDER — DULOXETINE HCL 60 MG PO CPEP: 60.0000 mg | ORAL_CAPSULE | Freq: Every day | ORAL | 3 refills | Status: DC

## 2018-01-31 NOTE — Patient Instructions (Addendum)
1.  We will order nerve study in both arms 2.  For memory, we will check B12, TSH, free T3, free T4.Your provider has requested that you have labwork completed today. Please go to [facility] ([id number]) on the second floor of this building before leaving the office today. You do not need to check in. If you are not called within 15 minutes please check with the front desk.  3.  Follow up in one year
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
## 2018-02-13 ENCOUNTER — Other Ambulatory Visit: Payer: Self-pay

## 2018-02-13 ENCOUNTER — Ambulatory Visit: Payer: Medicare Other | Admitting: Neurology

## 2018-02-13 ENCOUNTER — Other Ambulatory Visit: Payer: Medicare Other

## 2018-02-13 DIAGNOSIS — R202 Paresthesia of skin: Secondary | ICD-10-CM | POA: Diagnosis not present

## 2018-02-13 DIAGNOSIS — Z1321 Encounter for screening for nutritional disorder: Secondary | ICD-10-CM

## 2018-02-13 DIAGNOSIS — R413 Other amnesia: Secondary | ICD-10-CM

## 2018-02-13 DIAGNOSIS — R209 Unspecified disturbances of skin sensation: Secondary | ICD-10-CM | POA: Diagnosis not present

## 2018-02-13 LAB — VITAMIN B12: Vitamin B-12: 538 pg/mL (ref 200–1100)

## 2018-02-14 ENCOUNTER — Telehealth: Payer: Self-pay | Admitting: Neurology

## 2018-02-14 LAB — TSH+T4F+T3FREE
Free T4: 0.95 ng/dL (ref 0.82–1.77)
T3, Free: 2.4 pg/mL (ref 2.0–4.4)
TSH: 1.15 u[IU]/mL (ref 0.450–4.500)

## 2018-02-14 NOTE — Telephone Encounter (Signed)
-----   Message from Pieter Partridge, DO sent at 02/14/2018 12:14 PM EDT ----- Labs are normal

## 2018-02-14 NOTE — Telephone Encounter (Signed)
Per DPR LMOM letting patient know labs okay and to call with any questions.

## 2018-02-25 ENCOUNTER — Ambulatory Visit (INDEPENDENT_AMBULATORY_CARE_PROVIDER_SITE_OTHER): Payer: Medicare Other | Admitting: Neurology

## 2018-02-25 DIAGNOSIS — G5603 Carpal tunnel syndrome, bilateral upper limbs: Secondary | ICD-10-CM

## 2018-02-25 DIAGNOSIS — R200 Anesthesia of skin: Secondary | ICD-10-CM

## 2018-02-25 DIAGNOSIS — R202 Paresthesia of skin: Secondary | ICD-10-CM

## 2018-02-25 NOTE — Procedures (Signed)
[facility]  [address]  [place], [place] [address] Tel: [phone] Fax:  [phone] Test Date:  02/25/2018  Patient: Nichole [name] DOB: September 25, 1940 Physician: Narda Amber, DO  Sex: Female Height: 5\' 2"  Ref Phys: Metta Clines, DO  ID#: [id number] Temp: 32.6C Technician:    Patient Complaints: This is a 77 year old female referred for evaluation of bilateral hand numbness.  NCV & EMG Findings: Extensive electrodiagnostic testing of the right upper extremity and additional studies of the left shows:  1. Bilateral mixed palmer sensory responses show prolonged latency. Bilateral median and ulnar sensory responses are within normal limits. 2. Bilateral median and ulnar motor responses are within normal limits. 3. There is no evidence of active or chronic motor axon loss changes affecting any of the tested muscles. Motor unit configuration and recruitment pattern is within normal limits.  Impression: Bilateral median neuropathy at or distal to the wrist, consistent with clinical diagnosis of carpal tunnel syndrome. Overall, these findings are very mild in degree electrically.   ___________________________ Narda Amber, DO    Nerve Conduction Studies Anti Sensory Summary Table   Site NR Peak (ms) Norm Peak (ms) P-T Amp (V) Norm P-T Amp  Left Median Anti Sensory (2nd Digit)  32.6C  Wrist    3.5 <3.8 39.3 >10  Right Median Anti Sensory (2nd Digit)  Wrist    3.2 <3.8 40.3 >10  Left Ulnar Anti Sensory (5th Digit)  32.6C  Wrist    2.5 <3.2 35.9 >5  Right Ulnar Anti Sensory (5th Digit)  32.6C  Wrist    2.3 <3.2 39.5 >5   Motor Summary Table   Site NR Onset (ms) Norm Onset (ms) O-P Amp (mV) Norm O-P Amp Site1 Site2 Delta-0 (ms) Dist (cm) Vel (m/s) Norm Vel (m/s)  Left Median Motor (Abd Poll Brev)  32.6C  Wrist    3.8 <4.0 9.6 >5 Elbow Wrist 5.0 26.0 52 >50  Elbow    8.8  9.0         Right Median Motor (Abd Poll Brev)  32.6C  Wrist    3.8 <4.0 7.7  >5 Elbow Wrist 5.0 26.0 52 >50  Elbow    8.8  7.7         Left Ulnar Motor (Abd Dig Minimi)  32.6C  Wrist    2.4 <3.1 9.0 >7 B Elbow Wrist 3.4 20.5 60 >50  B Elbow    5.8  8.5  A Elbow B Elbow 1.6 10.0 62 >50  A Elbow    7.4  8.3         Right Ulnar Motor (Abd Dig Minimi)  32.6C  Wrist    2.3 <3.1 9.6 >7 B Elbow Wrist 3.6 20.5 57 >50  B Elbow    5.9  8.7  A Elbow B Elbow 1.9 10.0 53 >50  A Elbow    7.8  8.3          Comparison Summary Table   Site NR Peak (ms) Norm Peak (ms) P-T Amp (V) Site1 Site2 Delta-P (ms) Norm Delta (ms)  Left Median/Ulnar Palm Comparison (Wrist - 8cm)  32.6C  Median Palm    2.1 <2.2 64.9 Median Palm Ulnar Palm 0.6   Ulnar Palm    1.5 <2.2 29.3      Right Median/Ulnar Palm Comparison (Wrist - 8cm)  32.6C  Median Palm    2.4 <2.2 55.8 Median Palm Ulnar Palm 1.1   Ulnar Palm    1.3 <2.2  21.2       EMG   Side Muscle Ins Act Fibs Psw Fasc Number Recrt Dur Dur. Amp Amp. Poly Poly. Comment  Right 1stDorInt Nml Nml Nml Nml Nml Nml Nml Nml Nml Nml Nml Nml N/A  Right Abd Poll Brev Nml Nml Nml Nml Nml Nml Nml Nml Nml Nml Nml Nml N/A  Right PronatorTeres Nml Nml Nml Nml Nml Nml Nml Nml Nml Nml Nml Nml N/A  Right Biceps Nml Nml Nml Nml Nml Nml Nml Nml Nml Nml Nml Nml N/A  Right Triceps Nml Nml Nml Nml Nml Nml Nml Nml Nml Nml Nml Nml N/A  Right Deltoid Nml Nml Nml Nml Nml Nml Nml Nml Nml Nml Nml Nml N/A  Left 1stDorInt Nml Nml Nml Nml Nml Nml Nml Nml Nml Nml Nml Nml N/A  Left Abd Poll Brev Nml Nml Nml Nml Nml Nml Nml Nml Nml Nml Nml Nml N/A  Left PronatorTeres Nml Nml Nml Nml Nml Nml Nml Nml Nml Nml Nml Nml N/A  Left Biceps Nml Nml Nml Nml Nml Nml Nml Nml Nml Nml Nml Nml N/A  Left Triceps Nml Nml Nml Nml Nml Nml Nml Nml Nml Nml Nml Nml N/A  Left Deltoid Nml Nml Nml Nml Nml Nml Nml Nml Nml Nml Nml Nml N/A      Waveforms:

## 2018-02-26 ENCOUNTER — Telehealth: Payer: Self-pay

## 2018-02-26 NOTE — Telephone Encounter (Signed)
-----   Message from Alda Berthold, DO sent at 02/25/2018  4:13 PM EDT ----- Please inform patient that nerve testing shows very mild carpal tunnel syndrome.  Recommend using wrist braces/splints at night time.  Thanks.

## 2018-02-26 NOTE — Telephone Encounter (Signed)
Called Pt, advised her of EMG results and wrist splint recommendations

## 2018-04-22 ENCOUNTER — Ambulatory Visit: Payer: Self-pay | Admitting: Internal Medicine

## 2018-04-22 ENCOUNTER — Ambulatory Visit (INDEPENDENT_AMBULATORY_CARE_PROVIDER_SITE_OTHER): Payer: Medicare Other | Admitting: Cardiology

## 2018-04-22 ENCOUNTER — Encounter: Payer: Self-pay | Admitting: Cardiology

## 2018-04-22 VITALS — BP 131/79 | HR 67 | Ht 62.0 in | Wt 165.8 lb

## 2018-04-22 DIAGNOSIS — R931 Abnormal findings on diagnostic imaging of heart and coronary circulation: Secondary | ICD-10-CM
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
## 2018-04-22 NOTE — Patient Instructions (Signed)
Medication Instructions:  Continue current medications  If you need a refill on your cardiac medications before your next appointment, please call your pharmacy.  Labwork: None ordered   Testing/Procedures: None Ordered   Follow-Up: Your physician wants you to follow-up in: As Needed.      Thank you for choosing [facility] at [facility]!!

## 2018-05-13 ENCOUNTER — Telehealth: Payer: Self-pay | Admitting: Cardiology

## 2018-05-13 NOTE — Telephone Encounter (Signed)
Called patient, after speaking with Dr.Hochrein's assistant, and checking the back fax myself, I advised that I had not seen these results. I advised her to call the primary care and have them fax it again attention it to [facility] so that it came to her.   Patient verbalized understanding.

## 2018-05-13 NOTE — Telephone Encounter (Signed)
New message   Patient wants to know if Echo and EKG  from [facility] in [place], [place] was received. Please advise.

## 2018-05-15 ENCOUNTER — Telehealth: Payer: Self-pay | Admitting: Cardiology

## 2018-05-15 DIAGNOSIS — R011 Cardiac murmur, unspecified: Secondary | ICD-10-CM

## 2018-05-15 DIAGNOSIS — I6523 Occlusion and stenosis of bilateral carotid arteries: Secondary | ICD-10-CM

## 2018-05-15 NOTE — Telephone Encounter (Signed)
Reviewed and instructions given.

## 2018-05-15 NOTE — Telephone Encounter (Signed)
Pt called and was very upset that we have not got back to her about the testing she has at an off site location..carotid ultrasound, echo, and EKG.. I advised her that we have received the reports and Dr. Percival Spanish will review them. Pt says she is anxious about the reports and would be okay with Dr. Percival Spanish reordering all of the tests regardless of her Insurance. I told her that I will forward to him to see if that is something that he would like to proceed with. Pt may try to go back and see if she can get the tests films herself and bring to our office. Pt says she will let us know and will also like to hear back from Dr. Percival Spanish re: his recommendations to redo the tests.

## 2018-05-15 NOTE — Telephone Encounter (Signed)
New Message:   Patient is calling for EKG and ECHO results

## 2018-05-16 NOTE — Telephone Encounter (Signed)
Spoke with pt. Adv her that copies of her previous echo, carotid doppler, and lab work were received and reviewed by Dr.Hochrein.  Per Dr.Hochrein's recommendation. We should repeat an echo and carotid doppler.  Pt is in agreement. Adv pt that I will place the order and fwd a message to scheduling to call her to schedule her test. Adv her we will call her with her results and Dr.Hochrein's plan of care.  Pt is agreeable with plan and voiced appreciation for the call.

## 2018-05-21 ENCOUNTER — Other Ambulatory Visit: Payer: Self-pay

## 2018-05-21 ENCOUNTER — Ambulatory Visit (HOSPITAL_COMMUNITY)
Admission: RE | Admit: 2018-05-21 | Discharge: 2018-05-21 | Disposition: A | Discharge status: Home / Self Care | Payer: Medicare Other | Source: Ambulatory Visit | Attending: Cardiovascular Disease | Admitting: Cardiovascular Disease

## 2018-05-21 ENCOUNTER — Ambulatory Visit (HOSPITAL_BASED_OUTPATIENT_CLINIC_OR_DEPARTMENT_OTHER): Payer: Medicare Other

## 2018-05-21 DIAGNOSIS — R011 Cardiac murmur, unspecified: Secondary | ICD-10-CM | POA: Insufficient documentation

## 2018-05-21 DIAGNOSIS — I6523 Occlusion and stenosis of bilateral carotid arteries: Secondary | ICD-10-CM | POA: Diagnosis not present

## 2018-05-23 DIAGNOSIS — C44319 Basal cell carcinoma of skin of other parts of face: Secondary | ICD-10-CM | POA: Diagnosis not present

## 2018-05-23 DIAGNOSIS — L821 Other seborrheic keratosis: Secondary | ICD-10-CM | POA: Diagnosis not present

## 2018-05-23 DIAGNOSIS — Z85828 Personal history of other malignant neoplasm of skin: Secondary | ICD-10-CM | POA: Diagnosis not present

## 2018-05-28 ENCOUNTER — Telehealth: Payer: Self-pay | Admitting: Cardiology

## 2018-05-28 NOTE — Telephone Encounter (Signed)
Pt aware of echo and carotid results with verbal understanding.  Notes recorded by Minus Breeding, MD on 05/24/2018 at 10:26 AM EDT No significant abnormalities. EF is OK. Mild LVH. No change in therapy. No further testing. Call Ms. Ulis Rias with the results   Notes recorded by Minus Breeding, MD on 05/24/2018 at 10:19 AM EDT No significant carotid disease. No change in therapy. No further testing. Call Ms. Ulis Rias with the results   Pt sts that she doesn't currently have a pcp, she no longer sees Threasa Alpha, [place]. She plans on establishing with a new pcp soon.

## 2018-05-28 NOTE — Telephone Encounter (Signed)
New Message:     Patient is calling for ECHO Results

## 2018-05-30 ENCOUNTER — Other Ambulatory Visit: Payer: Self-pay

## 2018-05-30 DIAGNOSIS — Z1231 Encounter for screening mammogram for malignant neoplasm of breast: Secondary | ICD-10-CM

## 2018-06-09 DIAGNOSIS — F331 Major depressive disorder, recurrent, moderate: Secondary | ICD-10-CM | POA: Diagnosis not present

## 2018-06-26 DIAGNOSIS — M797 Fibromyalgia: Secondary | ICD-10-CM | POA: Diagnosis not present

## 2018-06-26 DIAGNOSIS — Z20818 Contact with and (suspected) exposure to other bacterial communicable diseases: Secondary | ICD-10-CM | POA: Diagnosis not present

## 2018-06-26 DIAGNOSIS — F329 Major depressive disorder, single episode, unspecified: Secondary | ICD-10-CM | POA: Diagnosis not present

## 2018-06-26 DIAGNOSIS — I517 Cardiomegaly: Secondary | ICD-10-CM | POA: Diagnosis not present

## 2018-06-26 DIAGNOSIS — R931 Abnormal findings on diagnostic imaging of heart and coronary circulation: Secondary | ICD-10-CM | POA: Diagnosis not present

## 2018-06-26 DIAGNOSIS — N200 Calculus of kidney: Secondary | ICD-10-CM | POA: Diagnosis not present

## 2018-06-26 DIAGNOSIS — J019 Acute sinusitis, unspecified: Secondary | ICD-10-CM | POA: Diagnosis not present

## 2018-06-26 DIAGNOSIS — N390 Urinary tract infection, site not specified: Secondary | ICD-10-CM | POA: Diagnosis not present

## 2018-06-26 DIAGNOSIS — Z1389 Encounter for screening for other disorder: Secondary | ICD-10-CM | POA: Diagnosis not present

## 2018-06-26 DIAGNOSIS — E039 Hypothyroidism, unspecified: Secondary | ICD-10-CM | POA: Diagnosis not present

## 2018-06-26 DIAGNOSIS — R030 Elevated blood-pressure reading, without diagnosis of hypertension: Secondary | ICD-10-CM | POA: Diagnosis not present

## 2018-06-26 DIAGNOSIS — K219 Gastro-esophageal reflux disease without esophagitis: Secondary | ICD-10-CM | POA: Diagnosis not present

## 2018-06-26 DIAGNOSIS — J45909 Unspecified asthma, uncomplicated: Secondary | ICD-10-CM | POA: Diagnosis not present

## 2018-06-26 DIAGNOSIS — Z112 Encounter for screening for other bacterial diseases: Secondary | ICD-10-CM | POA: Diagnosis not present

## 2018-06-26 DIAGNOSIS — R300 Dysuria: Secondary | ICD-10-CM | POA: Diagnosis not present

## 2018-06-26 DIAGNOSIS — Z79899 Other long term (current) drug therapy: Secondary | ICD-10-CM | POA: Diagnosis not present

## 2018-06-26 DIAGNOSIS — E7800 Pure hypercholesterolemia, unspecified: Secondary | ICD-10-CM | POA: Diagnosis not present

## 2018-06-26 DIAGNOSIS — Z23 Encounter for immunization: Secondary | ICD-10-CM | POA: Diagnosis not present

## 2018-06-26 DIAGNOSIS — Z0000 Encounter for general adult medical examination without abnormal findings: Secondary | ICD-10-CM | POA: Diagnosis not present

## 2018-06-26 DIAGNOSIS — Z118 Encounter for screening for other infectious and parasitic diseases: Secondary | ICD-10-CM | POA: Diagnosis not present

## 2018-06-26 DIAGNOSIS — E042 Nontoxic multinodular goiter: Secondary | ICD-10-CM | POA: Diagnosis not present

## 2018-07-15 ENCOUNTER — Ambulatory Visit: Payer: Medicare Other | Admitting: General Surgery

## 2018-07-28 DIAGNOSIS — M79661 Pain in right lower leg: Secondary | ICD-10-CM | POA: Diagnosis not present

## 2018-07-28 DIAGNOSIS — G608 Other hereditary and idiopathic neuropathies: Secondary | ICD-10-CM | POA: Diagnosis not present

## 2018-07-28 DIAGNOSIS — G603 Idiopathic progressive neuropathy: Secondary | ICD-10-CM | POA: Diagnosis not present

## 2018-07-30 ENCOUNTER — Ambulatory Visit
Admission: RE | Admit: 2018-07-30 | Discharge: 2018-07-30 | Disposition: A | Discharge status: Home / Self Care | Payer: Medicare Other | Source: Ambulatory Visit | Attending: General Surgery | Admitting: General Surgery

## 2018-07-30 DIAGNOSIS — Z1231 Encounter for screening mammogram for malignant neoplasm of breast: Secondary | ICD-10-CM | POA: Diagnosis not present

## 2018-08-03 HISTORY — PX: SKIN LESION EXCISION: SHX2412

## 2018-08-04 DIAGNOSIS — L988 Other specified disorders of the skin and subcutaneous tissue: Secondary | ICD-10-CM | POA: Diagnosis not present

## 2018-08-04 DIAGNOSIS — C44319 Basal cell carcinoma of skin of other parts of face: Secondary | ICD-10-CM | POA: Diagnosis not present

## 2018-08-04 DIAGNOSIS — L814 Other melanin hyperpigmentation: Secondary | ICD-10-CM | POA: Diagnosis not present

## 2018-08-04 DIAGNOSIS — L578 Other skin changes due to chronic exposure to nonionizing radiation: Secondary | ICD-10-CM | POA: Diagnosis not present

## 2018-08-12 ENCOUNTER — Other Ambulatory Visit: Payer: Self-pay

## 2018-08-12 ENCOUNTER — Ambulatory Visit (INDEPENDENT_AMBULATORY_CARE_PROVIDER_SITE_OTHER): Payer: Medicare Other | Admitting: General Surgery

## 2018-08-12 ENCOUNTER — Encounter: Payer: Self-pay | Admitting: General Surgery

## 2018-08-12 ENCOUNTER — Ambulatory Visit (INDEPENDENT_AMBULATORY_CARE_PROVIDER_SITE_OTHER): Payer: Medicare Other

## 2018-08-12 VITALS — BP 138/78 | HR 84 | Temp 97.7°F | Resp 16 | Ht 62.0 in | Wt 166.0 lb

## 2018-08-12 DIAGNOSIS — N6321 Unspecified lump in the left breast, upper outer quadrant: Secondary | ICD-10-CM

## 2018-08-12 DIAGNOSIS — N6002 Solitary cyst of left breast: Secondary | ICD-10-CM

## 2018-08-12 DIAGNOSIS — Z1231 Encounter for screening mammogram for malignant neoplasm of breast: Secondary | ICD-10-CM

## 2018-08-12 DIAGNOSIS — I6523 Occlusion and stenosis of bilateral carotid arteries: Secondary | ICD-10-CM

## 2018-08-12 NOTE — Patient Instructions (Addendum)
The patient is aware to call back for any questions or new concerns. Patient will be asked to return to the office in one year with a bilateral screening mammogram.  
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
## 2018-08-13 DIAGNOSIS — N6002 Solitary cyst of left breast: Secondary | ICD-10-CM | POA: Insufficient documentation

## 2018-08-18 ENCOUNTER — Ambulatory Visit: Payer: Medicare Other | Admitting: Obstetrics & Gynecology

## 2018-09-18 ENCOUNTER — Other Ambulatory Visit (HOSPITAL_COMMUNITY)
Admission: RE | Admit: 2018-09-18 | Discharge: 2018-09-18 | Disposition: A | Discharge status: Home / Self Care | Payer: Medicare Other | Source: Ambulatory Visit | Attending: Obstetrics & Gynecology | Admitting: Obstetrics & Gynecology

## 2018-09-18 ENCOUNTER — Ambulatory Visit (INDEPENDENT_AMBULATORY_CARE_PROVIDER_SITE_OTHER): Payer: Medicare Other | Admitting: Obstetrics & Gynecology

## 2018-09-18 ENCOUNTER — Encounter: Payer: Self-pay | Admitting: *Deleted

## 2018-09-18 ENCOUNTER — Encounter: Payer: Self-pay | Admitting: Obstetrics & Gynecology

## 2018-09-18 VITALS — BP 150/80 | Ht 62.5 in | Wt 169.0 lb

## 2018-09-18 DIAGNOSIS — Z1211 Encounter for screening for malignant neoplasm of colon: Secondary | ICD-10-CM

## 2018-09-18 DIAGNOSIS — Z124 Encounter for screening for malignant neoplasm of cervix: Secondary | ICD-10-CM | POA: Insufficient documentation

## 2018-09-18 DIAGNOSIS — N950 Postmenopausal bleeding: Secondary | ICD-10-CM | POA: Insufficient documentation

## 2018-09-18 DIAGNOSIS — Z01419 Encounter for gynecological examination (general) (routine) without abnormal findings: Secondary | ICD-10-CM

## 2018-09-18 DIAGNOSIS — Z1239 Encounter for other screening for malignant neoplasm of breast: Secondary | ICD-10-CM | POA: Diagnosis not present

## 2018-09-18 DIAGNOSIS — R1010 Upper abdominal pain, unspecified: Secondary | ICD-10-CM

## 2018-09-18 NOTE — Patient Instructions (Addendum)
PAP every three years, done today Mammogram every year, done 2020 Colonoscopy every 5 years, due 2022 Labs yearly (with PCP) Plan Ultrasound to reassure as to uterine bleeding
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
## 2018-09-24 LAB — CYTOLOGY - PAP
Diagnosis: NEGATIVE
HPV: NOT DETECTED

## 2018-09-30 ENCOUNTER — Ambulatory Visit: Payer: Medicare Other | Admitting: Obstetrics & Gynecology

## 2018-09-30 ENCOUNTER — Ambulatory Visit (INDEPENDENT_AMBULATORY_CARE_PROVIDER_SITE_OTHER): Payer: Medicare Other

## 2018-09-30 DIAGNOSIS — N950 Postmenopausal bleeding: Secondary | ICD-10-CM

## 2018-10-01 ENCOUNTER — Telehealth: Payer: Self-pay

## 2018-10-01 NOTE — Telephone Encounter (Signed)
Pt called yesterday to after hour nurse at 4:57pm returning a call.  [phone]  Adv pt of negative pap smear and reminded her to do her stool test.  She asked about u/s results.  Adv PH may not have seen them yet.  PH is out of office today but will send him a msg.

## 2018-10-02 NOTE — Telephone Encounter (Signed)
Ultrasound normal, let her know.

## 2018-10-02 NOTE — Telephone Encounter (Signed)
Pt aware.

## 2018-10-15 ENCOUNTER — Ambulatory Visit (INDEPENDENT_AMBULATORY_CARE_PROVIDER_SITE_OTHER): Payer: Medicare Other | Admitting: Gastroenterology

## 2018-10-15 ENCOUNTER — Encounter: Payer: Self-pay | Admitting: Gastroenterology

## 2018-10-15 ENCOUNTER — Telehealth: Payer: Self-pay | Admitting: Gastroenterology

## 2018-10-15 VITALS — BP 143/84 | HR 81 | Ht 62.5 in | Wt 165.6 lb

## 2018-10-15 DIAGNOSIS — R1013 Epigastric pain: Secondary | ICD-10-CM | POA: Diagnosis not present

## 2018-10-15 DIAGNOSIS — R1084 Generalized abdominal pain: Secondary | ICD-10-CM | POA: Diagnosis not present

## 2018-10-15 NOTE — Telephone Encounter (Signed)
Pt is checking  out  She is requesting not to have her Results on Mychart instead would like a call or voice Mail
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
## 2018-10-15 NOTE — Addendum Note (Signed)
Addended by: Earl Lagos on: 10/15/2018 03:54 PM   Modules accepted: Orders

## 2018-10-21 LAB — H PYLORI, IGM, IGG, IGA AB
H pylori, IgM Abs: <9 units (ref 0.0–8.9)
H. pylori, IgA Abs: <9 units (ref 0.0–8.9)
H. pylori, IgG AbS: 0.14 Index Value (ref 0.00–0.79)

## 2018-10-21 NOTE — Telephone Encounter (Signed)
Pt left vm  for her Lab results please call pt

## 2018-10-22 NOTE — Telephone Encounter (Signed)
Patient called and is anxious about her results.

## 2018-10-28 NOTE — Telephone Encounter (Signed)
Patient came to the office to obtain lab results. Ginger stated I could give her the negative results to the [employer]. The patient states understanding & would like to know what is the next step. Please advise

## 2018-10-29 ENCOUNTER — Ambulatory Visit: Payer: Self-pay | Admitting: Family Medicine
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
## 2018-10-31 MED ORDER — PANTOPRAZOLE SODIUM 40 MG PO TBEC: 40.0000 mg | DELAYED_RELEASE_TABLET | Freq: Two times a day (BID) | ORAL | 0 refills | Status: DC

## 2018-10-31 NOTE — Telephone Encounter (Signed)
Pt notified and that rx to increase her protonix to twice daily for 30 days .

## 2018-10-31 NOTE — Telephone Encounter (Signed)
-----   Message from Virgel Manifold, MD sent at 10/29/2018  4:29 PM EST ----- Alis Sawchuk please let patient know, her blood work did not show infection with H. pylori.  We can go ahead and increase her Protonix to twice daily for 30 days only to see if it helps with her abdominal pain.

## 2019-01-07 ENCOUNTER — Encounter: Payer: Self-pay | Admitting: Family Medicine

## 2019-01-07 ENCOUNTER — Ambulatory Visit (INDEPENDENT_AMBULATORY_CARE_PROVIDER_SITE_OTHER): Payer: Medicare Other | Admitting: Family Medicine

## 2019-01-07 DIAGNOSIS — N6002 Solitary cyst of left breast: Secondary | ICD-10-CM | POA: Diagnosis not present

## 2019-01-07 DIAGNOSIS — E7800 Pure hypercholesterolemia, unspecified: Secondary | ICD-10-CM

## 2019-01-07 DIAGNOSIS — M797 Fibromyalgia: Secondary | ICD-10-CM

## 2019-01-07 DIAGNOSIS — N6019 Diffuse cystic mastopathy of unspecified breast: Secondary | ICD-10-CM

## 2019-01-07 DIAGNOSIS — E039 Hypothyroidism, unspecified: Secondary | ICD-10-CM | POA: Diagnosis not present

## 2019-01-07 DIAGNOSIS — Z8673 Personal history of transient ischemic attack (TIA), and cerebral infarction without residual deficits: Secondary | ICD-10-CM

## 2019-01-07 DIAGNOSIS — G43009 Migraine without aura, not intractable, without status migrainosus: Secondary | ICD-10-CM

## 2019-01-07 DIAGNOSIS — F4310 Post-traumatic stress disorder, unspecified: Secondary | ICD-10-CM | POA: Diagnosis not present

## 2019-01-07 DIAGNOSIS — J4530 Mild persistent asthma, uncomplicated: Secondary | ICD-10-CM
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
## 2019-01-09 ENCOUNTER — Encounter: Payer: Self-pay | Admitting: Family Medicine

## 2019-01-09 DIAGNOSIS — N6019 Diffuse cystic mastopathy of unspecified breast: Secondary | ICD-10-CM | POA: Insufficient documentation

## 2019-01-09 DIAGNOSIS — Z8673 Personal history of transient ischemic attack (TIA), and cerebral infarction without residual deficits: Secondary | ICD-10-CM | POA: Insufficient documentation

## 2019-01-09 DIAGNOSIS — J45909 Unspecified asthma, uncomplicated: Secondary | ICD-10-CM | POA: Insufficient documentation

## 2019-01-09 DIAGNOSIS — F4310 Post-traumatic stress disorder, unspecified: Secondary | ICD-10-CM | POA: Insufficient documentation

## 2019-01-09 DIAGNOSIS — E039 Hypothyroidism, unspecified: Secondary | ICD-10-CM | POA: Insufficient documentation

## 2019-01-09 DIAGNOSIS — E7800 Pure hypercholesterolemia, unspecified: Secondary | ICD-10-CM | POA: Insufficient documentation

## 2019-01-09 MED ORDER — LEVALBUTEROL TARTRATE 45 MCG/ACT IN AERO: 2.0000 | INHALATION_SPRAY | RESPIRATORY_TRACT | 2 refills | Status: DC | PRN

## 2019-01-09 MED ORDER — PANTOPRAZOLE SODIUM 40 MG PO TBEC: 40.0000 mg | DELAYED_RELEASE_TABLET | Freq: Two times a day (BID) | ORAL | 5 refills | Status: DC

## 2019-01-09 NOTE — Assessment & Plan Note (Signed)
Previous biopsy was benign Followed by general surgery Annual mammograms

## 2019-01-09 NOTE — Assessment & Plan Note (Signed)
Chronic and well-controlled Continue Symbicort Continue Xopenex as needed

## 2019-01-09 NOTE — Assessment & Plan Note (Signed)
Chronic and well-controlled Discussed the benefits of exercise Continue gabapentin and Cymbalta

## 2019-01-09 NOTE — Assessment & Plan Note (Signed)
Previously well controlled We will request records from previous PCP Plan to recheck TSH at next in-person visit Continue levothyroxine at current dose

## 2019-01-09 NOTE — Assessment & Plan Note (Signed)
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

## 2019-01-09 NOTE — Assessment & Plan Note (Signed)
Monitored annually with breast exams with general surgery Annual mammograms
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
## 2019-01-09 NOTE — Assessment & Plan Note (Signed)
Followed by neurology Continue Cymbalta

## 2019-01-09 NOTE — Assessment & Plan Note (Signed)
Patient reports some minimal residual deficits Continue Lipitor, Plavix Discussed importance of blood pressure and cholesterol control

## 2019-01-16 ENCOUNTER — Other Ambulatory Visit: Payer: Self-pay | Admitting: Family Medicine

## 2019-01-21 ENCOUNTER — Other Ambulatory Visit: Payer: Self-pay | Admitting: Family Medicine

## 2019-02-02 ENCOUNTER — Ambulatory Visit: Payer: Medicare Other | Admitting: Neurology
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
## 2019-02-03 ENCOUNTER — Telehealth (INDEPENDENT_AMBULATORY_CARE_PROVIDER_SITE_OTHER): Payer: Medicare Other | Admitting: Neurology

## 2019-02-03 ENCOUNTER — Other Ambulatory Visit: Payer: Self-pay

## 2019-02-03 ENCOUNTER — Encounter: Payer: Self-pay | Admitting: Neurology

## 2019-02-03 VITALS — Temp 97.4°F | Ht 62.5 in | Wt 165.0 lb

## 2019-02-03 DIAGNOSIS — G4485 Primary stabbing headache: Secondary | ICD-10-CM

## 2019-02-03 DIAGNOSIS — R413 Other amnesia: Secondary | ICD-10-CM

## 2019-02-04 NOTE — Addendum Note (Signed)
Addended by: Clois Comber on: 02/04/2019 01:16 PM   Modules accepted: Orders

## 2019-02-15 ENCOUNTER — Other Ambulatory Visit: Payer: Self-pay | Admitting: Neurology

## 2019-02-15 DIAGNOSIS — G43009 Migraine without aura, not intractable, without status migrainosus: Secondary | ICD-10-CM

## 2019-03-04 ENCOUNTER — Other Ambulatory Visit: Payer: Self-pay

## 2019-03-05 MED ORDER — CLOPIDOGREL BISULFATE 75 MG PO TABS: 75.0000 mg | ORAL_TABLET | Freq: Every day | ORAL | 3 refills | Status: DC

## 2019-03-24 ENCOUNTER — Encounter: Payer: Self-pay | Admitting: General Surgery

## 2019-04-23 ENCOUNTER — Other Ambulatory Visit: Payer: Self-pay | Admitting: Family Medicine

## 2019-04-29 ENCOUNTER — Encounter: Payer: Self-pay | Admitting: Psychology

## 2019-04-29 ENCOUNTER — Other Ambulatory Visit: Payer: Self-pay

## 2019-04-29 ENCOUNTER — Ambulatory Visit (INDEPENDENT_AMBULATORY_CARE_PROVIDER_SITE_OTHER): Payer: Medicare Other | Admitting: Psychology

## 2019-04-29 ENCOUNTER — Ambulatory Visit: Payer: Medicare Other | Admitting: Psychology

## 2019-04-29 DIAGNOSIS — F411 Generalized anxiety disorder: Secondary | ICD-10-CM

## 2019-04-29 DIAGNOSIS — F4310 Post-traumatic stress disorder, unspecified: Secondary | ICD-10-CM

## 2019-04-29 DIAGNOSIS — F330 Major depressive disorder, recurrent, mild: Secondary | ICD-10-CM | POA: Diagnosis not present

## 2019-04-29 DIAGNOSIS — R413 Other amnesia: Secondary | ICD-10-CM

## 2019-04-29 DIAGNOSIS — F410 Panic disorder [episodic paroxysmal anxiety] without agoraphobia: Secondary | ICD-10-CM | POA: Diagnosis not present
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
## 2019-04-29 NOTE — Progress Notes (Signed)
   Neuropsychology Note   RABECKA SHATTUCK completed 80 minutes of neuropsychological testing with technician, Milana Kidney, B.S., under the supervision of Dr. Christia Reading, Ph.D., licensed psychologist. The patient did not appear overtly distressed by the testing session, per behavioral observation or via self-report to the technician. Rest breaks were offered.    In considering the patient's current level of functioning, level of presumed impairment, nature of symptoms, emotional and behavioral responses during the interview, level of literacy, and observed level of motivation/effort, a battery of tests was selected and communicated to the psychometrician.   Communication between the psychologist and technician was ongoing throughout the testing session and changes were made as deemed necessary based on patient performance on testing, technician observations and additional pertinent factors such as those listed above.   MAIZY FORNASH will return within approximately two weeks for an interactive feedback session with Dr. Melvyn Novas at which time his test performances, clinical impressions, and treatment recommendations will be reviewed in detail. The patient understands she can contact our office should she require our assistance before this time.   Full report to follow.  80 minutes were spent face-to-face with patient administering standardized tests and 15 minutes were spent scoring (technician). [CPT [id number], [id number]

## 2019-04-30 ENCOUNTER — Encounter: Payer: Self-pay | Admitting: Psychology

## 2019-04-30 DIAGNOSIS — F411 Generalized anxiety disorder: Secondary | ICD-10-CM

## 2019-04-30 DIAGNOSIS — F410 Panic disorder [episodic paroxysmal anxiety] without agoraphobia: Secondary | ICD-10-CM

## 2019-04-30 HISTORY — DX: Generalized anxiety disorder: F41.1

## 2019-04-30 HISTORY — DX: Panic disorder (episodic paroxysmal anxiety): F41.0

## 2019-05-07 ENCOUNTER — Encounter: Payer: Medicare Other | Admitting: Psychology

## 2019-05-19 ENCOUNTER — Other Ambulatory Visit: Payer: Self-pay

## 2019-05-19 DIAGNOSIS — Z1231 Encounter for screening mammogram for malignant neoplasm of breast: Secondary | ICD-10-CM

## 2019-05-26 ENCOUNTER — Other Ambulatory Visit: Payer: Self-pay

## 2019-05-26 ENCOUNTER — Ambulatory Visit (INDEPENDENT_AMBULATORY_CARE_PROVIDER_SITE_OTHER): Payer: Medicare Other | Admitting: Psychology

## 2019-05-26 ENCOUNTER — Encounter: Payer: Self-pay | Admitting: Psychology

## 2019-05-26 DIAGNOSIS — F410 Panic disorder [episodic paroxysmal anxiety] without agoraphobia: Secondary | ICD-10-CM

## 2019-05-26 DIAGNOSIS — F411 Generalized anxiety disorder: Secondary | ICD-10-CM

## 2019-05-26 DIAGNOSIS — F4310 Post-traumatic stress disorder, unspecified: Secondary | ICD-10-CM

## 2019-05-26 NOTE — Progress Notes (Signed)
   NEUROPSYCHOLOGICAL EVALUATION - Feedback [name] [place] Department of Neurology  Reason for Referral:   Nichole [name] a 78 y.o. Caucasian female referred by Metta Clines, D.O.,to characterize hercurrent cognitive functioning and assist with diagnostic clarity and treatment planning in the context of subjective decline, a history of a TIA, and several medical and psychiatric comorbidities.  Feedback:   Nichole [name] completed a comprehensive neuropsychological evaluation on 04/29/2019. Briefly, results suggested neuropsychological functioning within normal limits with several areas of noted strength. Relative weaknesses were exhibited across domains of processing speed and verbal fluency; however, scores remained largely within appropriate normative ranges. Regarding her mood, responses across self-report questionnaires suggested mild symptoms of depression, severe symptoms of anxiety, and symptoms consistent with an active diagnosis of PTSD. Recommendations included optimizing mood symptoms via medications and psychotherapy. Specific therapeutic modalities effective for treatment of PTSD (i.e., Cognitive Processing Therapy [CPT]) were identified.  Nichole [name] was unaccompanied. Content of the current session focused on treatment avenues for mood-related symptoms. Nichole [name] noted never formally treating symptoms of PTSD. As such, active treatment in this area would likely yield significant positive benefits. Nichole [name] was given the opportunity to ask questions and all her questions were answered. She was also encouraged to reach out should additional questions arise. A copy of her report was provided at the conclusion of the visit.      A total of 20 minutes were spent with Nichole [name] during the current feedback session.

## 2019-05-30 ENCOUNTER — Other Ambulatory Visit: Payer: Self-pay | Admitting: Family Medicine

## 2019-06-19 ENCOUNTER — Telehealth: Payer: Self-pay | Admitting: Neurology

## 2019-06-19 NOTE — Telephone Encounter (Signed)
Spoke to patient and she stated she had two episodes only lasting a minute each where the words wouldn't come out right. Also had one time her Right hand couldn't pick something up off the floor.  Patient also said she is going to ask her psychiatrist if he can increase her buspirone.  Spoke to Dr. Tomi Likens and called patient back. Informed her he stated she is already on the Plavix blood thinner so his recommendation is just to monitor and call us back if increases. Reviewed s/s of stroke with patient and told her if any concern of CVA to go to the ER. Verbalized understanding. No further questions.

## 2019-06-19 NOTE — Telephone Encounter (Signed)
Patient called and requested a call back from the nurse.   She said her children insisted she call to let Dr. Tomi [name] know she has a history of TIA and some concerns have presented the last few days. She said she was trying to talk the other day and her mouth would not form the words. She said this happened again the next day and then her right hand felt weak and dropped to the floor but she could not pick it up.

## 2019-07-02 ENCOUNTER — Other Ambulatory Visit: Payer: Self-pay | Admitting: Family Medicine

## 2019-07-06 ENCOUNTER — Other Ambulatory Visit: Payer: Self-pay | Admitting: Family Medicine

## 2019-08-03 ENCOUNTER — Ambulatory Visit
Admission: RE | Admit: 2019-08-03 | Discharge: 2019-08-03 | Disposition: A | Discharge status: Home / Self Care | Payer: Medicare Other | Source: Ambulatory Visit | Attending: Surgery | Admitting: Surgery

## 2019-08-03 DIAGNOSIS — Z1231 Encounter for screening mammogram for malignant neoplasm of breast: Secondary | ICD-10-CM | POA: Insufficient documentation

## 2019-08-04 ENCOUNTER — Other Ambulatory Visit: Payer: Self-pay | Admitting: Surgery

## 2019-08-04 DIAGNOSIS — N6320 Unspecified lump in the left breast, unspecified quadrant: Secondary | ICD-10-CM

## 2019-08-04 DIAGNOSIS — R928 Other abnormal and inconclusive findings on diagnostic imaging of breast: Secondary | ICD-10-CM

## 2019-08-07 ENCOUNTER — Ambulatory Visit (INDEPENDENT_AMBULATORY_CARE_PROVIDER_SITE_OTHER): Payer: Medicare Other | Admitting: Family Medicine

## 2019-08-07 ENCOUNTER — Other Ambulatory Visit: Payer: Self-pay

## 2019-08-07 ENCOUNTER — Encounter: Payer: Self-pay | Admitting: Family Medicine

## 2019-08-07 VITALS — BP 127/74 | HR 79 | Temp 96.8°F | Wt 168.0 lb

## 2019-08-07 DIAGNOSIS — Z79899 Other long term (current) drug therapy: Secondary | ICD-10-CM

## 2019-08-07 DIAGNOSIS — E7800 Pure hypercholesterolemia, unspecified: Secondary | ICD-10-CM

## 2019-08-07 DIAGNOSIS — N3090 Cystitis, unspecified without hematuria: Secondary | ICD-10-CM | POA: Diagnosis not present

## 2019-08-07 DIAGNOSIS — E039 Hypothyroidism, unspecified: Secondary | ICD-10-CM

## 2019-08-07 LAB — POCT URINALYSIS DIPSTICK
Bilirubin, UA: NEGATIVE
Blood, UA: NEGATIVE
Glucose, UA: NEGATIVE
Ketones, UA: NEGATIVE
Nitrite, UA: NEGATIVE
Protein, UA: POSITIVE — AB
Spec Grav, UA: 1.01 (ref 1.010–1.025)
Urobilinogen, UA: 0.2 E.U./dL
pH, UA: 7 (ref 5.0–8.0)

## 2019-08-07 MED ORDER — LEVOTHYROXINE SODIUM 75 MCG PO TABS: ORAL_TABLET | ORAL | 3 refills | Status: DC

## 2019-08-07 MED ORDER — LEVOTHYROXINE SODIUM 88 MCG PO TABS: 88.0000 ug | ORAL_TABLET | ORAL | 3 refills | Status: DC

## 2019-08-07 MED ORDER — NITROFURANTOIN MONOHYD MACRO 100 MG PO CAPS: 100.0000 mg | ORAL_CAPSULE | Freq: Two times a day (BID) | ORAL | 0 refills | Status: AC

## 2019-08-07 MED ORDER — ATORVASTATIN CALCIUM 40 MG PO TABS: 40.0000 mg | ORAL_TABLET | Freq: Every day | ORAL | 3 refills | Status: DC

## 2019-08-07 NOTE — Assessment & Plan Note (Signed)
Previously well controlled Continue levothyroxine at current dose-alternating 75 and 88 mcg every other day New prescription sent to the pharmacy with clarifying notes to make sure that they are filling both doses of the prescription Recheck TSH

## 2019-08-07 NOTE — Assessment & Plan Note (Signed)
We had discussed continuing atorvastatin at her last visit, but seems that she has been out of her medication for about 6 months Encouraged her to call in the future if she runs out of her medication so that we can get her refill Will resume atorvastatin at previous dose Recheck metabolic panel and lipid panel

## 2019-08-07 NOTE — Progress Notes (Signed)
 Patient: Nichole [name] Female    DOB: September 20, 1940   78 y.o.   MRN: [id number] Visit Date: 08/07/2019  Today's Provider: Lavon Paganini, MD   No chief complaint on file.  Subjective:     Hyperlipidemia This is a chronic problem. Pertinent negatives include no shortness of breath. Current antihyperlipidemic treatment includes statins.  Thyroid Problem Presents for follow-up visit. Symptoms include weight gain. Patient reports no diaphoresis, fatigue, hair loss, heat intolerance, palpitations, tremors, visual change or weight loss. Her past medical history is significant for hyperlipidemia.  Urinary Tract Infection  This is a new problem. The current episode started in the past 7 days. The problem has been gradually worsening. Associated symptoms include frequency and urgency. Pertinent negatives include no chills, discharge, flank pain, hematuria or nausea.  symptoms started 2-3 days ago.  Has h/o UTIs, >1 yr since last one.  Also reports urinary incontinence only hen in the shower Denies SUI   Lab Results  Component Value Date   TSH 1.150 02/13/2018   Pt wants to inquire what kind of supplements she needs to take.  She is currently taking B12 and B6. Would recommend adding Vit D3 for bone health   Allergies  Allergen Reactions  . Ciprocinonide [Fluocinolone] Diarrhea  . Ciprofloxacin   . Flexeril [Cyclobenzaprine] Swelling  . Lyrica [Pregabalin] Swelling  . Savella [Milnacipran Hcl] Swelling  . Sulfa Antibiotics     Unknown  Childhood reaction, uncertain what it was  . Sulfamethoxazole     Reaction occurred as a child, pt doesn't know what the reaction was  . Keflex [Cephalexin] Rash     Current Outpatient Medications:  .  atorvastatin (LIPITOR) 40 MG tablet, , Disp: , Rfl:  .  BELSOMRA 10 MG TABS, , Disp: , Rfl:  .  budesonide (PULMICORT FLEXHALER) 180 MCG/ACT inhaler, Inhale 2 puffs into the lungs daily., Disp: , Rfl:  .  busPIRone (BUSPAR) 15 MG tablet,  , Disp: , Rfl:  .  clopidogrel (PLAVIX) 75 MG tablet, Take 1 tablet (75 mg total) by mouth daily. Please schedule office visit before any future refills, Disp: 30 tablet, Rfl: 0 .  DULoxetine (CYMBALTA) 60 MG capsule, TAKE 1 CAPSULE BY MOUTH EVERY DAY, Disp: 90 capsule, Rfl: 3 .  gabapentin (NEURONTIN) 600 MG tablet, TAKE 2 TABLETS BY MOUTH TWICE A DAY, Disp: 120 tablet, Rfl: 2 .  levothyroxine (SYNTHROID) 75 MCG tablet, TAKE 1 TABLET BY MOUTH EVERY OTHER DAY IN THE MORNING ON AN EMPTY STOMACH, Disp: 15 tablet, Rfl: 3 .  levothyroxine (SYNTHROID) 88 MCG tablet, Take 1 tablet (88 mcg total) by mouth every other day. Please schedule office visit before any future refills, Disp: 15 tablet, Rfl: 0 .  pantoprazole (PROTONIX) 40 MG tablet, Take 1 tablet (40 mg total) by mouth 2 (two) times daily before a meal., Disp: 60 tablet, Rfl: 5 .  Budesonide-Formoterol Fumarate (SYMBICORT IN), Inhale into the lungs as needed., Disp: , Rfl:  .  levalbuterol (XOPENEX HFA) 45 MCG/ACT inhaler, Inhale 2 puffs into the lungs every 4 (four) hours as needed for wheezing or shortness of breath. (Patient not taking: Reported on 08/07/2019), Disp: 1 Inhaler, Rfl: 2 .  Naproxen Sodium 220 MG CAPS, Take 220 mg by mouth 2 (two) times daily., Disp: , Rfl:  .  polyethylene glycol powder (GLYCOLAX/MIRALAX) powder, Take 255 g by mouth once., Disp: 255 g, Rfl: 0  Review of Systems  Constitutional: Positive for weight gain. Negative for  activity change, appetite change, chills, diaphoresis, fatigue, fever, unexpected weight change and weight loss.  HENT: Positive for postnasal drip. Negative for congestion and sore throat.   Respiratory: Positive for cough. Negative for apnea, choking, chest tightness, shortness of breath, wheezing and stridor.   Cardiovascular: Negative.  Negative for palpitations.  Gastrointestinal: Negative.  Negative for nausea.  Endocrine: Negative for heat intolerance.  Genitourinary: Positive for frequency and  urgency. Negative for decreased urine volume, difficulty urinating, dyspareunia, dysuria, enuresis, flank pain, hematuria, vaginal bleeding, vaginal discharge and vaginal pain.  Neurological: Negative for dizziness, tremors, light-headedness and headaches.    Social History   Tobacco Use  . Smoking status: Former Smoker    Quit date: 09/03/1961    Years since quitting: 57.9  . Smokeless tobacco: Never Used  . Tobacco comment: 10 months  Substance Use Topics  . Alcohol use: No    Alcohol/week: 0.0 standard drinks      Objective:   BP 127/74 (BP Location: Right Arm, Patient Position: Sitting, Cuff Size: Normal)   Pulse 79   Temp (!) 96.8 F (36 C) (Temporal)   Wt 168 lb (76.2 kg)   BMI 30.24 kg/m  Vitals:   08/07/19 1448  BP: 127/74  Pulse: 79  Temp: (!) 96.8 F (36 C)  TempSrc: Temporal  Weight: 168 lb (76.2 kg)  Body mass index is 30.24 kg/m.   Physical Exam Vitals signs reviewed.  Constitutional:      General: She is not in acute distress.    Appearance: Normal appearance. She is well-developed. She is not diaphoretic.  HENT:     Head: Normocephalic and atraumatic.     Right Ear: Tympanic membrane, ear canal and external ear normal.     Left Ear: Tympanic membrane, ear canal and external ear normal.  Eyes:     General: No scleral icterus.    Conjunctiva/sclera: Conjunctivae normal.     Pupils: Pupils are equal, round, and reactive to light.  Neck:     Musculoskeletal: Neck supple.     Thyroid: No thyromegaly.  Cardiovascular:     Rate and Rhythm: Normal rate and regular rhythm.     Pulses: Normal pulses.     Heart sounds: Normal heart sounds. No murmur.  Pulmonary:     Effort: Pulmonary effort is normal. No respiratory distress.     Breath sounds: Normal breath sounds. No wheezing or rales.  Abdominal:     General: There is no distension.     Palpations: Abdomen is soft.     Tenderness: There is no abdominal tenderness.  Musculoskeletal:         General: No deformity.     Right lower leg: No edema.     Left lower leg: No edema.  Lymphadenopathy:     Cervical: No cervical adenopathy.  Skin:    General: Skin is warm and dry.     Capillary Refill: Capillary refill takes less than 2 seconds.     Findings: No rash.  Neurological:     Mental Status: She is alert and oriented to person, place, and time. Mental status is at baseline.  Psychiatric:        Mood and Affect: Mood normal.        Behavior: Behavior normal.        Thought Content: Thought content normal.      No results found for any visits on 08/07/19.     Assessment & Plan   Problem List Items Addressed  This Visit      Endocrine   Hypothyroidism - Primary    Previously well controlled Continue levothyroxine at current dose-alternating 75 and 88 mcg every other day New prescription sent to the pharmacy with clarifying notes to make sure that they are filling both doses of the prescription Recheck TSH      Relevant Medications   levothyroxine (SYNTHROID) 88 MCG tablet   levothyroxine (SYNTHROID) 75 MCG tablet   Other Relevant Orders   TSH     Other   Hypercholesteremia    We had discussed continuing atorvastatin at her last visit, but seems that she has been out of her medication for about 6 months Encouraged her to call in the future if she runs out of her medication so that we can get her refill Will resume atorvastatin at previous dose Recheck metabolic panel and lipid panel      Relevant Medications   atorvastatin (LIPITOR) 40 MG tablet   Other Relevant Orders   Lipid panel   CMP (Comprehensive metabolic panel)    Other Visit Diagnoses    Cystitis - Symptoms and UA consistent with UTI -No systemic symptoms or signs of pyelonephritis -Will start treatment with 7 day course of Macrobid-no previous urine cultures available for review, but limited in antibiotic choice given her allergy list -We will send urine culture to confirm sensitivities  -Discussed return precautions        Relevant Orders   POCT Urinalysis Dipstick (Completed)   CULTURE, URINE COMPREHENSIVE   Long-term use of high-risk medication       Relevant Orders   CBC       Return in about 6 months (around 02/05/2020) for CPE/AWV.   The entirety of the information documented in the History of Present Illness, Review of Systems and Physical Exam were personally obtained by me. Portions of this information were initially documented by Ashley Royalty, CMA and reviewed by me for thoroughness and accuracy.    , Dionne Bucy, MD MPH [facility]

## 2019-08-10 LAB — CULTURE, URINE COMPREHENSIVE

## 2019-08-10 LAB — SPECIMEN STATUS REPORT

## 2019-08-11 ENCOUNTER — Ambulatory Visit: Payer: Medicare Other | Admitting: Surgery

## 2019-08-11 ENCOUNTER — Telehealth: Payer: Self-pay

## 2019-08-11 ENCOUNTER — Other Ambulatory Visit: Payer: Self-pay | Admitting: Family Medicine

## 2019-08-11 NOTE — Telephone Encounter (Signed)
LMTCB 08/11/2019  Thanks,   -Mickel Baas

## 2019-08-11 NOTE — Telephone Encounter (Signed)
-----   Message from Virginia Crews, MD sent at 08/11/2019  9:25 AM EST ----- Urine culture does not show 1 predominant bacteria, so no sensitivities were run.  Hope she is doing better after being treated with the antibiotic

## 2019-08-12 ENCOUNTER — Ambulatory Visit: Payer: Medicare Other | Admitting: Surgery

## 2019-08-12 ENCOUNTER — Other Ambulatory Visit: Payer: Medicare Other

## 2019-08-12 ENCOUNTER — Ambulatory Visit: Payer: Medicare Other

## 2019-08-12 NOTE — Telephone Encounter (Signed)
Patient advised as below. Patient reports she is feeling better.

## 2019-08-14 ENCOUNTER — Telehealth: Payer: Self-pay

## 2019-08-14 ENCOUNTER — Telehealth: Payer: Self-pay | Admitting: Family Medicine

## 2019-08-14 ENCOUNTER — Other Ambulatory Visit: Payer: Self-pay | Admitting: Family Medicine

## 2019-08-14 LAB — COMPREHENSIVE METABOLIC PANEL
ALT: 10 IU/L (ref 0–32)
AST: 19 IU/L (ref 0–40)
Albumin/Globulin Ratio: 2 (ref 1.2–2.2)
Albumin: 4.3 g/dL (ref 3.7–4.7)
Alkaline Phosphatase: 113 IU/L (ref 39–117)
BUN/Creatinine Ratio: 15 (ref 12–28)
BUN: 13 mg/dL (ref 8–27)
Bilirubin Total: 0.4 mg/dL (ref 0.0–1.2)
CO2: 22 mmol/L (ref 20–29)
Calcium: 9.1 mg/dL (ref 8.7–10.3)
Chloride: 102 mmol/L (ref 96–106)
Creatinine, Ser: 0.88 mg/dL (ref 0.57–1.00)
GFR calc Af Amer: 74 mL/min/{1.73_m2} (ref >59)
GFR calc non Af Amer: 64 mL/min/{1.73_m2} (ref >59)
Globulin, Total: 2.1 g/dL (ref 1.5–4.5)
Glucose: 101 mg/dL — ABNORMAL HIGH (ref 65–99)
Potassium: 3.8 mmol/L (ref 3.5–5.2)
Sodium: 141 mmol/L (ref 134–144)
Total Protein: 6.4 g/dL (ref 6.0–8.5)

## 2019-08-14 LAB — LIPID PANEL
Chol/HDL Ratio: 4.7 ratio — ABNORMAL HIGH (ref 0.0–4.4)
Cholesterol, Total: 218 mg/dL — ABNORMAL HIGH (ref 100–199)
HDL: 46 mg/dL (ref >39)
LDL Chol Calc (NIH): 132 mg/dL — ABNORMAL HIGH (ref 0–99)
Triglycerides: 224 mg/dL — ABNORMAL HIGH (ref 0–149)
VLDL Cholesterol Cal: 40 mg/dL (ref 5–40)

## 2019-08-14 LAB — CBC
Hematocrit: 36.9 % (ref 34.0–46.6)
Hemoglobin: 13 g/dL (ref 11.1–15.9)
MCH: 31.6 pg (ref 26.6–33.0)
MCHC: 35.2 g/dL (ref 31.5–35.7)
MCV: 90 fL (ref 79–97)
Platelets: 248 10*3/uL (ref 150–450)
RBC: 4.11 x10E6/uL (ref 3.77–5.28)
RDW: 12.8 % (ref 11.7–15.4)
WBC: 5.3 10*3/uL (ref 3.4–10.8)

## 2019-08-14 LAB — TSH: TSH: 4.91 u[IU]/mL — ABNORMAL HIGH (ref 0.450–4.500)

## 2019-08-14 MED ORDER — GABAPENTIN 600 MG PO TABS: 1200.0000 mg | ORAL_TABLET | Freq: Two times a day (BID) | ORAL | 2 refills | Status: DC

## 2019-08-14 NOTE — Telephone Encounter (Signed)
NA left message to call back for results. [facility] may give results.

## 2019-08-14 NOTE — Telephone Encounter (Signed)
Patient is calling back regarding CRM from office. Please advise [phone]

## 2019-08-14 NOTE — Telephone Encounter (Signed)
Medication: gabapentin (NEURONTIN) 600 MG tablet [id number]   Has the patient contacted their pharmacy? Yes  (Agent: If no, request that the patient contact the pharmacy for the refill.) (Agent: If yes, when and what did the pharmacy advise?)  Preferred Pharmacy (with phone number or street name): [id number] - [place], [place]  Phone:  [phone] Fax:  [phone]     Agent: Please be advised that RX refills may take up to 3 business days. We ask that you follow-up with your pharmacy.

## 2019-08-14 NOTE — Telephone Encounter (Signed)
-----   Message from Virginia Crews, MD sent at 08/14/2019 12:43 PM EST ----- Cholesterol is high  Is she taking Atorvastatin regularly?  Normal kidney function, liver function, electrolytes, Blood counts.  TSH is high, which means that Levothyroxine dose is too low.  Is she taking it regularly with alternating 75 and 31mcg dose every other day?

## 2019-08-17 NOTE — Telephone Encounter (Signed)
lmtcb

## 2019-08-17 NOTE — Telephone Encounter (Signed)
Okay.  Continue atorvastatin as ordered.  If she runs out before her next visit, please call us for refill.  We will plan to recheck it at her next visit to see that it is well controlled while she is on the medication.  Since her TSH is elevated, I recommend going to 88 mcg 5 times weekly and 75 mcg twice weekly and we will recheck her TSH at her next visit as well.

## 2019-08-17 NOTE — Telephone Encounter (Signed)
From [facility] 

## 2019-08-18 NOTE — Telephone Encounter (Signed)
Patient reports that she had surgery in 89 due to radiation exposure as a child. Patient reports she was exposed with radiation once a week for a full year at age 78. Patient reports that in 28 they have thyroid that was full of tumors and cyst. Patient is requesting to do an thyroid uptake study.

## 2019-08-18 NOTE — Telephone Encounter (Signed)
Lmtcb. [facility] may relay message.

## 2019-08-19 ENCOUNTER — Ambulatory Visit
Admission: RE | Admit: 2019-08-19 | Discharge: 2019-08-19 | Disposition: A | Discharge status: Home / Self Care | Payer: Medicare Other | Source: Ambulatory Visit | Attending: Surgery | Admitting: Surgery

## 2019-08-19 DIAGNOSIS — N6320 Unspecified lump in the left breast, unspecified quadrant: Secondary | ICD-10-CM

## 2019-08-19 DIAGNOSIS — R928 Other abnormal and inconclusive findings on diagnostic imaging of breast: Secondary | ICD-10-CM

## 2019-08-19 NOTE — Telephone Encounter (Signed)
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 A slightly elevated TSH is not a reason to get a thyroid uptake scan.

## 2019-08-19 NOTE — Telephone Encounter (Signed)
Patient advised as below.  

## 2019-08-20 ENCOUNTER — Other Ambulatory Visit: Payer: Self-pay | Admitting: Family Medicine

## 2019-09-01 ENCOUNTER — Ambulatory Visit: Payer: Medicare Other | Admitting: Surgery

## 2019-09-07 ENCOUNTER — Encounter: Payer: Self-pay | Admitting: Surgery

## 2019-09-07 ENCOUNTER — Other Ambulatory Visit: Payer: Self-pay

## 2019-09-07 ENCOUNTER — Ambulatory Visit (INDEPENDENT_AMBULATORY_CARE_PROVIDER_SITE_OTHER): Payer: Medicare Other | Admitting: Surgery

## 2019-09-07 ENCOUNTER — Ambulatory Visit: Payer: Medicare Other | Attending: Internal Medicine

## 2019-09-07 VITALS — BP 155/71 | HR 69 | Temp 97.5°F | Resp 14 | Ht 62.5 in | Wt 168.6 lb

## 2019-09-07 DIAGNOSIS — Z20822 Contact with and (suspected) exposure to covid-19: Secondary | ICD-10-CM

## 2019-09-07 DIAGNOSIS — N6002 Solitary cyst of left breast: Secondary | ICD-10-CM
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
## 2019-09-07 NOTE — Patient Instructions (Addendum)
Please continue to do self breast exams. Continue to get your yearly mammograms with your Primary Care Provider.   You can use Primrose oil to help with the breast tenderness.   Please call the office if you have any questions or concerns.   Dr. Hervey Ard Story [id number]   Breast Self-Awareness Breast self-awareness means being familiar with how your breasts look and feel. It involves checking your breasts regularly and reporting any changes to your health care provider. Practicing breast self-awareness is important. Sometimes changes may not be harmful (are benign), but sometimes a change in your breasts can be a sign of a serious medical problem. It is important to learn how to do this procedure correctly so that you can catch problems early, when treatment is more likely to be successful. All women should practice breast self-awareness, including women who have had breast implants. What you need:  A mirror.  A well-lit room. How to do a breast self-exam A breast self-exam is one way to learn what is normal for your breasts and whether your breasts are changing. To do a breast self-exam: Look for changes  1. Remove all the clothing above your waist. 2. Stand in front of a mirror in a room with good lighting. 3. Put your hands on your hips. 4. Push your hands firmly downward. 5. Compare your breasts in the mirror. Look for differences between them (asymmetry), such as: ? Differences in shape. ? Differences in size. ? Puckers, dips, and bumps in one breast and not the other. 6. Look at each breast for changes in the skin, such as: ? Redness. ? Scaly areas. 7. Look for changes in your nipples, such as: ? Discharge. ? Bleeding. ? Dimpling. ? Redness. ? A change in position. Feel for changes Carefully feel your breasts for lumps and changes. It is best to do this while lying on your back on the floor, and again while sitting or standing in the  tub or shower with soapy water on your skin. Feel each breast in the following way: 1. Place the arm on the side of the breast you are examining above your head. 2. Feel your breast with the other hand. 3. Start in the nipple area and make -inch (2 cm) overlapping circles to feel your breast. Use the pads of your three middle fingers to do this. Apply light pressure, then medium pressure, then firm pressure. The light pressure will allow you to feel the tissue closest to the skin. The medium pressure will allow you to feel the tissue that is a little deeper. The firm pressure will allow you to feel the tissue close to the ribs. 4. Continue the overlapping circles, moving downward over the breast until you feel your ribs below your breast. 5. Move one finger-width toward the center of the body. Continue to use the -inch (2 cm) overlapping circles to feel your breast as you move slowly up toward your collarbone. 6. Continue the up-and-down exam using all three pressures until you reach your armpit.  Write down what you find Writing down what you find can help you remember what to discuss with your health care provider. Write down:  What is normal for each breast.  Any changes that you find in each breast, including: ? The kind of changes you find. ? Any pain or tenderness. ? Size and location of any lumps.  Where you are in your menstrual cycle, if you are still menstruating. General  tips and recommendations  Examine your breasts every month.  If you are breastfeeding, the best time to examine your breasts is after a feeding or after using a breast pump.  If you menstruate, the best time to examine your breasts is 5-7 days after your period. Breasts are generally lumpier during menstrual periods, and it may be more difficult to notice changes.  With time and practice, you will become more familiar with the variations in your breasts and more comfortable with the exam. Contact a health  care provider if you:  See a change in the shape or size of your breasts or nipples.  See a change in the skin of your breast or nipples, such as a reddened or scaly area.  Have unusual discharge from your nipples.  Find a lump or thick area that was not there before.  Have pain in your breasts.  Have any concerns related to your breast health. Summary  Breast self-awareness includes looking for physical changes in your breasts, as well as feeling for any changes within your breasts.  Breast self-awareness should be performed in front of a mirror in a well-lit room.  You should examine your breasts every month. If you menstruate, the best time to examine your breasts is 5-7 days after your menstrual period.  Let your health care provider know of any changes you notice in your breasts, including changes in size, changes on the skin, pain or tenderness, or unusual fluid from your nipples. This information is not intended to replace advice given to you by your health care provider. Make sure you discuss any questions you have with your health care provider. Document Revised: 04/08/2018 Document Reviewed: 04/08/2018 Elsevier Patient Education  [place].

## 2019-09-09 LAB — NOVEL CORONAVIRUS, NAA: SARS-CoV-2, NAA: NOT DETECTED

## 2019-09-12 ENCOUNTER — Other Ambulatory Visit: Payer: Self-pay | Admitting: Family Medicine

## 2019-09-16 ENCOUNTER — Other Ambulatory Visit: Payer: Self-pay | Admitting: Family Medicine

## 2019-09-16 NOTE — Telephone Encounter (Signed)
Please review for refill of levalbuterol inhaler. Per pharmacy request, it looks like they are asking for 15 inhalers with 2 refills.

## 2019-09-24 ENCOUNTER — Telehealth: Payer: Self-pay | Admitting: Family Medicine

## 2019-09-24 NOTE — Telephone Encounter (Signed)
Patient is calling to request if Dr. B can request the script and the correct instruction levothyroxine (SYNTHROID) 75 MCG tablet [id number]  and levothyroxine (SYNTHROID) 88 MCG tablet [id number] Please advise [place], [place]

## 2019-09-25 MED ORDER — LEVOTHYROXINE SODIUM 75 MCG PO TABS: ORAL_TABLET | ORAL | 3 refills | Status: DC

## 2019-09-25 MED ORDER — LEVOTHYROXINE SODIUM 88 MCG PO TABS: 88.0000 ug | ORAL_TABLET | ORAL | 3 refills | Status: DC

## 2019-09-25 NOTE — Telephone Encounter (Signed)
Rxs sent

## 2019-10-12 NOTE — Telephone Encounter (Signed)
Pt called and stated that someone called her with her lab work and changed her thyroid meds.  She would like the new dose sent to the pharmacy as soon has possible.  She stated that they have to old dose Pharmacy - [facility] [address] [place]

## 2019-10-13 ENCOUNTER — Other Ambulatory Visit: Payer: Self-pay | Admitting: Family Medicine

## 2019-10-13 NOTE — Telephone Encounter (Signed)
Requested medication (s) are due for refill today: yes  Requested medication (s) are on the active medication list: yes  Last refill:  09/16/2019  Future visit scheduled: yes  Notes to clinic: note says for patient to follow up Review for refill   Requested Prescriptions  Pending Prescriptions Disp Refills   clopidogrel (PLAVIX) 75 MG tablet [Pharmacy Med Name: CLOPIDOGREL 75 MG TABLET] 30 tablet 0    Sig: TAKE 1 TABLET (75 MG TOTAL) BY MOUTH DAILY. PLEASE SCHEDULE OFFICE VISIT BEFORE ANY FUTURE REFILLS      Hematology: Antiplatelets - clopidogrel Failed - 10/13/2019  1:27 AM      Failed - Evaluate AST, ALT within 2 months of therapy initiation.      Passed - ALT in normal range and within 360 days    ALT  Date Value Ref Range Status  08/13/2019 10 0 - 32 IU/L Final   SGPT (ALT)  Date Value Ref Range Status  12/11/2012 27 12 - 78 U/L Final          Passed - AST in normal range and within 360 days    AST  Date Value Ref Range Status  08/13/2019 19 0 - 40 IU/L Final   SGOT(AST)  Date Value Ref Range Status  12/11/2012 21 15 - 37 Unit/L Final          Passed - HCT in normal range and within 180 days    Hematocrit  Date Value Ref Range Status  08/13/2019 36.9 34.0 - 46.6 % Final          Passed - HGB in normal range and within 180 days    Hemoglobin  Date Value Ref Range Status  08/13/2019 13.0 11.1 - 15.9 g/dL Final          Passed - PLT in normal range and within 180 days    Platelets  Date Value Ref Range Status  08/13/2019 248 150 - 450 x10E3/uL Final          Passed - Valid encounter within last 6 months    Recent Outpatient Visits           2 months ago Acquired hypothyroidism   [facility] [place], Dionne Bucy, MD   9 months ago [place], Dionne Bucy, MD       Future Appointments             In 2 months Bacigalupo, Dionne Bucy, MD [facility], [place]

## 2019-10-14 MED ORDER — LEVOTHYROXINE SODIUM 88 MCG PO TABS: ORAL_TABLET | ORAL | 3 refills | Status: DC

## 2019-10-14 MED ORDER — LEVOTHYROXINE SODIUM 75 MCG PO TABS: ORAL_TABLET | ORAL | 3 refills | Status: DC

## 2019-10-14 NOTE — Telephone Encounter (Signed)
Please review and make sure I ordered this right.  See lab results 08/2019.  It looks like you changed her levothyroxine 88 to 5 days a week and levothyroxine 75 2 days a week.   Thanks,   -Mickel Baas

## 2019-10-14 NOTE — Telephone Encounter (Signed)
Left message advising pt.   Thanks,   -Marbeth Smedley  

## 2019-11-11 ENCOUNTER — Other Ambulatory Visit: Payer: Self-pay | Admitting: Family Medicine

## 2019-11-11 NOTE — Telephone Encounter (Signed)
Requested Prescriptions  Pending Prescriptions Disp Refills  . gabapentin (NEURONTIN) 600 MG tablet [Pharmacy Med Name: GABAPENTIN 600 MG TABLET] 360 tablet 2    Sig: TAKE 2 TABLETS (1,200 MG TOTAL) BY MOUTH 2 (TWO) TIMES DAILY.     Neurology: Anticonvulsants - gabapentin Passed - 11/11/2019  1:31 AM      Passed - Valid encounter within last 12 months    Recent Outpatient Visits          3 months ago Acquired hypothyroidism   [facility] [place], Dionne Bucy, MD   10 months ago [place], Dionne Bucy, MD      Future Appointments            In 1 month Bacigalupo, Dionne Bucy, MD [facility] [place], [place]

## 2019-12-22 ENCOUNTER — Telehealth: Payer: Self-pay | Admitting: Neurology

## 2019-12-22 NOTE — Telephone Encounter (Signed)
Patient called and said she was told to call by her daughter because of recent changes with her health over the last two weeks. She said, "I seem to be staggering or stuttering when I walk. I don't have any sense of balance and I've been dizzy a lot. I've also left the car running several times and forgotten about it."   Patient has an appointment on 02/04/20 at 1:30 PM and has been added to the wait list.  Patient wants a call back from clinical staff with recommendation.

## 2019-12-22 NOTE — Telephone Encounter (Signed)
Hinton Dyer, would you work patient into a new-patient slot?  Thank you

## 2019-12-22 NOTE — Telephone Encounter (Signed)
Patient is sch to see Dr Tomi Likens on 12-25-19 @12 :37
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
## 2019-12-25 ENCOUNTER — Encounter: Payer: Self-pay | Admitting: Neurology

## 2019-12-25 ENCOUNTER — Ambulatory Visit (INDEPENDENT_AMBULATORY_CARE_PROVIDER_SITE_OTHER): Payer: Medicare Other | Admitting: Neurology

## 2019-12-25 VITALS — BP 131/78 | HR 70 | Ht 62.5 in | Wt 166.0 lb

## 2019-12-25 DIAGNOSIS — R4182 Altered mental status, unspecified: Secondary | ICD-10-CM | POA: Diagnosis not present

## 2019-12-25 DIAGNOSIS — R29818 Other symptoms and signs involving the nervous system: Secondary | ICD-10-CM

## 2019-12-25 NOTE — Patient Instructions (Addendum)
We will order an urgent MRI of brain with and without contrast.   If your MRI comes back normal we will cancel your appointment in June otherwise follow up in 6 months.

## 2019-12-28 ENCOUNTER — Telehealth: Payer: Self-pay

## 2019-12-28 ENCOUNTER — Ambulatory Visit (HOSPITAL_COMMUNITY)
Admission: RE | Admit: 2019-12-28 | Discharge: 2019-12-28 | Disposition: A | Discharge status: Home / Self Care | Payer: Medicare Other | Source: Ambulatory Visit | Attending: Neurology | Admitting: Neurology

## 2019-12-28 ENCOUNTER — Other Ambulatory Visit: Payer: Self-pay

## 2019-12-28 DIAGNOSIS — R4182 Altered mental status, unspecified: Secondary | ICD-10-CM | POA: Diagnosis present

## 2019-12-28 DIAGNOSIS — R29818 Other symptoms and signs involving the nervous system: Secondary | ICD-10-CM | POA: Diagnosis present

## 2019-12-28 MED ORDER — GADOBUTROL 1 MMOL/ML IV SOLN
7.5000 mL | Freq: Once | INTRAVENOUS | Status: AC | PRN
  Administered 2019-12-28: 7.5 mL via INTRAVENOUS

## 2019-12-28 NOTE — Telephone Encounter (Signed)
-----   Message from Sheffield Slider sent at 12/25/2019  2:03 PM EDT ----- Regarding: RE: stat MRI [id number] calid until 06/21/2020 ----- Message ----- From: Harold Hedge, CMA Sent: 12/25/2019   1:30 PM EDT To: Sheffield Slider Subject: stat MRI                                       Hey,   Dr Tomi Likens has ordered a stat MRI of the Brain w w/o contrast he is hoping this test can be done this weekend. I placed the order at [facility].   Thanks, Darden Dates

## 2019-12-28 NOTE — Telephone Encounter (Signed)
Called [facility] radiology dept today patient schedules for STAT MRI of the Brain w w/o Contrast for today at 10:30am.   Patient notified and voiced understanding.
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
## 2020-01-07 ENCOUNTER — Other Ambulatory Visit: Payer: Self-pay

## 2020-01-07 ENCOUNTER — Ambulatory Visit (INDEPENDENT_AMBULATORY_CARE_PROVIDER_SITE_OTHER): Payer: Medicare Other | Admitting: Family Medicine

## 2020-01-07 ENCOUNTER — Encounter: Payer: Self-pay | Admitting: Family Medicine

## 2020-01-07 ENCOUNTER — Ambulatory Visit (INDEPENDENT_AMBULATORY_CARE_PROVIDER_SITE_OTHER): Payer: Medicare Other

## 2020-01-07 VITALS — BP 98/58 | HR 68 | Temp 97.9°F | Ht 63.0 in | Wt 168.4 lb

## 2020-01-07 DIAGNOSIS — F411 Generalized anxiety disorder: Secondary | ICD-10-CM

## 2020-01-07 DIAGNOSIS — E2839 Other primary ovarian failure: Secondary | ICD-10-CM

## 2020-01-07 DIAGNOSIS — E663 Overweight: Secondary | ICD-10-CM

## 2020-01-07 DIAGNOSIS — E039 Hypothyroidism, unspecified: Secondary | ICD-10-CM | POA: Diagnosis not present

## 2020-01-07 DIAGNOSIS — J4530 Mild persistent asthma, uncomplicated: Secondary | ICD-10-CM

## 2020-01-07 DIAGNOSIS — M797 Fibromyalgia: Secondary | ICD-10-CM

## 2020-01-07 DIAGNOSIS — E669 Obesity, unspecified: Secondary | ICD-10-CM

## 2020-01-07 DIAGNOSIS — F410 Panic disorder [episodic paroxysmal anxiety] without agoraphobia: Secondary | ICD-10-CM

## 2020-01-07 DIAGNOSIS — E7800 Pure hypercholesterolemia, unspecified: Secondary | ICD-10-CM | POA: Diagnosis not present

## 2020-01-07 DIAGNOSIS — Z0000 Encounter for general adult medical examination without abnormal findings: Secondary | ICD-10-CM | POA: Diagnosis not present

## 2020-01-07 HISTORY — DX: Obesity, unspecified: E66.9

## 2020-01-07 NOTE — Assessment & Plan Note (Signed)
Chronic and well controlled.  Continue Duloxetine, Buspirone

## 2020-01-07 NOTE — Assessment & Plan Note (Signed)
TSH uncontrolled at last checked.  Currently taking 3mcg Monday-Friday and 71mcg Saturday and Sunday Will recheck TSH

## 2020-01-07 NOTE — Assessment & Plan Note (Signed)
Previously Uncontrolled, but was out of Atorvastatin. Pt reports good compliance now. Continue statin Repeat FLP and CMP Goal LDL < 70

## 2020-01-07 NOTE — Patient Instructions (Signed)
[name] , Thank you for taking time to come for your Medicare Wellness Visit. I appreciate your ongoing commitment to your health goals. Please review the following plan we discussed and let me know if I can assist you in the future.   Screening recommendations/referrals: Colonoscopy: No longer required.  Mammogram: No longer required.  Bone Density: Ordered today. Pt aware the office will contact her to set up apt.  Recommended yearly ophthalmology/optometry visit for glaucoma screening and checkup Recommended yearly dental visit for hygiene and checkup  Vaccinations: Influenza vaccine: Up to date Pneumococcal vaccine: Completed series Tdap vaccine: Up to date Shingles vaccine: Second dose due    Advanced directives: Please bring a copy of your POA (Power of Attorney) and/or Living Will to your next appointment.   Conditions/risks identified: Fall risk prevention discussed today.  Next appointment: 2:40 PM today with Dr Brita Romp    Preventive Care 77 Years and Older, Female Preventive care refers to lifestyle choices and visits with your health care provider that can promote health and wellness. What does preventive care include?  A yearly physical exam. This is also called an annual well check.  Dental exams once or twice a year.  Routine eye exams. Ask your health care provider how often you should have your eyes checked.  Personal lifestyle choices, including:  Daily care of your teeth and gums.  Regular physical activity.  Eating a healthy diet.  Avoiding tobacco and drug use.  Limiting alcohol use.  Practicing safe sex.  Taking low-dose aspirin every day.  Taking vitamin and mineral supplements as recommended by your health care provider. What happens during an annual well check? The services and screenings done by your health care provider during your annual well check will depend on your age, overall health, lifestyle risk factors, and family history  of disease. Counseling  Your health care provider may ask you questions about your:  Alcohol use.  Tobacco use.  Drug use.  Emotional well-being.  Home and relationship well-being.  Sexual activity.  Eating habits.  History of falls.  Memory and ability to understand (cognition).  Work and work Statistician.  Reproductive health. Screening  You may have the following tests or measurements:  Height, weight, and BMI.  Blood pressure.  Lipid and cholesterol levels. These may be checked every 5 years, or more frequently if you are over 68 years old.  Skin check.  Lung cancer screening. You may have this screening every year starting at age 71 if you have a 30-pack-year history of smoking and currently smoke or have quit within the past 15 years.  Fecal occult blood test (FOBT) of the stool. You may have this test every year starting at age 44.  Flexible sigmoidoscopy or colonoscopy. You may have a sigmoidoscopy every 5 years or a colonoscopy every 10 years starting at age 81.  Hepatitis C blood test.  Hepatitis B blood test.  Sexually transmitted disease (STD) testing.  Diabetes screening. This is done by checking your blood sugar (glucose) after you have not eaten for a while (fasting). You may have this done every 1-3 years.  Bone density scan. This is done to screen for osteoporosis. You may have this done starting at age 100.  Mammogram. This may be done every 1-2 years. Talk to your health care provider about how often you should have regular mammograms. Talk with your health care provider about your test results, treatment options, and if necessary, the need for more tests. Vaccines  Your health care provider may recommend certain vaccines, such as:  Influenza vaccine. This is recommended every year.  Tetanus, diphtheria, and acellular pertussis (Tdap, Td) vaccine. You may need a Td booster every 10 years.  Zoster vaccine. You may need this after age  74.  Pneumococcal 13-valent conjugate (PCV13) vaccine. One dose is recommended after age 19.  Pneumococcal polysaccharide (PPSV23) vaccine. One dose is recommended after age 2. Talk to your health care provider about which screenings and vaccines you need and how often you need them. This information is not intended to replace advice given to you by your health care provider. Make sure you discuss any questions you have with your health care provider. Document Released: 09/16/2015 Document Revised: 05/09/2016 Document Reviewed: 06/21/2015 Elsevier Interactive Patient Education  2017 [place] Prevention in the Home Falls can cause injuries. They can happen to people of all ages. There are many things you can do to make your home safe and to help prevent falls. What can I do on the outside of my home?  Regularly fix the edges of walkways and driveways and fix any cracks.  Remove anything that might make you trip as you walk through a door, such as a raised step or threshold.  Trim any bushes or trees on the path to your home.  Use bright outdoor lighting.  Clear any walking paths of anything that might make someone trip, such as rocks or tools.  Regularly check to see if handrails are loose or broken. Make sure that both sides of any steps have handrails.  Any raised decks and porches should have guardrails on the edges.  Have any leaves, snow, or ice cleared regularly.  Use sand or salt on walking paths during winter.  Clean up any spills in your garage right away. This includes oil or grease spills. What can I do in the bathroom?  Use night lights.  Install grab bars by the toilet and in the tub and shower. Do not use towel bars as grab bars.  Use non-skid mats or decals in the tub or shower.  If you need to sit down in the shower, use a plastic, non-slip stool.  Keep the floor dry. Clean up any water that spills on the floor as soon as it happens.  Remove  soap buildup in the tub or shower regularly.  Attach bath mats securely with double-sided non-slip rug tape.  Do not have throw rugs and other things on the floor that can make you trip. What can I do in the bedroom?  Use night lights.  Make sure that you have a light by your bed that is easy to reach.  Do not use any sheets or blankets that are too big for your bed. They should not hang down onto the floor.  Have a firm chair that has side arms. You can use this for support while you get dressed.  Do not have throw rugs and other things on the floor that can make you trip. What can I do in the kitchen?  Clean up any spills right away.  Avoid walking on wet floors.  Keep items that you use a lot in easy-to-reach places.  If you need to reach something above you, use a strong step stool that has a grab bar.  Keep electrical cords out of the way.  Do not use floor polish or wax that makes floors slippery. If you must use wax, use non-skid floor wax.  Do  not have throw rugs and other things on the floor that can make you trip. What can I do with my stairs?  Do not leave any items on the stairs.  Make sure that there are handrails on both sides of the stairs and use them. Fix handrails that are broken or loose. Make sure that handrails are as long as the stairways.  Check any carpeting to make sure that it is firmly attached to the stairs. Fix any carpet that is loose or worn.  Avoid having throw rugs at the top or bottom of the stairs. If you do have throw rugs, attach them to the floor with carpet tape.  Make sure that you have a light switch at the top of the stairs and the bottom of the stairs. If you do not have them, ask someone to add them for you. What else can I do to help prevent falls?  Wear shoes that:  Do not have high heels.  Have rubber bottoms.  Are comfortable and fit you well.  Are closed at the toe. Do not wear sandals.  If you use a  stepladder:  Make sure that it is fully opened. Do not climb a closed stepladder.  Make sure that both sides of the stepladder are locked into place.  Ask someone to hold it for you, if possible.  Clearly mark and make sure that you can see:  Any grab bars or handrails.  First and last steps.  Where the edge of each step is.  Use tools that help you move around (mobility aids) if they are needed. These include:  Canes.  Walkers.  Scooters.  Crutches.  Turn on the lights when you go into a dark area. Replace any light bulbs as soon as they burn out.  Set up your furniture so you have a clear path. Avoid moving your furniture around.  If any of your floors are uneven, fix them.  If there are any pets around you, be aware of where they are.  Review your medicines with your doctor. Some medicines can make you feel dizzy. This can increase your chance of falling. Ask your doctor what other things that you can do to help prevent falls. This information is not intended to replace advice given to you by your health care provider. Make sure you discuss any questions you have with your health care provider. Document Released: 06/16/2009 Document Revised: 01/26/2016 Document Reviewed: 09/24/2014 Elsevier Interactive Patient Education  2017 [employer].

## 2020-01-07 NOTE — Assessment & Plan Note (Signed)
Chronic and Stable Continue Symbicort daily and Xopenex as needed.

## 2020-01-07 NOTE — Patient Instructions (Signed)
Hypothyroidism  Hypothyroidism is when the thyroid gland does not make enough of certain hormones (it is underactive). The thyroid gland is a small gland located in the lower front part of the neck, just in front of the windpipe (trachea). This gland makes hormones that help control how the body uses food for energy (metabolism) as well as how the heart and brain function. These hormones also play a role in keeping your bones strong. When the thyroid is underactive, it produces too little of the hormones thyroxine (T4) and triiodothyronine (T3). What are the causes? This condition may be caused by:  Hashimoto's disease. This is a disease in which the body's disease-fighting system (immune system) attacks the thyroid gland. This is the most common cause.  Viral infections.  Pregnancy.  Certain medicines.  Birth defects.  Past radiation treatments to the head or neck for cancer.  Past treatment with radioactive iodine.  Past exposure to radiation in the environment.  Past surgical removal of part or all of the thyroid.  Problems with a gland in the center of the brain (pituitary gland).  Lack of enough iodine in the diet. What increases the risk? You are more likely to develop this condition if:  You are female.  You have a family history of thyroid conditions.  You use a medicine called lithium.  You take medicines that affect the immune system (immunosuppressants). What are the signs or symptoms? Symptoms of this condition include:  Feeling as though you have no energy (lethargy).  Not being able to tolerate cold.  Weight gain that is not explained by a change in diet or exercise habits.  Lack of appetite.  Dry skin.  Coarse hair.  Menstrual irregularity.  Slowing of thought processes.  Constipation.  Sadness or depression. How is this diagnosed? This condition may be diagnosed based on:  Your symptoms, your medical history, and a physical exam.  Blood  tests. You may also have imaging tests, such as an ultrasound or MRI. How is this treated? This condition is treated with medicine that replaces the thyroid hormones that your body does not make. After you begin treatment, it may take several weeks for symptoms to go away. Follow these instructions at home:  Take over-the-counter and prescription medicines only as told by your health care provider.  If you start taking any new medicines, tell your health care provider.  Keep all follow-up visits as told by your health care provider. This is important. ? As your condition improves, your dosage of thyroid hormone medicine may change. ? You will need to have blood tests regularly so that your health care provider can monitor your condition. Contact a health care provider if:  Your symptoms do not get better with treatment.  You are taking thyroid replacement medicine and you: ? Sweat a lot. ? Have tremors. ? Feel anxious. ? Lose weight rapidly. ? Cannot tolerate heat. ? Have emotional swings. ? Have diarrhea. ? Feel weak. Get help right away if you have:  Chest pain.  An irregular heartbeat.  A rapid heartbeat.  Difficulty breathing. Summary  Hypothyroidism is when the thyroid gland does not make enough of certain hormones (it is underactive).  When the thyroid is underactive, it produces too little of the hormones thyroxine (T4) and triiodothyronine (T3).  The most common cause is Hashimoto's disease, a disease in which the body's disease-fighting system (immune system) attacks the thyroid gland. The condition can also be caused by viral infections, medicine, pregnancy, or past   radiation treatment to the head or neck.  Symptoms may include weight gain, dry skin, constipation, feeling as though you do not have energy, and not being able to tolerate cold.  This condition is treated with medicine to replace the thyroid hormones that your body does not make. This information  is not intended to replace advice given to you by your health care provider. Make sure you discuss any questions you have with your health care provider. Document Revised: 08/02/2017 Document Reviewed: 07/31/2017 Elsevier Patient Education  2020 [employer]  
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
## 2020-01-07 NOTE — Assessment & Plan Note (Signed)
Discussed importance of healthy weight management Discussed diet and exercise  

## 2020-01-07 NOTE — Assessment & Plan Note (Signed)
Chronic and well controlled Encourage exercise Continue gabapentin and cymbalta

## 2020-01-15 ENCOUNTER — Other Ambulatory Visit: Payer: Self-pay | Admitting: Family Medicine

## 2020-01-15 NOTE — Telephone Encounter (Signed)
Requested Prescriptions  Pending Prescriptions Disp Refills  . clopidogrel (PLAVIX) 75 MG tablet [Pharmacy Med Name: CLOPIDOGREL 75 MG TABLET] 90 tablet 0    Sig: TAKE 1 TABLET (75 MG TOTAL) BY MOUTH DAILY. PLEASE SCHEDULE OFFICE VISIT BEFORE ANY FUTURE REFILLS     Hematology: Antiplatelets - clopidogrel Failed - 01/15/2020  1:18 AM      Failed - Evaluate AST, ALT within 2 months of therapy initiation.      Passed - ALT in normal range and within 360 days    ALT  Date Value Ref Range Status  08/13/2019 10 0 - 32 IU/L Final   SGPT (ALT)  Date Value Ref Range Status  12/11/2012 27 12 - 78 U/L Final         Passed - AST in normal range and within 360 days    AST  Date Value Ref Range Status  08/13/2019 19 0 - 40 IU/L Final   SGOT(AST)  Date Value Ref Range Status  12/11/2012 21 15 - 37 Unit/L Final         Passed - HCT in normal range and within 180 days    Hematocrit  Date Value Ref Range Status  08/13/2019 36.9 34.0 - 46.6 % Final         Passed - HGB in normal range and within 180 days    Hemoglobin  Date Value Ref Range Status  08/13/2019 13.0 11.1 - 15.9 g/dL Final         Passed - PLT in normal range and within 180 days    Platelets  Date Value Ref Range Status  08/13/2019 248 150 - 450 x10E3/uL Final         Passed - Valid encounter within last 6 months    Recent Outpatient Visits          1 week ago Acquired hypothyroidism   [facility] [place], Dionne Bucy, MD   5 months ago Acquired hypothyroidism   [facility] [place], Dionne Bucy, MD   1 year ago [place], Dionne Bucy, MD      Future Appointments            In 6 months Bacigalupo, Dionne Bucy, MD [facility], [place]   In 12 months Bacigalupo, Dionne Bucy, MD [facility], [place]

## 2020-01-29 ENCOUNTER — Telehealth: Payer: Self-pay

## 2020-01-29 LAB — COMPREHENSIVE METABOLIC PANEL
ALT: 13 IU/L (ref 0–32)
AST: 19 IU/L (ref 0–40)
Albumin/Globulin Ratio: 2 (ref 1.2–2.2)
Albumin: 4.4 g/dL (ref 3.7–4.7)
Alkaline Phosphatase: 125 IU/L — ABNORMAL HIGH (ref 48–121)
BUN/Creatinine Ratio: 15 (ref 12–28)
BUN: 11 mg/dL (ref 8–27)
Bilirubin Total: 0.3 mg/dL (ref 0.0–1.2)
CO2: 20 mmol/L (ref 20–29)
Calcium: 9.2 mg/dL (ref 8.7–10.3)
Chloride: 104 mmol/L (ref 96–106)
Creatinine, Ser: 0.74 mg/dL (ref 0.57–1.00)
GFR calc Af Amer: 91 mL/min/{1.73_m2} (ref >59)
GFR calc non Af Amer: 79 mL/min/{1.73_m2} (ref >59)
Globulin, Total: 2.2 g/dL (ref 1.5–4.5)
Glucose: 160 mg/dL — ABNORMAL HIGH (ref 65–99)
Potassium: 4 mmol/L (ref 3.5–5.2)
Sodium: 138 mmol/L (ref 134–144)
Total Protein: 6.6 g/dL (ref 6.0–8.5)

## 2020-01-29 LAB — LIPID PANEL
Chol/HDL Ratio: 2.3 ratio (ref 0.0–4.4)
Cholesterol, Total: 145 mg/dL (ref 100–199)
HDL: 63 mg/dL (ref >39)
LDL Chol Calc (NIH): 72 mg/dL (ref 0–99)
Triglycerides: 46 mg/dL (ref 0–149)
VLDL Cholesterol Cal: 10 mg/dL (ref 5–40)

## 2020-01-29 LAB — TSH: TSH: 1.48 u[IU]/mL (ref 0.450–4.500)

## 2020-01-29 NOTE — Telephone Encounter (Signed)
Patient called, left VM to return the call to the office. 

## 2020-01-29 NOTE — Telephone Encounter (Signed)
LMTCB, PEC may give result.  

## 2020-01-29 NOTE — Telephone Encounter (Signed)
-----   Message from Virginia Crews, MD sent at 01/29/2020  8:30 AM EDT ----- Normal labs

## 2020-02-02 ENCOUNTER — Telehealth: Payer: Self-pay

## 2020-02-02 NOTE — Telephone Encounter (Signed)
This encounter was created in error - please disregard.

## 2020-02-02 NOTE — Addendum Note (Signed)
Addended by: Corky Sox E on: 02/02/2020 02:45 PM   Modules accepted: Level of Service, SmartSet

## 2020-02-02 NOTE — Telephone Encounter (Signed)
Pt given lab results per notes of Dr. Brita Romp on 01/29/20. Pt verbalized understanding.

## 2020-02-02 NOTE — Telephone Encounter (Signed)
Attempted to reach pt, busy signal

## 2020-02-02 NOTE — Telephone Encounter (Signed)
Informed pt her labwork was normal per note of Dr Brita Romp on 01/29/20. Pt verbalized understanding.

## 2020-02-02 NOTE — Telephone Encounter (Signed)
Pt stated she was wanting to get a  Bone scan. She stated it was discussed at her last OV 01/07/20. Informed pt that I will route to office. Pt verbalized understanding.
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
## 2020-02-03 NOTE — Telephone Encounter (Signed)
Please review

## 2020-02-04 ENCOUNTER — Ambulatory Visit: Payer: Medicare Other | Admitting: Neurology

## 2020-02-07 ENCOUNTER — Emergency Department
Admission: EM | Admit: 2020-02-07 | Discharge: 2020-02-07 | Disposition: A | Discharge status: Home / Self Care | Payer: Medicare Other | Attending: Emergency Medicine | Admitting: Emergency Medicine

## 2020-02-07 ENCOUNTER — Encounter: Payer: Self-pay | Admitting: Emergency Medicine

## 2020-02-07 ENCOUNTER — Other Ambulatory Visit: Payer: Self-pay

## 2020-02-07 DIAGNOSIS — S61213A Laceration without foreign body of left middle finger without damage to nail, initial encounter: Secondary | ICD-10-CM | POA: Insufficient documentation

## 2020-02-07 DIAGNOSIS — Z87891 Personal history of nicotine dependence: Secondary | ICD-10-CM | POA: Diagnosis not present

## 2020-02-07 DIAGNOSIS — Z7902 Long term (current) use of antithrombotics/antiplatelets: Secondary | ICD-10-CM | POA: Diagnosis not present

## 2020-02-07 DIAGNOSIS — J45909 Unspecified asthma, uncomplicated: Secondary | ICD-10-CM | POA: Diagnosis not present

## 2020-02-07 DIAGNOSIS — W260XXA Contact with knife, initial encounter: Secondary | ICD-10-CM | POA: Insufficient documentation

## 2020-02-07 DIAGNOSIS — Z79899 Other long term (current) drug therapy: Secondary | ICD-10-CM | POA: Diagnosis not present

## 2020-02-07 DIAGNOSIS — E039 Hypothyroidism, unspecified: Secondary | ICD-10-CM | POA: Insufficient documentation

## 2020-02-07 DIAGNOSIS — Y93G1 Activity, food preparation and clean up: Secondary | ICD-10-CM | POA: Diagnosis not present

## 2020-02-07 DIAGNOSIS — Y929 Unspecified place or not applicable: Secondary | ICD-10-CM | POA: Insufficient documentation

## 2020-02-07 DIAGNOSIS — Z85828 Personal history of other malignant neoplasm of skin: Secondary | ICD-10-CM | POA: Insufficient documentation

## 2020-02-07 DIAGNOSIS — Y999 Unspecified external cause status: Secondary | ICD-10-CM | POA: Insufficient documentation

## 2020-02-07 MED ORDER — LIDOCAINE-EPINEPHRINE 2 %-1:100000 IJ SOLN
20.0000 mL | Freq: Once | INTRAMUSCULAR | Status: AC
  Administered 2020-02-07: 20 mL via INTRADERMAL
  Filled 2020-02-07: qty 1

## 2020-02-07 NOTE — ED Notes (Signed)
Pt reports cut let index finger on knife  TSAP less than 5 years  Bleeding controlled ans well approximated lac 1 inch in length
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
## 2020-02-07 NOTE — ED Notes (Signed)
No peripheral IV placed this visit.   Discharge instructions reviewed with patient. Questions fielded by this RN. Patient verbalizes understanding of instructions. Patient discharged home in stable condition per stafford . No acute distress noted at time of discharge.

## 2020-02-07 NOTE — ED Triage Notes (Signed)
Pt arrives POV to triage with c/o left middle finger laceration. Pt states that she was cutting a bagel at midnight and the knife was sharper than she realized. Pt is in NAD.

## 2020-03-03 ENCOUNTER — Ambulatory Visit
Admission: RE | Admit: 2020-03-03 | Discharge: 2020-03-03 | Disposition: A | Discharge status: Home / Self Care | Payer: Medicare Other | Source: Ambulatory Visit | Attending: Family Medicine | Admitting: Family Medicine

## 2020-03-03 DIAGNOSIS — E2839 Other primary ovarian failure: Secondary | ICD-10-CM | POA: Diagnosis present

## 2020-03-17 ENCOUNTER — Other Ambulatory Visit: Payer: Self-pay | Admitting: Family Medicine

## 2020-03-21 ENCOUNTER — Telehealth: Payer: Self-pay

## 2020-03-21 DIAGNOSIS — E2839 Other primary ovarian failure: Secondary | ICD-10-CM

## 2020-03-21 MED ORDER — ALENDRONATE SODIUM 70 MG PO TABS: 70.0000 mg | ORAL_TABLET | ORAL | 11 refills | Status: DC

## 2020-03-21 NOTE — Telephone Encounter (Signed)
Patient advised as below. Patient reports she will look into side effects before starting medication. Patient reports she will be having dental work in the next week. I did advise patient to talk to Dentist also.

## 2020-03-21 NOTE — Telephone Encounter (Signed)
LVMTCB for patient and sent prescription to patient's pharmacy.

## 2020-03-21 NOTE — Telephone Encounter (Signed)
-----   Message from Jerrol Banana., MD sent at 03/21/2020  1:59 PM EDT ----- Patient osteoporotic.  Will consider adding alendronate 70 mg once weekly.  Follow-up with Dr. B in the fall.

## 2020-03-21 NOTE — Telephone Encounter (Signed)
Patient would like the nurse to call because she has some questions about the medication that was sent to the pharmacy.  CB# [phone]

## 2020-03-21 NOTE — Telephone Encounter (Signed)
Called to advise patient as below, no answer. LVMTCB. Prescription sent to pharmacy for patient.

## 2020-03-21 NOTE — Telephone Encounter (Signed)
Returned patient's call, no answer. LVMTCB.

## 2020-03-21 NOTE — Telephone Encounter (Signed)
Patient advised.

## 2020-03-21 NOTE — Telephone Encounter (Signed)
Please review

## 2020-03-22 NOTE — Telephone Encounter (Signed)
Would not start the new medicine until a month after the dental work is completed

## 2020-03-22 NOTE — Telephone Encounter (Signed)
Called to advise patient that Dr. Rosanna Randy said for her to wait until one month after her dental work has been completed to start medication Alendronate. No answer, LVM.

## 2020-04-05 ENCOUNTER — Other Ambulatory Visit: Payer: Self-pay | Admitting: Family Medicine

## 2020-04-12 ENCOUNTER — Telehealth: Payer: Self-pay | Admitting: Family Medicine

## 2020-04-12 NOTE — Telephone Encounter (Signed)
Medication Refill - Medication: Thyroid medicine, 5 days of 88 and 2 days of 75   Has the patient contacted their pharmacy? Yes.   Advised to contact PCP (Agent: If no, request that the patient contact the pharmacy for the refill.) (Agent: If yes, when and what did the pharmacy advise?)  Preferred Pharmacy (with phone number or street name): [facility] [address]  Agent: Please be advised that RX refills may take up to 3 business days. We ask that you follow-up with your pharmacy.

## 2020-04-13 ENCOUNTER — Telehealth: Payer: Self-pay

## 2020-04-13 NOTE — Telephone Encounter (Signed)
Copied from [place] [id number]. Topic: General - Call Back - No Documentation >> Apr 12, 2020  4:08 PM Jaynie Collins D wrote: Reason for CRM: Patient states she is returning phone calls that she has been missing, in regards to her osteoporosis. Please call patient back with resolution.

## 2020-04-14 ENCOUNTER — Other Ambulatory Visit: Payer: Self-pay

## 2020-04-14 ENCOUNTER — Other Ambulatory Visit: Payer: Self-pay | Admitting: Family Medicine

## 2020-04-14 MED ORDER — LEVOTHYROXINE SODIUM 88 MCG PO TABS: ORAL_TABLET | ORAL | 3 refills | Status: DC

## 2020-04-14 MED ORDER — LEVOTHYROXINE SODIUM 75 MCG PO TABS: ORAL_TABLET | ORAL | 3 refills | Status: DC

## 2020-04-14 NOTE — Telephone Encounter (Signed)
Patient advised to start fosamax one month after her dental procedures. Patient verbalizes understanding and is in agreement with treatment plan.

## 2020-04-28 ENCOUNTER — Telehealth: Payer: Self-pay | Admitting: Family Medicine

## 2020-05-03 ENCOUNTER — Other Ambulatory Visit: Payer: Self-pay

## 2020-05-03 ENCOUNTER — Encounter: Payer: Self-pay | Admitting: Family Medicine

## 2020-05-03 ENCOUNTER — Ambulatory Visit: Payer: Medicare Other | Admitting: Family Medicine

## 2020-05-03 ENCOUNTER — Ambulatory Visit (INDEPENDENT_AMBULATORY_CARE_PROVIDER_SITE_OTHER): Payer: Medicare Other | Admitting: Family Medicine

## 2020-05-03 VITALS — BP 146/80 | HR 71 | Temp 98.0°F | Wt 167.0 lb

## 2020-05-03 DIAGNOSIS — Z8673 Personal history of transient ischemic attack (TIA), and cerebral infarction without residual deficits: Secondary | ICD-10-CM

## 2020-05-03 DIAGNOSIS — E039 Hypothyroidism, unspecified: Secondary | ICD-10-CM

## 2020-05-03 DIAGNOSIS — M810 Age-related osteoporosis without current pathological fracture: Secondary | ICD-10-CM

## 2020-05-03 DIAGNOSIS — E7800 Pure hypercholesterolemia, unspecified: Secondary | ICD-10-CM | POA: Diagnosis not present

## 2020-05-03 DIAGNOSIS — R030 Elevated blood-pressure reading, without diagnosis of hypertension: Secondary | ICD-10-CM

## 2020-05-03 MED ORDER — CLOPIDOGREL BISULFATE 75 MG PO TABS: 75.0000 mg | ORAL_TABLET | Freq: Every day | ORAL | 1 refills | Status: DC

## 2020-05-03 NOTE — Patient Instructions (Signed)
DASH Eating Plan DASH stands for "Dietary Approaches to Stop Hypertension." The DASH eating plan is a healthy eating plan that has been shown to reduce high blood pressure (hypertension). It may also reduce your risk for type 2 diabetes, heart disease, and stroke. The DASH eating plan may also help with weight loss. What are tips for following this plan?  General guidelines  Avoid eating more than 2,300 mg (milligrams) of salt (sodium) a day. If you have hypertension, you may need to reduce your sodium intake to 1,500 mg a day.  Limit alcohol intake to no more than 1 drink a day for nonpregnant women and 2 drinks a day for men. One drink equals 12 oz of beer, 5 oz of wine, or 1 oz of hard liquor.  Work with your health care provider to maintain a healthy body weight or to lose weight. Ask what an ideal weight is for you.  Get at least 30 minutes of exercise that causes your heart to beat faster (aerobic exercise) most days of the week. Activities may include walking, swimming, or biking.  Work with your health care provider or diet and nutrition specialist (dietitian) to adjust your eating plan to your individual calorie needs. Reading food labels   Check food labels for the amount of sodium per serving. Choose foods with less than 5 percent of the Daily Value of sodium. Generally, foods with less than 300 mg of sodium per serving fit into this eating plan.  To find whole grains, look for the word "whole" as the first word in the ingredient list. Shopping  Buy products labeled as "low-sodium" or "no salt added."  Buy fresh foods. Avoid canned foods and premade or frozen meals. Cooking  Avoid adding salt when cooking. Use salt-free seasonings or herbs instead of table salt or sea salt. Check with your health care provider or pharmacist before using salt substitutes.  Do not fry foods. Cook foods using healthy methods such as baking, boiling, grilling, and broiling instead.  Cook with  heart-healthy oils, such as olive, canola, soybean, or sunflower oil. Meal planning  Eat a balanced diet that includes: ? 5 or more servings of fruits and vegetables each day. At each meal, try to fill half of your plate with fruits and vegetables. ? Up to 6-8 servings of whole grains each day. ? Less than 6 oz of lean meat, poultry, or fish each day. A 3-oz serving of meat is about the same size as a deck of cards. One egg equals 1 oz. ? 2 servings of low-fat dairy each day. ? A serving of nuts, seeds, or beans 5 times each week. ? Heart-healthy fats. Healthy fats called Omega-3 fatty acids are found in foods such as flaxseeds and coldwater fish, like sardines, salmon, and mackerel.  Limit how much you eat of the following: ? Canned or prepackaged foods. ? Food that is high in trans fat, such as fried foods. ? Food that is high in saturated fat, such as fatty meat. ? Sweets, desserts, sugary drinks, and other foods with added sugar. ? Full-fat dairy products.  Do not salt foods before eating.  Try to eat at least 2 vegetarian meals each week.  Eat more home-cooked food and less restaurant, buffet, and fast food.  When eating at a restaurant, ask that your food be prepared with less salt or no salt, if possible. What foods are recommended? The items listed may not be a complete list. Talk with your dietitian about   what dietary choices are best for you. Grains Whole-grain or whole-wheat bread. Whole-grain or whole-wheat pasta. Brown rice. Oatmeal. Quinoa. Bulgur. Whole-grain and low-sodium cereals. Pita bread. Low-fat, low-sodium crackers. Whole-wheat flour tortillas. Vegetables Fresh or frozen vegetables (raw, steamed, roasted, or grilled). Low-sodium or reduced-sodium tomato and vegetable juice. Low-sodium or reduced-sodium tomato sauce and tomato paste. Low-sodium or reduced-sodium canned vegetables. Fruits All fresh, dried, or frozen fruit. Canned fruit in natural juice (without  added sugar). Meat and other protein foods Skinless chicken or turkey. Ground chicken or turkey. Pork with fat trimmed off. Fish and seafood. Egg whites. Dried beans, peas, or lentils. Unsalted nuts, nut butters, and seeds. Unsalted canned beans. Lean cuts of beef with fat trimmed off. Low-sodium, lean deli meat. Dairy Low-fat (1%) or fat-free (skim) milk. Fat-free, low-fat, or reduced-fat cheeses. Nonfat, low-sodium ricotta or cottage cheese. Low-fat or nonfat yogurt. Low-fat, low-sodium cheese. Fats and oils Soft margarine without trans fats. Vegetable oil. Low-fat, reduced-fat, or light mayonnaise and salad dressings (reduced-sodium). Canola, safflower, olive, soybean, and sunflower oils. Avocado. Seasoning and other foods Herbs. Spices. Seasoning mixes without salt. Unsalted popcorn and pretzels. Fat-free sweets. What foods are not recommended? The items listed may not be a complete list. Talk with your dietitian about what dietary choices are best for you. Grains Baked goods made with fat, such as croissants, muffins, or some breads. Dry pasta or rice meal packs. Vegetables Creamed or fried vegetables. Vegetables in a cheese sauce. Regular canned vegetables (not low-sodium or reduced-sodium). Regular canned tomato sauce and paste (not low-sodium or reduced-sodium). Regular tomato and vegetable juice (not low-sodium or reduced-sodium). Pickles. Olives. Fruits Canned fruit in a light or heavy syrup. Fried fruit. Fruit in cream or butter sauce. Meat and other protein foods Fatty cuts of meat. Ribs. Fried meat. Bacon. Sausage. Bologna and other processed lunch meats. Salami. Fatback. Hotdogs. Bratwurst. Salted nuts and seeds. Canned beans with added salt. Canned or smoked fish. Whole eggs or egg yolks. Chicken or turkey with skin. Dairy Whole or 2% milk, cream, and half-and-half. Whole or full-fat cream cheese. Whole-fat or sweetened yogurt. Full-fat cheese. Nondairy creamers. Whipped toppings.  Processed cheese and cheese spreads. Fats and oils Butter. Stick margarine. Lard. Shortening. Ghee. Bacon fat. Tropical oils, such as coconut, palm kernel, or palm oil. Seasoning and other foods Salted popcorn and pretzels. Onion salt, garlic salt, seasoned salt, table salt, and sea salt. Worcestershire sauce. Tartar sauce. Barbecue sauce. Teriyaki sauce. Soy sauce, including reduced-sodium. Steak sauce. Canned and packaged gravies. Fish sauce. Oyster sauce. Cocktail sauce. Horseradish that you find on the shelf. Ketchup. Mustard. Meat flavorings and tenderizers. Bouillon cubes. Hot sauce and Tabasco sauce. Premade or packaged marinades. Premade or packaged taco seasonings. Relishes. Regular salad dressings. Where to find more information:  National Heart, Lung, and Blood Institute: www.nhlbi.nih.gov  American Heart Association: www.heart.org Summary  The DASH eating plan is a healthy eating plan that has been shown to reduce high blood pressure (hypertension). It may also reduce your risk for type 2 diabetes, heart disease, and stroke.  With the DASH eating plan, you should limit salt (sodium) intake to 2,300 mg a day. If you have hypertension, you may need to reduce your sodium intake to 1,500 mg a day.  When on the DASH eating plan, aim to eat more fresh fruits and vegetables, whole grains, lean proteins, low-fat dairy, and heart-healthy fats.  Work with your health care provider or diet and nutrition specialist (dietitian) to adjust your eating plan to your   individual calorie needs. This information is not intended to replace advice given to you by your health care provider. Make sure you discuss any questions you have with your health care provider. Document Revised: 08/02/2017 Document Reviewed: 08/13/2016 Elsevier Patient Education  2020 Elsevier Inc.  

## 2020-05-03 NOTE — Progress Notes (Signed)
I,Laura E Walsh,acting as a scribe for Lavon Paganini, MD.,have documented all relevant documentation on the behalf of Lavon Paganini, MD,as directed by  Lavon Paganini, MD while in the presence of Lavon Paganini, MD.    Established patient visit   Patient: Nichole [name]   DOB: Feb 16, 1941   79 y.o. Female  MRN: [id number] Visit Date: 05/03/2020  Today's healthcare provider: Lavon Paganini, MD   Chief Complaint  Patient presents with  . Hyperlipidemia  . Hypothyroidism  . Osteoporosis   Subjective    HPI   Hypothyroid, follow-up  Lab Results  Component Value Date   TSH 1.480 01/28/2020   TSH 4.910 (H) 08/13/2019   TSH 1.150 02/13/2018   FREET4 0.95 02/13/2018   Wt Readings from Last 3 Encounters:  05/03/20 167 lb (75.8 kg)  02/07/20 165 lb (74.8 kg)  01/07/20 168 lb 6.4 oz (76.4 kg)    She was last seen for hypothyroid 8 months ago.  Management since that visit includes TSH undercorrected12/06/2019, Levothyroxine was changed to 70mcg five days a week and 26mcg two days a week. TSH is now normal on recheck 01/28/2020.  She reports excellent compliance with treatment. She is not having side effects.   Symptoms: No change in energy level No constipation  No diarrhea No heat / cold intolerance  No nervousness No palpitations  No weight changes    ----------------------------------------------------------------------------------------- Lipid/Cholesterol, Follow-up  Last lipid panel Other pertinent labs  Lab Results  Component Value Date   CHOL 145 01/28/2020   HDL 63 01/28/2020   LDLCALC 72 01/28/2020   TRIG 46 01/28/2020   CHOLHDL 2.3 01/28/2020   Lab Results  Component Value Date   ALT 13 01/28/2020   AST 19 01/28/2020   PLT 248 08/13/2019   TSH 1.480 01/28/2020     She was last seen for this 3 months ago.  Management since that visit includes No changes.  She reports excellent compliance with treatment. She is not having side  effects.   Symptoms: No chest pain No chest pressure/discomfort  No dyspnea No lower extremity edema  No numbness or tingling of extremity No orthopnea  No palpitations No paroxysmal nocturnal dyspnea  No speech difficulty No syncope   Current diet: in general, a "healthy" diet    The 10-year ASCVD risk score Mikey Bussing DC Jr., et al., 2013) is: 22.2%  ---------------------------------------------------------------------------------------------------  Follow up for Osteoporosis  The patient was last seen for this 1 months ago. Changes made at last visit include:  Pt is currently going to a specialist at [facility].  They will be starting her on Prolia after she has all of her dental work completed.Marland Kitchen  -----------------------------------------------------------------------------------------  Pt also needs refills for Clopidogrel 75mg .  She states she has been out of it for about 10 days.  Taking this lifelong for history of CVAs.    Patient Active Problem List   Diagnosis Date Noted  . Osteoporosis 05/04/2020  . Elevated BP without diagnosis of hypertension 05/03/2020  . Overweight 01/07/2020  . Generalized anxiety disorder with panic attacks   . History of CVA (cerebrovascular accident) 01/09/2019  . Asthma 01/09/2019  . PTSD (post-traumatic stress disorder)   . Hypothyroidism   . Diffuse cystic mastopathy   . Hypercholesteremia   . Benign breast cyst in female, left 08/13/2018  . 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 Fibromyalgia 09/03/1998   Social History   Tobacco Use  . Smoking status: Former Smoker  Quit date: 09/03/1961    Years since quitting: 58.7  . Smokeless tobacco: Never Used  . Tobacco comment: 10 months  Vaping Use  . Vaping Use: Never used  Substance Use Topics  . Alcohol use: No    Alcohol/week: 0.0 standard drinks  . Drug use: No   Allergies  Allergen Reactions  . Ciprocinonide [Fluocinolone] Diarrhea  .  Ciprofloxacin   . Flexeril [Cyclobenzaprine] Swelling  . Lyrica [Pregabalin] Swelling  . Other Other (See Comments)    Childhood reaction, uncertain what it was  . Savella [Milnacipran Hcl] Swelling  . Sulfa Antibiotics     Unknown  Childhood reaction, uncertain what it was  . Sulfamethoxazole     Reaction occurred as a child, pt doesn't know what the reaction was  . Keflex [Cephalexin] Rash     Medications: Outpatient Medications Prior to Visit  Medication Sig  . atorvastatin (LIPITOR) 40 MG tablet Take 1 tablet (40 mg total) by mouth daily at 6 PM.  . BELSOMRA 10 MG TABS Take 1 tablet by mouth daily.   . budesonide (PULMICORT FLEXHALER) 180 MCG/ACT inhaler Inhale 2 puffs into the lungs daily.  . Budesonide-Formoterol Fumarate (SYMBICORT IN) Inhale into the lungs as needed.  . busPIRone (BUSPAR) 15 MG tablet Take 15 mg by mouth 2 (two) times daily.   . DULoxetine (CYMBALTA) 60 MG capsule TAKE 1 CAPSULE BY MOUTH EVERY DAY  . gabapentin (NEURONTIN) 600 MG tablet TAKE 2 TABLETS (1,200 MG TOTAL) BY MOUTH 2 (TWO) TIMES DAILY.  Marland Kitchen levalbuterol (XOPENEX HFA) 45 MCG/ACT inhaler INHALE 2 PUFFS INTO THE LUNGS EVERY 4 (FOUR) HOURS AS NEEDED FOR WHEEZING OR SHORTNESS OF BREATH.  Marland Kitchen levothyroxine (SYNTHROID) 75 MCG tablet Take one tablet on an empty stomach two days a week.  . levothyroxine (SYNTHROID) 88 MCG tablet Take one tablet on an empty stomach five days a week.  . Naproxen Sodium 220 MG CAPS Take 220 mg by mouth 2 (two) times daily.  . pantoprazole (PROTONIX) 40 MG tablet TAKE 1 TABLET (40 MG TOTAL) BY MOUTH 2 (TWO) TIMES DAILY BEFORE A MEAL.  Marland Kitchen polyethylene glycol powder (GLYCOLAX/MIRALAX) powder Take 255 g by mouth once.  . [DISCONTINUED] clopidogrel (PLAVIX) 75 MG tablet TAKE 1 TABLET (75 MG TOTAL) BY MOUTH DAILY. PLEASE SCHEDULE OFFICE VISIT BEFORE ANY FUTURE REFILLS  . [DISCONTINUED] alendronate (FOSAMAX) 70 MG tablet Take 1 tablet (70 mg total) by mouth every 7 (seven) days. Take with  a full glass of water on an empty stomach. (Patient not taking: Reported on 05/03/2020)   No facility-administered medications prior to visit.    Review of Systems  Constitutional: Negative.   Respiratory: Negative.   Cardiovascular: Positive for leg swelling. Negative for chest pain and palpitations.  Gastrointestinal: Positive for constipation. Negative for abdominal distention, abdominal pain, anal bleeding, blood in stool, diarrhea, nausea, rectal pain and vomiting.  Musculoskeletal: Positive for arthralgias. Negative for back pain, gait problem, joint swelling, myalgias, neck pain and neck stiffness.  Neurological: Negative for dizziness, light-headedness and headaches.    Last CBC Lab Results  Component Value Date   WBC 5.3 08/13/2019   HGB 13.0 08/13/2019   HCT 36.9 08/13/2019   MCV 90 08/13/2019   MCH 31.6 08/13/2019   RDW 12.8 08/13/2019   PLT 248 46/56/8127   Last metabolic panel Lab Results  Component Value Date   GLUCOSE 160 (H) 01/28/2020   NA 138 01/28/2020   K 4.0 01/28/2020   CL 104 01/28/2020  CO2 20 01/28/2020   BUN 11 01/28/2020   CREATININE 0.74 01/28/2020   GFRNONAA 79 01/28/2020   GFRAA 91 01/28/2020   CALCIUM 9.2 01/28/2020   PROT 6.6 01/28/2020   ALBUMIN 4.4 01/28/2020   LABGLOB 2.2 01/28/2020   AGRATIO 2.0 01/28/2020   BILITOT 0.3 01/28/2020   ALKPHOS 125 (H) 01/28/2020   AST 19 01/28/2020   ALT 13 01/28/2020   ANIONGAP 6 10/16/2015   Last lipids Lab Results  Component Value Date   CHOL 145 01/28/2020   HDL 63 01/28/2020   LDLCALC 72 01/28/2020   TRIG 46 01/28/2020   CHOLHDL 2.3 01/28/2020   Last thyroid functions Lab Results  Component Value Date   TSH 1.480 01/28/2020    Objective    BP (!) 146/80 (BP Location: Left Arm, Patient Position: Sitting, Cuff Size: Large)   Pulse 71   Temp 98 F (36.7 C) (Oral)   Wt 167 lb (75.8 kg)   BMI 30.06 kg/m    Physical Exam Constitutional:      Appearance: Normal appearance.    HENT:     Head: Normocephalic and atraumatic.  Cardiovascular:     Rate and Rhythm: Normal rate and regular rhythm.     Pulses: Normal pulses.     Heart sounds: Normal heart sounds.  Pulmonary:     Effort: Pulmonary effort is normal. No respiratory distress.     Breath sounds: Normal breath sounds. No wheezing.  Abdominal:     General: There is no distension.     Palpations: Abdomen is soft.     Tenderness: There is no abdominal tenderness.  Musculoskeletal:     Cervical back: Normal range of motion and neck supple. No tenderness.     Right lower leg: No edema.     Left lower leg: No edema.  Lymphadenopathy:     Cervical: No cervical adenopathy.  Skin:    General: Skin is warm and dry.  Neurological:     Mental Status: She is alert and oriented to person, place, and time. Mental status is at baseline.  Psychiatric:        Mood and Affect: Mood normal.        Behavior: Behavior normal.       No results found for any visits on 05/03/20.  Assessment & Plan     Problem List Items Addressed This Visit      Endocrine   Hypothyroidism - Primary    Previously well controlled Continue Synthroid at current dose  Recheck TSH and adjust Synthroid as indicated          Musculoskeletal and Integument   Osteoporosis    Did not take bisphosphonate Reportedly starting Prolia soon Encourage weight bearing exercise         Other   Hypercholesteremia    Reviewed last lipid panel Continue Lipitor Goal LDL <70      History of CVA (cerebrovascular accident)    Continue Lipitor and Plavix Discussed importance of good blood pressure and cholesterol control Also followed by neurology      Elevated BP without diagnosis of hypertension    Reports normal home BPs She will call back with home BP readings Discussed lifestyle measures          Return in about 3 months (around 08/02/2020) for Follow up on BP.       I, Lavon Paganini, MD, have reviewed all  documentation for this visit. The documentation on 05/04/20 for the exam, diagnosis,  procedures, and orders are all accurate and complete.   Glennis Montenegro, Dionne Bucy, MD, MPH [facility]

## 2020-05-04 ENCOUNTER — Encounter: Payer: Self-pay | Admitting: Family Medicine

## 2020-05-04 DIAGNOSIS — M810 Age-related osteoporosis without current pathological fracture: Secondary | ICD-10-CM | POA: Insufficient documentation

## 2020-05-04 NOTE — Assessment & Plan Note (Signed)
Did not take bisphosphonate Reportedly starting Prolia soon Encourage weight bearing exercise

## 2020-05-04 NOTE — Assessment & Plan Note (Signed)
Continue Lipitor and Plavix Discussed importance of good blood pressure and cholesterol control Also followed by neurology

## 2020-05-04 NOTE — Assessment & Plan Note (Signed)
Previously well controlled Continue Synthroid at current dose  Recheck TSH and adjust Synthroid as indicated   

## 2020-05-04 NOTE — Assessment & Plan Note (Signed)
Reviewed last lipid panel Continue Lipitor Goal LDL <70

## 2020-05-04 NOTE — Assessment & Plan Note (Signed)
Reports normal home BPs She will call back with home BP readings Discussed lifestyle measures
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
## 2020-06-27 ENCOUNTER — Other Ambulatory Visit: Payer: Self-pay

## 2020-06-27 ENCOUNTER — Ambulatory Visit (INDEPENDENT_AMBULATORY_CARE_PROVIDER_SITE_OTHER): Payer: Medicare Other | Admitting: Neurology

## 2020-06-27 ENCOUNTER — Encounter: Payer: Self-pay | Admitting: Neurology

## 2020-06-27 VITALS — BP 137/76 | HR 78 | Resp 18 | Ht 62.0 in | Wt 165.0 lb

## 2020-06-27 DIAGNOSIS — G4485 Primary stabbing headache: Secondary | ICD-10-CM

## 2020-06-27 DIAGNOSIS — G479 Sleep disorder, unspecified: Secondary | ICD-10-CM | POA: Diagnosis not present

## 2020-06-27 NOTE — Patient Instructions (Signed)
Continue Cymbalta 60mg  daily Follow up in one year

## 2020-07-14 ENCOUNTER — Ambulatory Visit: Payer: Medicare Other | Admitting: Family Medicine

## 2020-07-27 ENCOUNTER — Other Ambulatory Visit: Payer: Self-pay | Admitting: Family Medicine

## 2020-08-04 ENCOUNTER — Other Ambulatory Visit: Payer: Self-pay | Admitting: Family Medicine

## 2020-08-04 ENCOUNTER — Ambulatory Visit: Payer: Medicare Other | Admitting: Family Medicine

## 2020-09-10 ENCOUNTER — Other Ambulatory Visit: Payer: Self-pay | Admitting: Family Medicine

## 2020-09-11 ENCOUNTER — Other Ambulatory Visit: Payer: Self-pay | Admitting: Family Medicine

## 2020-09-11 IMAGING — US US BREAST*L* LIMITED INC AXILLA
1 series · 5 of 5 positions shown · non-contrast
Comparison: Previous exam(s).

CLINICAL DATA: Callback from screening mammogram for possible
asymmetry left breast

EXAM:
DIGITAL DIAGNOSTIC left MAMMOGRAM WITH CAD AND TOMO
ULTRASOUND left BREAST

[Series 1: us breast*left* limited inc axilla · 0.05mm/px · 5 of 5 slices shown]
[im 1/5]
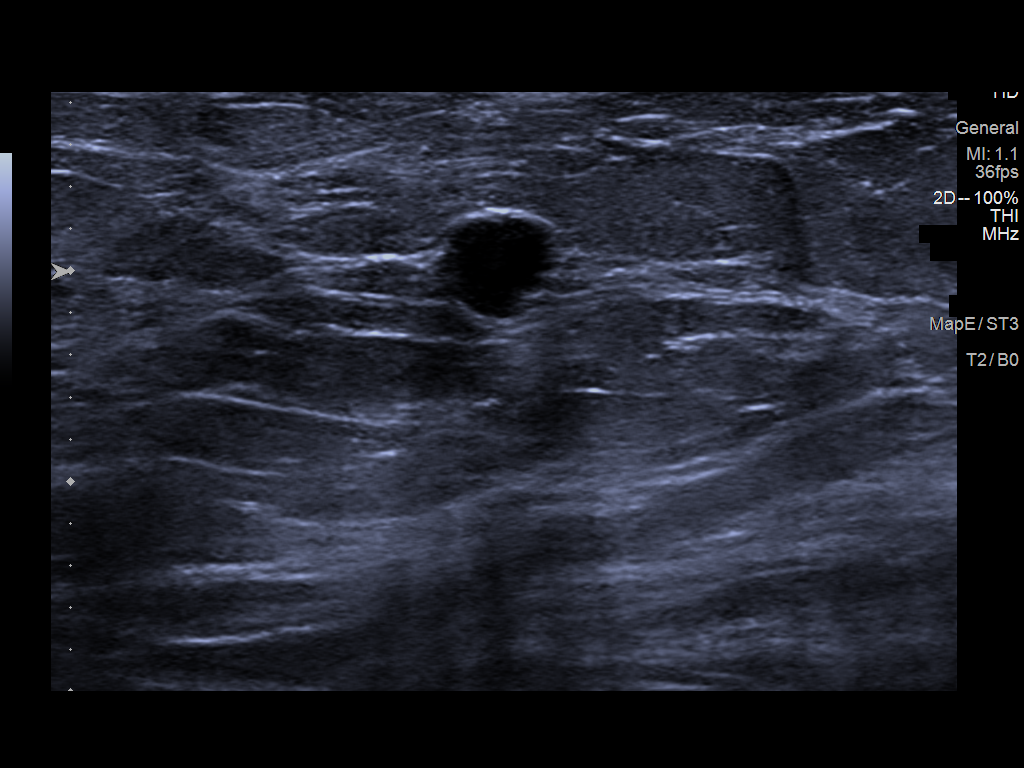
[im 2/5]
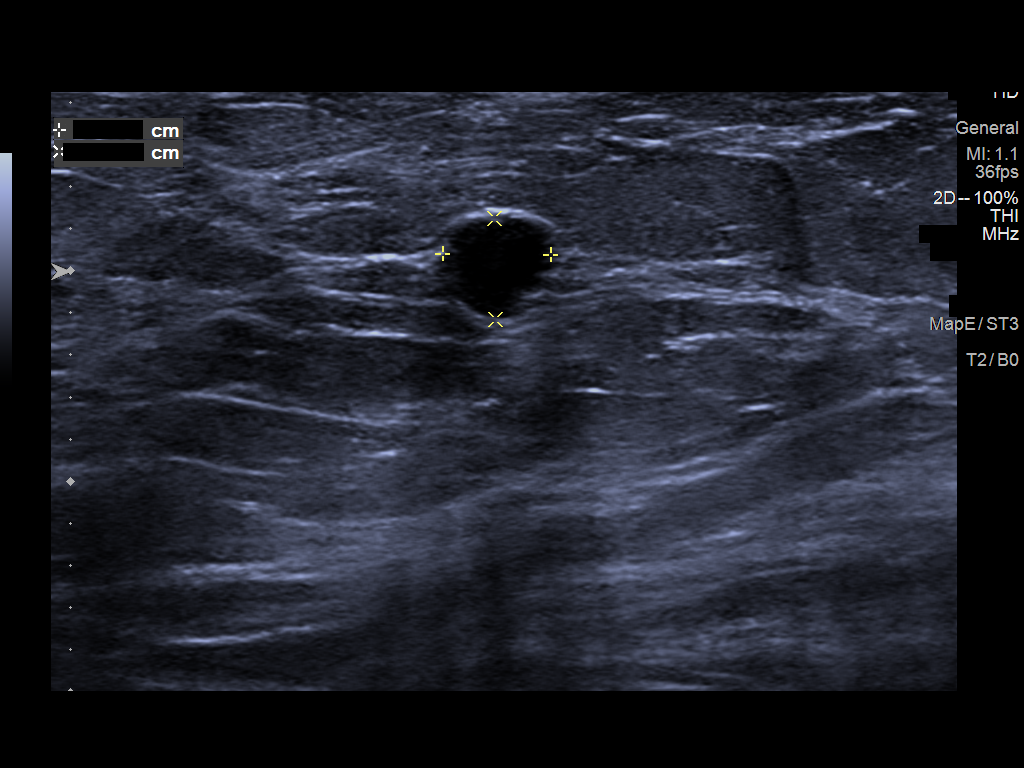
[im 3/5]
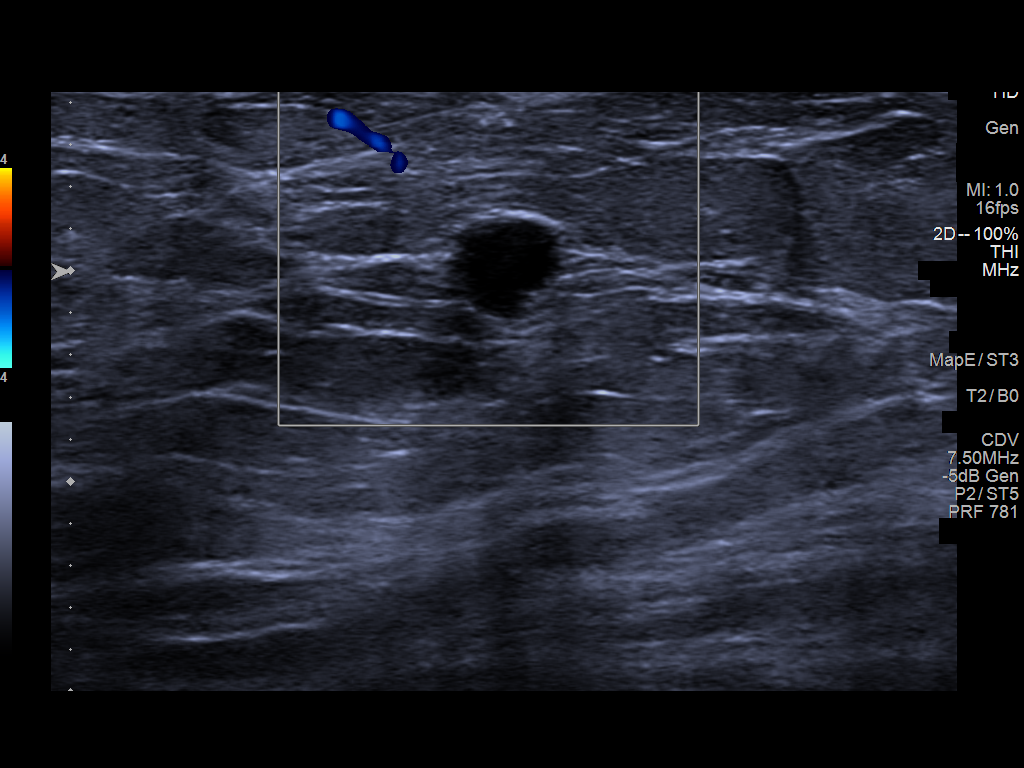
[im 4/5]
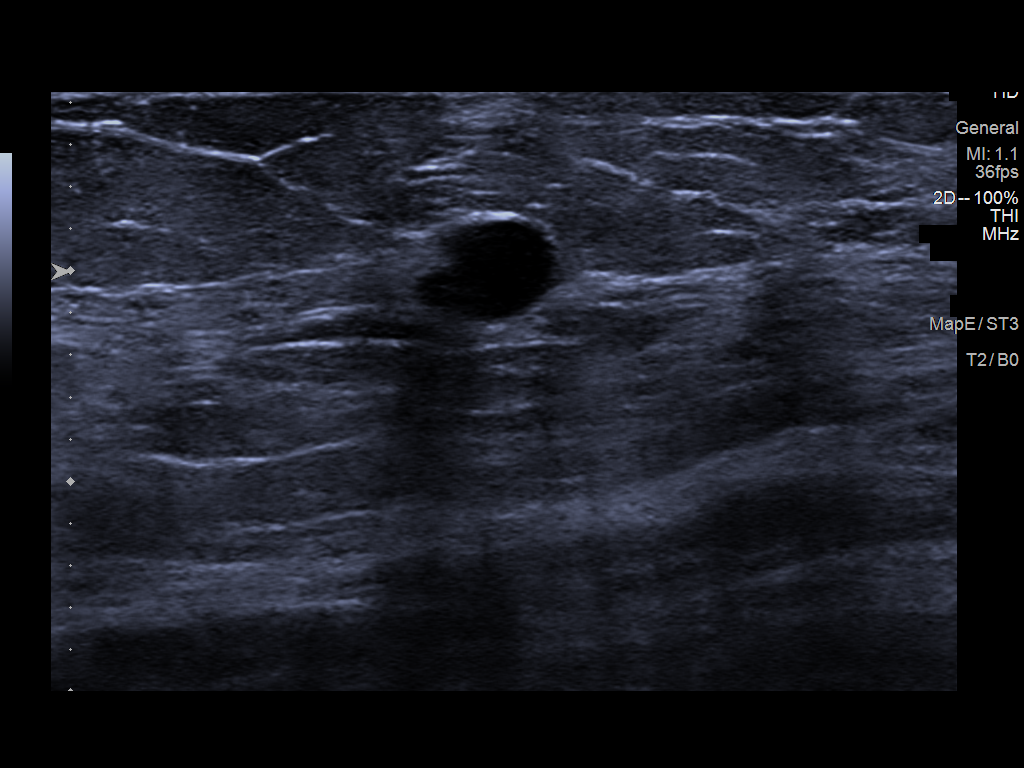
[im 5/5]
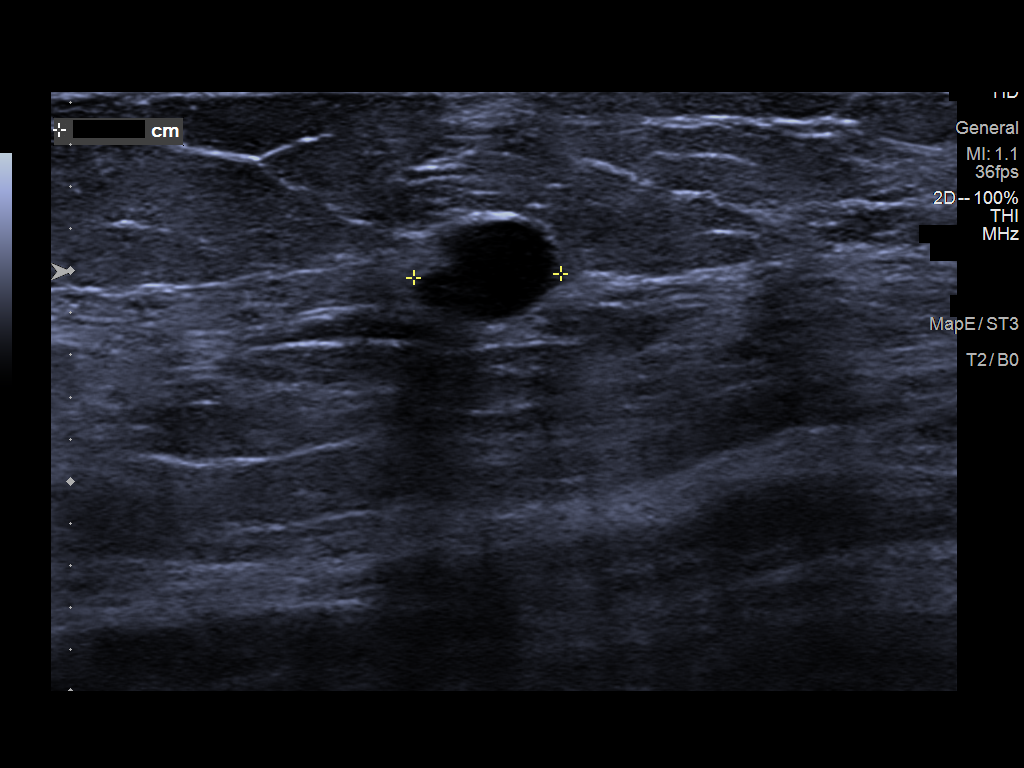

[5 of 5 positions shown; findings below may reference images not displayed]

ACR Breast Density Category b: There are scattered areas of
fibroglandular density.
FINDINGS: Lateral view of left breast, spot compression left CC view are
submitted. Previously questioned asymmetry in the lateral left
breast persists on additional views.

Mammographic images were processed with CAD.

Targeted ultrasound is performed, showing 5 mm simple cyst at left
breast 3 o'clock 3 cm from nipple correlating to the mammographic
asymmetry.
IMPRESSION: Benign findings.

RECOMMENDATION:
Routine screening mammogram back on schedule.

I have discussed the findings and recommendations with the patient.
If applicable, a reminder letter will be sent to the patient
regarding the next appointment.

BI-RADS CATEGORY  2: Benign.

## 2020-09-11 IMAGING — MG MM DIGITAL DIAGNOSTIC UNILAT*L* W/ TOMO W/ CAD
4 series · 4 of 12 positions shown · non-contrast
Comparison: Previous exam(s).

CLINICAL DATA: Callback from screening mammogram for possible
asymmetry left breast

EXAM:
DIGITAL DIAGNOSTIC left MAMMOGRAM WITH CAD AND TOMO
ULTRASOUND left BREAST

[L ML synth-2D]
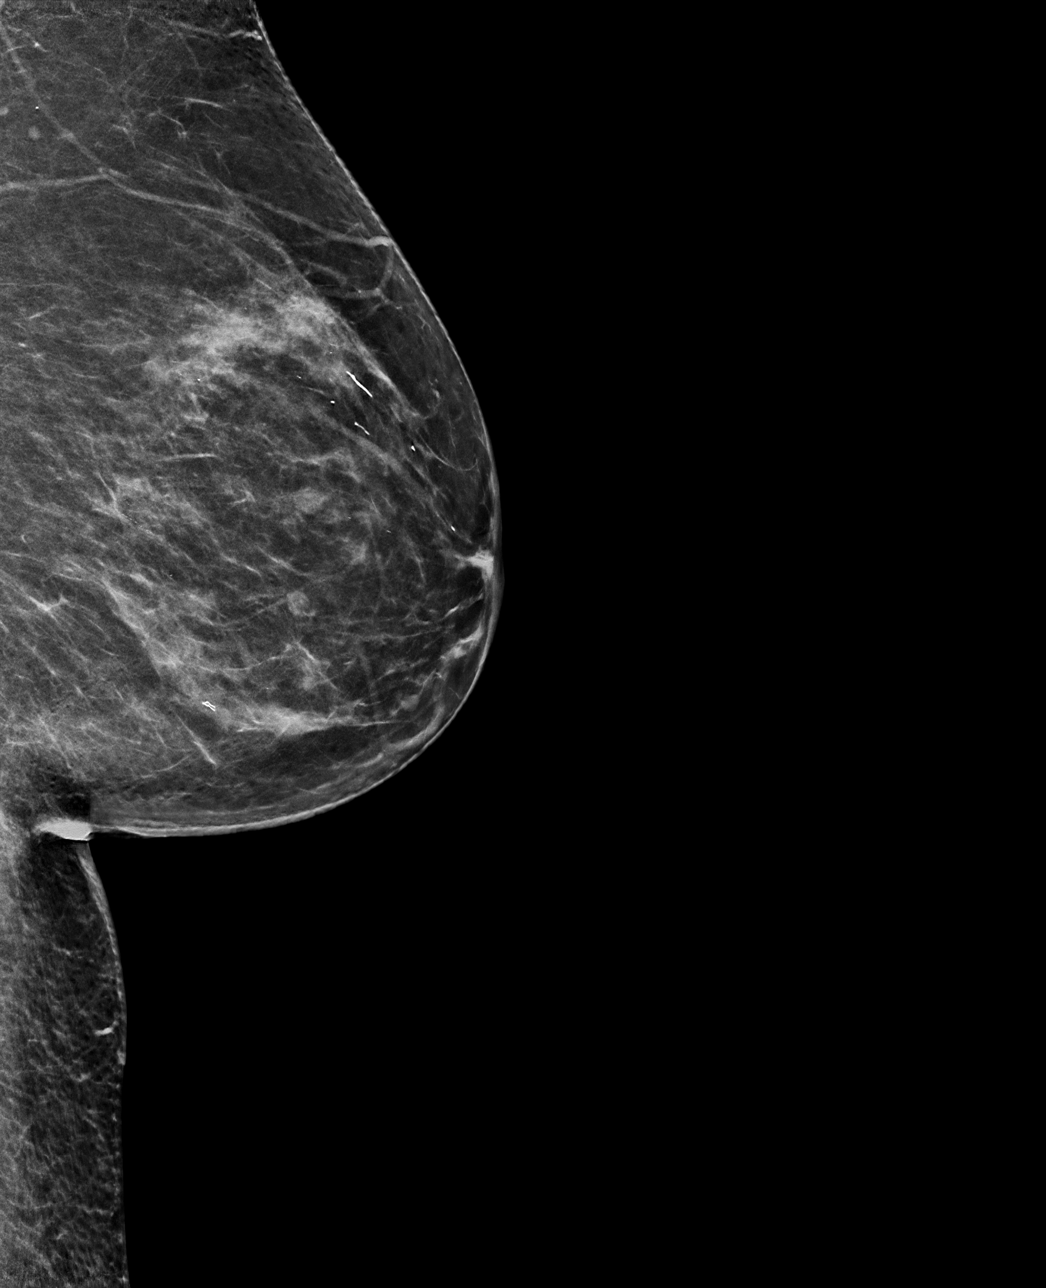

[L CC synth-2D]
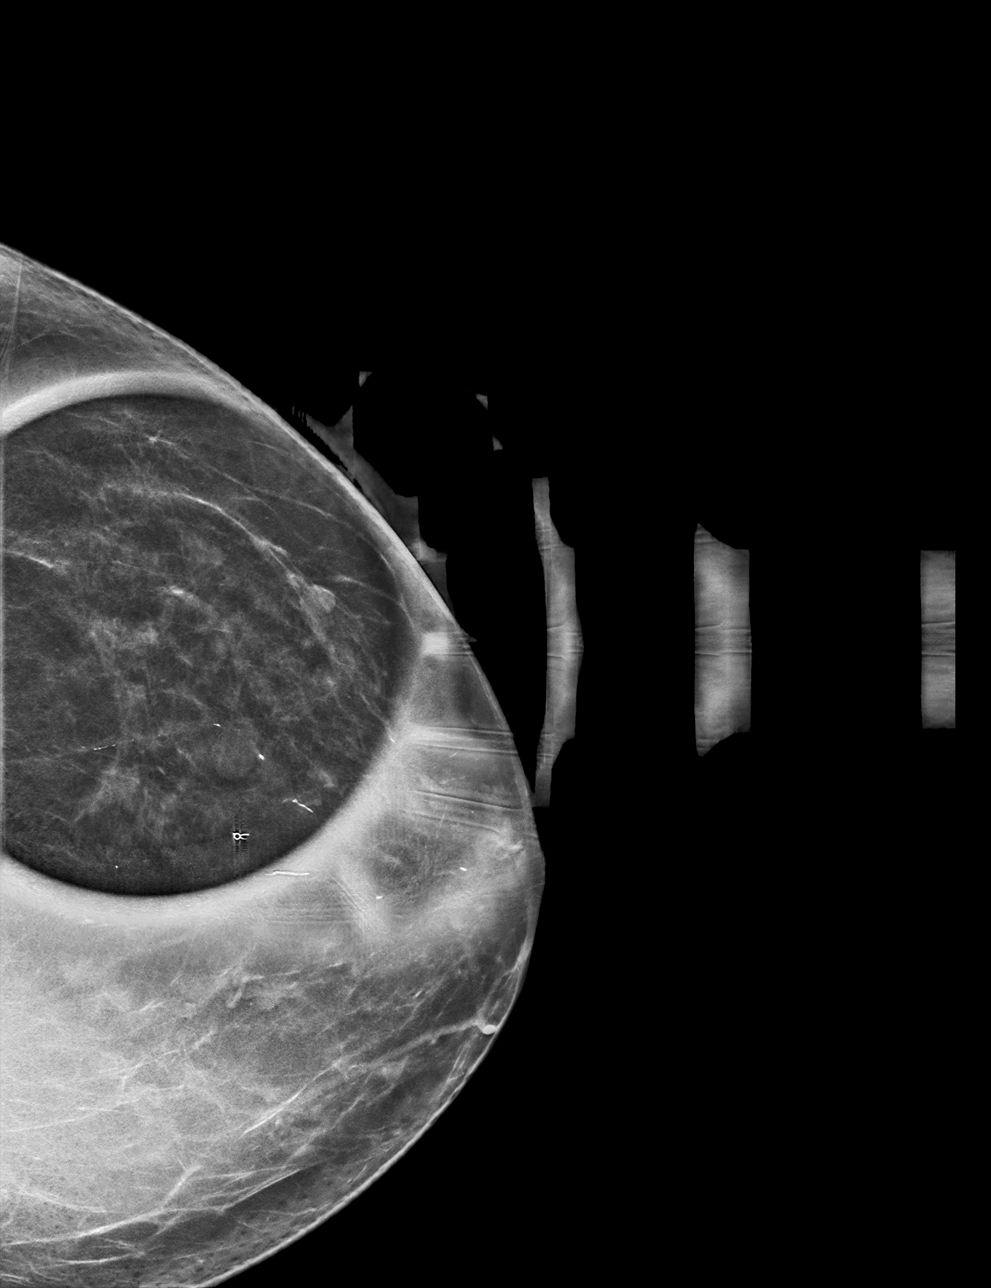

[L CC tomo · tomo slice 29/56.0]
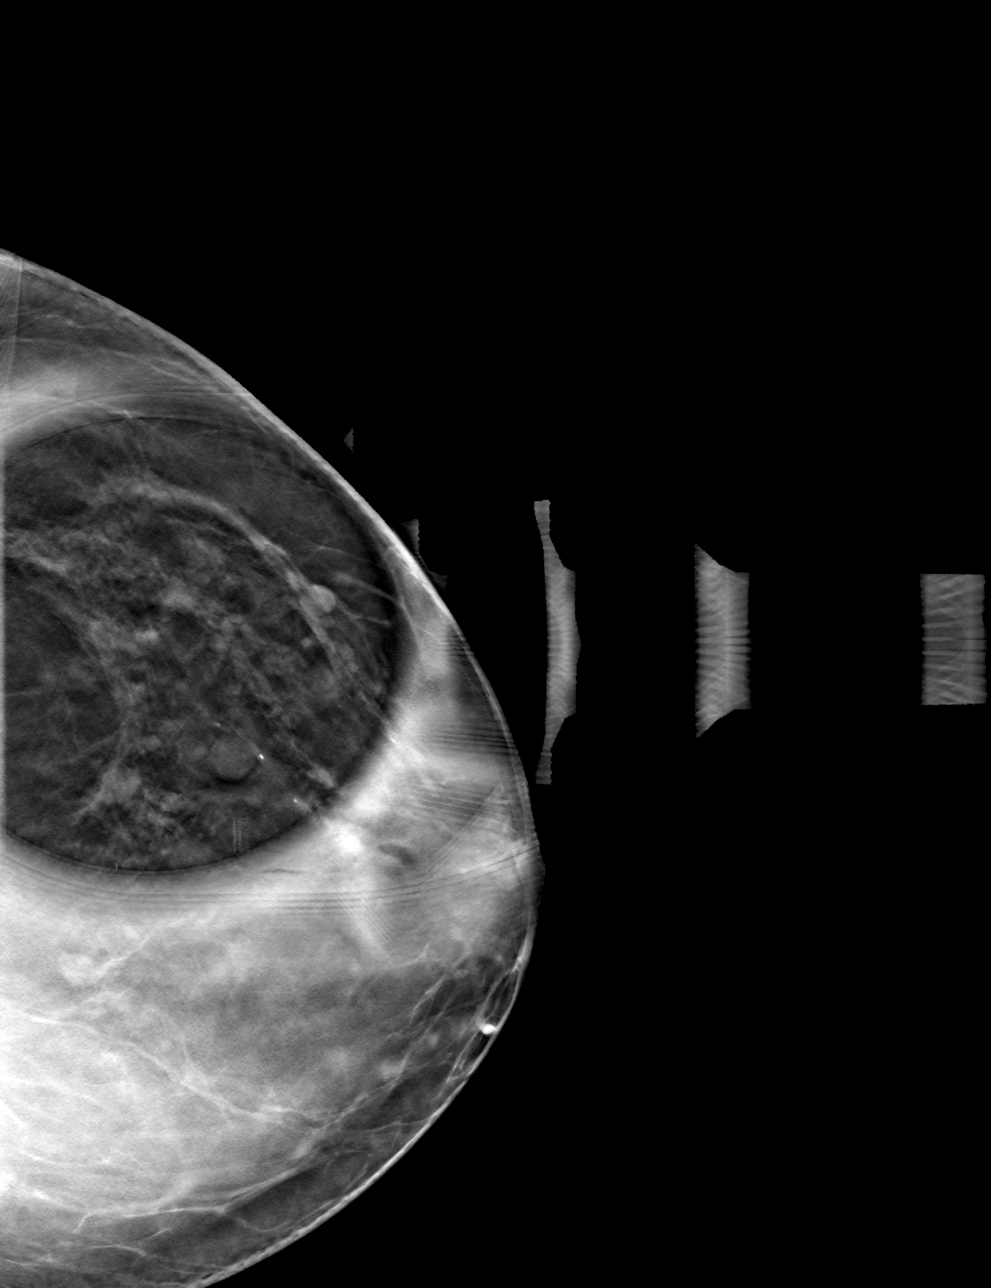

[L ML tomo · tomo slice 31/60.0]
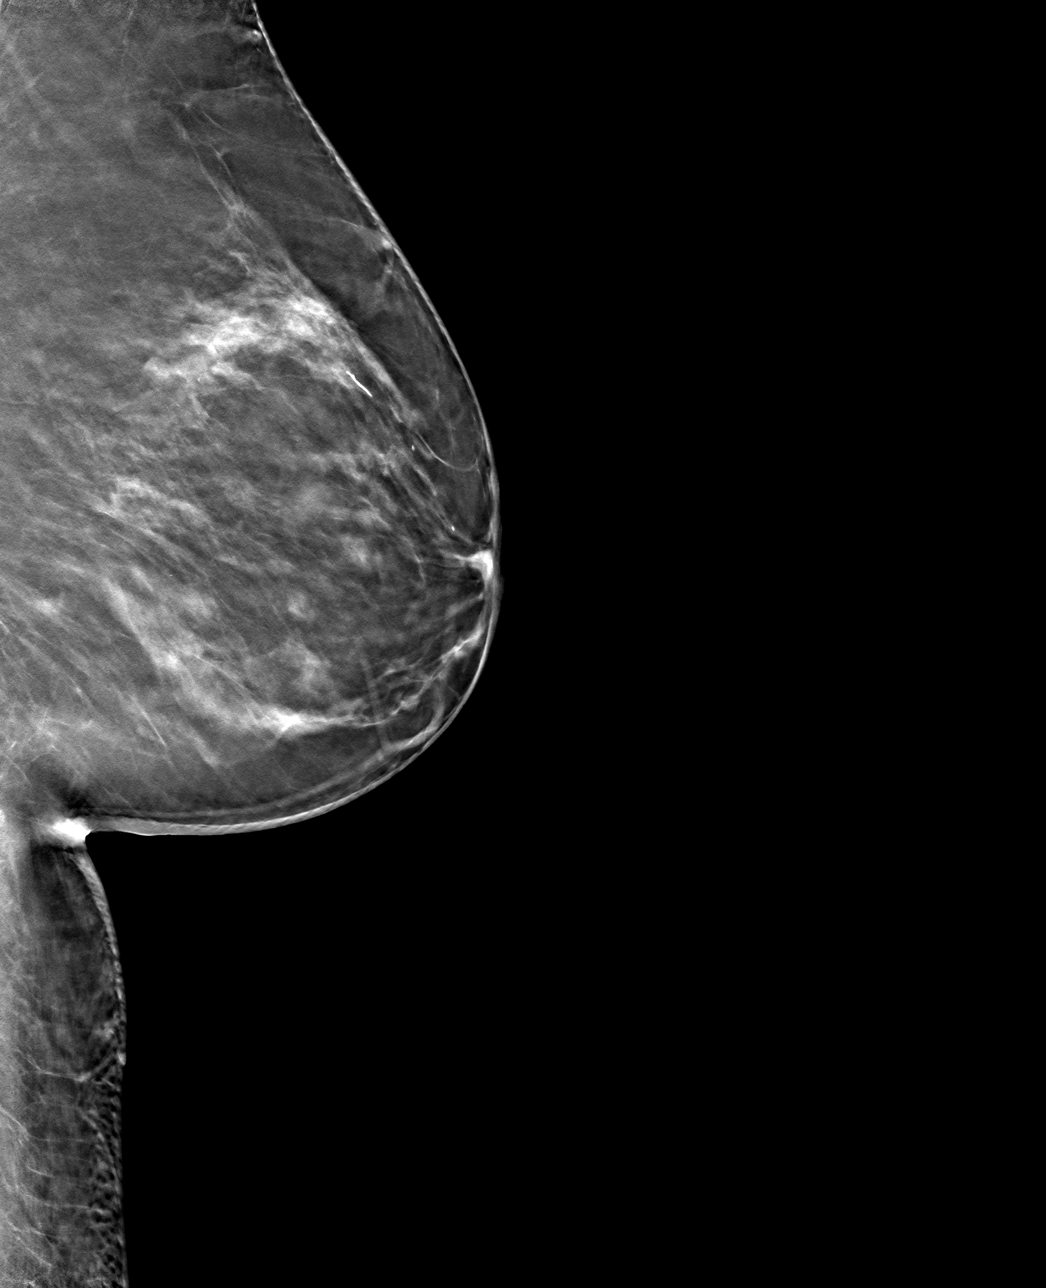

[4 of 12 positions shown; findings below may reference images not displayed]

ACR Breast Density Category b: There are scattered areas of
fibroglandular density.
FINDINGS: Lateral view of left breast, spot compression left CC view are
submitted. Previously questioned asymmetry in the lateral left
breast persists on additional views.

Mammographic images were processed with CAD.

Targeted ultrasound is performed, showing 5 mm simple cyst at left
breast 3 o'clock 3 cm from nipple correlating to the mammographic
asymmetry.
IMPRESSION: Benign findings.

RECOMMENDATION:
Routine screening mammogram back on schedule.

I have discussed the findings and recommendations with the patient.
If applicable, a reminder letter will be sent to the patient
regarding the next appointment.

BI-RADS CATEGORY  2: Benign.

## 2020-09-25 ENCOUNTER — Other Ambulatory Visit: Payer: Self-pay | Admitting: Family Medicine

## 2020-10-14 ENCOUNTER — Other Ambulatory Visit: Payer: Self-pay | Admitting: Family Medicine

## 2020-10-25 DIAGNOSIS — F331 Major depressive disorder, recurrent, moderate: Secondary | ICD-10-CM | POA: Diagnosis not present

## 2020-12-19 DIAGNOSIS — F331 Major depressive disorder, recurrent, moderate: Secondary | ICD-10-CM | POA: Diagnosis not present

## 2020-12-28 ENCOUNTER — Other Ambulatory Visit: Payer: Self-pay | Admitting: Family Medicine

## 2020-12-28 NOTE — Telephone Encounter (Signed)
Requested Prescriptions  Pending Prescriptions Disp Refills  . levothyroxine (SYNTHROID) 88 MCG tablet [Pharmacy Med Name: LEVOTHYROXINE 88 MCG TABLET] 60 tablet 1    Sig: TAKE ONE TABLET ON AN EMPTY STOMACH FIVE DAYS A WEEK.     Endocrinology:  Hypothyroid Agents Failed - 12/28/2020  1:15 AM      Failed - TSH needs to be rechecked within 3 months after an abnormal result. Refill until TSH is due.      Passed - TSH in normal range and within 360 days    TSH  Date Value Ref Range Status  01/28/2020 1.480 0.450 - 4.500 uIU/mL Final         Passed - Valid encounter within last 12 months    Recent Outpatient Visits          7 months ago Acquired hypothyroidism   [facility] [place], Dionne Bucy, MD   11 months ago Acquired hypothyroidism   [facility], Dionne Bucy, MD   1 year ago Acquired hypothyroidism   [facility] [place], Dionne Bucy, MD   1 year ago [place], Dionne Bucy, MD      Future Appointments            In 2 weeks [place], Dionne Bucy, MD [facility] [place]

## 2021-01-12 ENCOUNTER — Other Ambulatory Visit: Payer: Self-pay

## 2021-01-12 ENCOUNTER — Ambulatory Visit: Payer: Medicare Other

## 2021-01-12 ENCOUNTER — Encounter: Payer: Self-pay | Admitting: Family Medicine

## 2021-01-12 ENCOUNTER — Ambulatory Visit (INDEPENDENT_AMBULATORY_CARE_PROVIDER_SITE_OTHER): Payer: Medicare Other | Admitting: Family Medicine

## 2021-01-12 VITALS — BP 116/57 | HR 68 | Temp 98.3°F | Resp 16 | Ht 61.5 in | Wt 171.3 lb

## 2021-01-12 DIAGNOSIS — M810 Age-related osteoporosis without current pathological fracture: Secondary | ICD-10-CM

## 2021-01-12 DIAGNOSIS — Z6832 Body mass index (BMI) 32.0-32.9, adult: Secondary | ICD-10-CM

## 2021-01-12 DIAGNOSIS — E66811 Obesity, class 1: Secondary | ICD-10-CM

## 2021-01-12 DIAGNOSIS — Z0000 Encounter for general adult medical examination without abnormal findings: Secondary | ICD-10-CM

## 2021-01-12 DIAGNOSIS — E669 Obesity, unspecified: Secondary | ICD-10-CM

## 2021-01-12 DIAGNOSIS — E7800 Pure hypercholesterolemia, unspecified: Secondary | ICD-10-CM | POA: Diagnosis not present

## 2021-01-12 DIAGNOSIS — Z1159 Encounter for screening for other viral diseases: Secondary | ICD-10-CM

## 2021-01-12 DIAGNOSIS — E039 Hypothyroidism, unspecified: Secondary | ICD-10-CM | POA: Diagnosis not present

## 2021-01-12 DIAGNOSIS — Z1231 Encounter for screening mammogram for malignant neoplasm of breast: Secondary | ICD-10-CM | POA: Diagnosis not present

## 2021-01-12 DIAGNOSIS — F331 Major depressive disorder, recurrent, moderate: Secondary | ICD-10-CM | POA: Diagnosis not present

## 2021-01-12 NOTE — Assessment & Plan Note (Signed)
Discussed importance of healthy weight management Discussed diet and exercise  

## 2021-01-12 NOTE — Assessment & Plan Note (Signed)
Encourage weight bearing exercises Did not take bisphosphonate Will be starting prolia soon - had dental eval

## 2021-01-12 NOTE — Progress Notes (Signed)
Annual Wellness Visit     Patient: Nichole [name], Female    DOB: August 26, 1941, 80 y.o.   MRN: [id number] Visit Date: 01/12/2021  Today's Provider: Lavon Paganini, MD   Chief Complaint  Patient presents with  . Medicare Wellness   Subjective    Nichole [name] is a 80 y.o. female who presents today for her Annual Wellness Visit. She reports consuming a general diet. The patient does not participate in regular exercise at present. She generally feels fairly well. She reports sleeping fairly well. She does have additional problems to discuss today.   HPI  Upper Left Quadrant Pain She is has pain in her left upper quadrant and it radiates to her back. She apply a cold compress and she uses voltaren and they provide some relief.   - benign MSK - exercises given  Insomnia  Her psychiatrist wanted her to participate in a sleep study because of her trouble sleeping. He prescribed her medication . She was on [place] and found herself having an altered mental status.   Osteoporosis  She denies getting prolia injections for her osteoporosis because her dental hygienist recommended her to wait until she gets her dentures.   08/19/19 Mammogram-BI-RADS 2 03/04/19 BMD-Osteoporosis 09/28/15 Colonoscopy-normal  Patient Active Problem List   Diagnosis Date Noted  . Osteoporosis 05/04/2020  . Obesity 01/07/2020  . Generalized anxiety disorder with panic attacks   . History of CVA (cerebrovascular accident) 01/09/2019  . Asthma 01/09/2019  . PTSD (post-traumatic stress disorder)   . Hypothyroidism   . Diffuse cystic mastopathy   . Hypercholesteremia   . Benign breast cyst in female, left 08/13/2018  . 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 Fibromyalgia 09/03/1998   Social History   Tobacco Use  . Smoking status: Former Smoker    Quit date: 09/03/1961    Years since quitting: 59.4  . Smokeless tobacco: Never Used  . Tobacco comment: 10 months   Vaping Use  . Vaping Use: Never used  Substance Use Topics  . Alcohol use: No    Alcohol/week: 0.0 standard drinks  . Drug use: No   Allergies  Allergen Reactions  . Ciprocinonide [Fluocinolone] Diarrhea  . Ciprofloxacin   . Flexeril [Cyclobenzaprine] Swelling  . Lyrica [Pregabalin] Swelling  . Other Other (See Comments)    Childhood reaction, uncertain what it was  . Savella [Milnacipran Hcl] Swelling  . Sulfa Antibiotics     Unknown  Childhood reaction, uncertain what it was  . Sulfamethoxazole     Reaction occurred as a child, pt doesn't know what the reaction was  . Keflex [Cephalexin] Rash       Medications: Outpatient Medications Prior to Visit  Medication Sig  . atorvastatin (LIPITOR) 40 MG tablet TAKE 1 TABLET (40 MG TOTAL) BY MOUTH DAILY AT 6 PM.  . budesonide (PULMICORT FLEXHALER) 180 MCG/ACT inhaler Inhale 2 puffs into the lungs daily.  . Budesonide-Formoterol Fumarate (SYMBICORT IN) Inhale into the lungs as needed.  . busPIRone (BUSPAR) 15 MG tablet Take 15 mg by mouth. 15 mg in the morning and 30 mg in the evening  . clopidogrel (PLAVIX) 75 MG tablet Take 1 tablet (75 mg total) by mouth daily. Please schedule office visit before any future refills  . DULoxetine (CYMBALTA) 60 MG capsule TAKE 1 CAPSULE BY MOUTH EVERY DAY  . gabapentin (NEURONTIN) 600 MG tablet TAKE 2 TABLETS (1,200 MG TOTAL) BY MOUTH 2 (TWO) TIMES DAILY.  Marland Kitchen levalbuterol (  XOPENEX HFA) 45 MCG/ACT inhaler INHALE 2 PUFFS INTO THE LUNGS EVERY 4 (FOUR) HOURS AS NEEDED FOR WHEEZING OR SHORTNESS OF BREATH.  Marland Kitchen levothyroxine (SYNTHROID) 75 MCG tablet Take one tablet on an empty stomach two days a week.  . levothyroxine (SYNTHROID) 88 MCG tablet TAKE ONE TABLET ON AN EMPTY STOMACH FIVE DAYS A WEEK.  . Naproxen Sodium 220 MG CAPS Take 220 mg by mouth 2 (two) times daily.  . pantoprazole (PROTONIX) 40 MG tablet TAKE 1 TABLET (40 MG TOTAL) BY MOUTH 2 (TWO) TIMES DAILY BEFORE A MEAL.  Marland Kitchen polyethylene glycol  powder (GLYCOLAX/MIRALAX) powder Take 255 g by mouth once.  . temazepam (RESTORIL) 30 MG capsule Take 30 mg by mouth at bedtime.  . [DISCONTINUED] BELSOMRA 10 MG TABS Take 1 tablet by mouth daily.  (Patient not taking: Reported on 01/12/2021)   No facility-administered medications prior to visit.    Allergies  Allergen Reactions  . Ciprocinonide [Fluocinolone] Diarrhea  . Ciprofloxacin   . Flexeril [Cyclobenzaprine] Swelling  . Lyrica [Pregabalin] Swelling  . Other Other (See Comments)    Childhood reaction, uncertain what it was  . Savella [Milnacipran Hcl] Swelling  . Sulfa Antibiotics     Unknown  Childhood reaction, uncertain what it was  . Sulfamethoxazole     Reaction occurred as a child, pt doesn't know what the reaction was  . Keflex [Cephalexin] Rash    Patient Care Team: Erasmo Downer, MD as PCP - General (Family Medicine) Drema Dallas, DO as Consulting Physician (Neurology) Archer Asa, MD as Consulting Physician (Psychiatry) Lemar Livings Merrily Pew, MD as Consulting Physician (General Surgery) Claria Dice, MD as Attending Physician (Physical Medicine and Rehabilitation) Marcene Corning, MD as Consulting Physician (Orthopedic Surgery)  Review of Systems  Constitutional: Positive for appetite change. Negative for activity change, chills, fatigue and fever.  HENT: Negative.  Negative for ear pain, nosebleeds, sinus pressure, sinus pain and sore throat.   Eyes: Negative.  Negative for pain.  Respiratory: Negative.  Negative for cough, chest tightness, shortness of breath and wheezing.   Cardiovascular: Negative.  Negative for chest pain, palpitations and leg swelling.  Gastrointestinal: Positive for abdominal pain. Negative for blood in stool, diarrhea, nausea and vomiting.  Endocrine: Negative.   Genitourinary: Negative.  Negative for dysuria, flank pain, frequency, pelvic pain and urgency.  Musculoskeletal: Positive for arthralgias and back pain. Negative for  neck pain and neck stiffness.  Skin: Negative.   Allergic/Immunologic: Positive for environmental allergies.  Neurological: Positive for headaches. Negative for dizziness, syncope, weakness, light-headedness and numbness.  Hematological: Negative.   Psychiatric/Behavioral: Positive for sleep disturbance. The patient is nervous/anxious.     Last CBC Lab Results  Component Value Date   WBC 5.3 08/13/2019   HGB 13.0 08/13/2019   HCT 36.9 08/13/2019   MCV 90 08/13/2019   MCH 31.6 08/13/2019   RDW 12.8 08/13/2019   PLT 248 08/13/2019   Last metabolic panel Lab Results  Component Value Date   GLUCOSE 160 (H) 01/28/2020   NA 138 01/28/2020   K 4.0 01/28/2020   CL 104 01/28/2020   CO2 20 01/28/2020   BUN 11 01/28/2020   CREATININE 0.74 01/28/2020   GFRNONAA 79 01/28/2020   GFRAA 91 01/28/2020   CALCIUM 9.2 01/28/2020   PROT 6.6 01/28/2020   ALBUMIN 4.4 01/28/2020   LABGLOB 2.2 01/28/2020   AGRATIO 2.0 01/28/2020   BILITOT 0.3 01/28/2020   ALKPHOS 125 (H) 01/28/2020   AST 19 01/28/2020  ALT 13 01/28/2020   ANIONGAP 6 10/16/2015   Last lipids Lab Results  Component Value Date   CHOL 145 01/28/2020   HDL 63 01/28/2020   LDLCALC 72 01/28/2020   TRIG 46 01/28/2020   CHOLHDL 2.3 01/28/2020   Last hemoglobin A1c No results found for: HGBA1C Last thyroid functions Lab Results  Component Value Date   TSH 1.480 01/28/2020   Last vitamin B12 and Folate Lab Results  Component Value Date   VITAMINB12 538 02/13/2018        Objective    Vitals: BP (!) 116/57 (BP Location: Right Arm, Patient Position: Sitting, Cuff Size: Large)   Pulse 68   Temp 98.3 F (36.8 C) (Oral)   Resp 16   Ht 5' 1.5" (1.562 m)   Wt 171 lb 4.8 oz (77.7 kg)   SpO2 97%   BMI 31.84 kg/m  BP Readings from Last 3 Encounters:  01/12/21 (!) 116/57  06/27/20 137/76  05/03/20 (!) 146/80   Wt Readings from Last 3 Encounters:  01/12/21 171 lb 4.8 oz (77.7 kg)  06/27/20 165 lb (74.8 kg)   05/03/20 167 lb (75.8 kg)      Physical Exam Vitals reviewed.  Constitutional:      General: She is not in acute distress.    Appearance: Normal appearance. She is well-developed. She is not diaphoretic.  HENT:     Head: Normocephalic and atraumatic.  Eyes:     General: No scleral icterus.    Conjunctiva/sclera: Conjunctivae normal.  Neck:     Thyroid: No thyromegaly.  Cardiovascular:     Rate and Rhythm: Normal rate and regular rhythm.     Pulses: Normal pulses.     Heart sounds: Normal heart sounds. No murmur heard.   Pulmonary:     Effort: Pulmonary effort is normal. No respiratory distress.     Breath sounds: Normal breath sounds. No wheezing, rhonchi or rales.  Musculoskeletal:     Cervical back: Neck supple.     Right lower leg: No edema.     Left lower leg: No edema.     Comments: diffused TTP over thoracic back and left flank  Lymphadenopathy:     Cervical: No cervical adenopathy.  Skin:    General: Skin is warm and dry.     Findings: No rash.  Neurological:     Mental Status: She is alert and oriented to person, place, and time. Mental status is at baseline.  Psychiatric:        Mood and Affect: Mood normal.        Behavior: Behavior normal.     Most recent functional status assessment: In your present state of health, do you have any difficulty performing the following activities: 01/12/2021  Hearing? N  Vision? Y  Difficulty concentrating or making decisions? Y  Walking or climbing stairs? Y  Dressing or bathing? N  Doing errands, shopping? N  Some recent data might be hidden   Most recent fall risk assessment: Fall Risk  01/12/2021  Falls in the past year? 1  Number falls in past yr: 1  Injury with Fall? 1  Risk for fall due to : History of fall(s)  Risk for fall due to: Comment -  Follow up Falls evaluation completed;Education provided;Falls prevention discussed    Most recent depression screenings: PHQ 2/9 Scores 01/12/2021 05/03/2020  PHQ -  2 Score 0 0  PHQ- 9 Score 9 10   Most recent cognitive screening: 6CIT Screen 01/12/2021  What Year? 0 points  What month? 0 points  What time? 0 points  Count back from 20 0 points  Months in reverse 0 points  Repeat phrase 0 points  Total Score 0   Most recent Audit-C alcohol use screening Alcohol Use Disorder Test (AUDIT) 01/12/2021  1. How often do you have a drink containing alcohol? 0  2. How many drinks containing alcohol do you have on a typical day when you are drinking? 0  3. How often do you have six or more drinks on one occasion? 0  AUDIT-C Score 0  Alcohol Brief Interventions/Follow-up -   A score of 3 or more in women, and 4 or more in men indicates increased risk for alcohol abuse, EXCEPT if all of the points are from question 1   No results found for any visits on 01/12/21.  Assessment & Plan     Problem List Items Addressed This Visit      Endocrine   Hypothyroidism    Previously well controlled Continue Synthroid at current dose  Recheck TSH and adjust Synthroid as indicated       Relevant Orders   TSH     Musculoskeletal and Integument   Osteoporosis    Encourage weight bearing exercises Did not take bisphosphonate Will be starting prolia soon - had dental eval      Relevant Orders   VITAMIN D 25 Hydroxy (Vit-D Deficiency, Fractures)     Other   Hypercholesteremia    Previously well controlled Continue statin Repeat FLP and CMP      Relevant Orders   Comprehensive metabolic panel   Lipid panel   Obesity    Discussed importance of healthy weight management Discussed diet and exercise       Relevant Orders   Comprehensive metabolic panel   Lipid panel    Other Visit Diagnoses    Encounter for annual wellness visit (AWV) in Medicare patient    -  Primary   Encounter for screening mammogram for malignant neoplasm of breast       Relevant Orders   MM 3D SCREEN BREAST BILATERAL   Need for hepatitis C screening test       Relevant  Orders   Hepatitis C Antibody      Annual wellness visit done today including the all of the following: Reviewed patient's Family Medical History Reviewed and updated list of patient's medical providers Assessment of cognitive impairment was done Assessed patient's functional ability Established a written schedule for health screening [place] Completed and Reviewed  Exercise Activities and Dietary recommendations Goals    . DIET - INCREASE WATER INTAKE     Recommend to remove any items from the home that may cause slips or trips.       Immunization History  Administered Date(s) Administered  . Fluad Quad(high Dose 65+) 06/14/2020  . Influenza, High Dose Seasonal PF 06/12/2017, 04/24/2019  . PFIZER(Purple Top)SARS-COV-2 Vaccination 10/01/2019, 10/23/2019, 06/14/2020  . Pneumococcal Conjugate-13 06/12/2017  . Pneumococcal Polysaccharide-23 09/01/2012  . Tdap 11/27/2016  . Zoster Recombinat (Shingrix) 06/12/2017    Health Maintenance  Topic Date Due  . Hepatitis C Screening  Never done  . COVID-19 Vaccine (4 - Booster for [place] series) 09/14/2020  . INFLUENZA VACCINE  04/03/2021  . TETANUS/TDAP  11/28/2026  . DEXA SCAN  Completed  . PNA vac Low Risk Adult  Completed  . HPV VACCINES  Aged Out     Discussed health benefits of physical  activity, and encouraged her to engage in regular exercise appropriate for her age and condition.      Return in about 1 year (around 01/12/2022) for AWV, chronic disease f/u.      I,Essence Turner,acting as a Education administrator for Lavon Paganini, MD.,have documented all relevant documentation on the behalf of Lavon Paganini, MD,as directed by  Lavon Paganini, MD while in the presence of Lavon Paganini, MD.  I, Lavon Paganini, MD, have reviewed all documentation for this visit. The documentation on 01/12/21 for the exam, diagnosis, procedures, and orders are all accurate and complete.   Nikolus Marczak, Dionne Bucy,  MD, MPH [facility]

## 2021-01-12 NOTE — Assessment & Plan Note (Signed)
Previously well controlled Continue statin Repeat FLP and CMP  

## 2021-01-12 NOTE — Patient Instructions (Addendum)
Preventive Care 78 Years and Older, Female Preventive care refers to lifestyle choices and visits with your health care provider that can promote health and wellness. This includes:  A yearly physical exam. This is also called an annual wellness visit.  Regular dental and eye exams.  Immunizations.  Screening for certain conditions.  Healthy lifestyle choices, such as: ? Eating a healthy diet. ? Getting regular exercise. ? Not using drugs or products that contain nicotine and tobacco. ? Limiting alcohol use. What can I expect for my preventive care visit? Physical exam Your health care provider will check your:  Height and weight. These may be used to calculate your BMI (body mass index). BMI is a measurement that tells if you are at a healthy weight.  Heart rate and blood pressure.  Body temperature.  Skin for abnormal spots. Counseling Your health care provider may ask you questions about your:  Past medical problems.  Family's medical history.  Alcohol, tobacco, and drug use.  Emotional well-being.  Home life and relationship well-being.  Sexual activity.  Diet, exercise, and sleep habits.  History of falls.  Memory and ability to understand (cognition).  Work and work Statistician.  Pregnancy and menstrual history.  Access to firearms. What immunizations do I need? Vaccines are usually given at various ages, according to a schedule. Your health care provider will recommend vaccines for you based on your age, medical history, and lifestyle or other factors, such as travel or where you work.   What tests do I need? Blood tests  Lipid and cholesterol levels. These may be checked every 5 years, or more often depending on your overall health.  Hepatitis C test.  Hepatitis B test. Screening  Lung cancer screening. You may have this screening every year starting at age 78 if you have a 30-pack-year history of smoking and currently smoke or have quit within  the past 15 years.  Colorectal cancer screening. ? All adults should have this screening starting at age 78 and continuing until age 78. ? Your health care provider may recommend screening at age 78 if you are at increased risk. ? You will have tests every 1-10 years, depending on your results and the type of screening test.  Diabetes screening. ? This is done by checking your blood sugar (glucose) after you have not eaten for a while (fasting). ? You may have this done every 1-3 years.  Mammogram. ? This may be done every 1-2 years. ? Talk with your health care provider about how often you should have regular mammograms.  Abdominal aortic aneurysm (AAA) screening. You may need this if you are a current or former smoker.  BRCA-related cancer screening. This may be done if you have a family history of breast, ovarian, tubal, or peritoneal cancers. Other tests  STD (sexually transmitted disease) testing, if you are at risk.  Bone density scan. This is done to screen for osteoporosis. You may have this done starting at age 78. Talk with your health care provider about your test results, treatment options, and if necessary, the need for more tests. Follow these instructions at home: Eating and drinking  Eat a diet that includes fresh fruits and vegetables, whole grains, lean protein, and low-fat dairy products. Limit your intake of foods with high amounts of sugar, saturated fats, and salt.  Take vitamin and mineral supplements as recommended by your health care provider.  Do not drink alcohol if your health care provider tells you not to drink.  If you drink alcohol: ? Limit how much you have to 0-1 drink a day. ? Be aware of how much alcohol is in your drink. In the [place] one drink equals one 12 oz bottle of beer (355 mL), one 5 oz glass of wine (148 mL), or one 1 oz glass of hard liquor (44 mL).   Lifestyle  Take daily care of your teeth and gums. Brush your teeth every morning  and night with fluoride toothpaste. Floss one time each day.  Stay active. Exercise for at least 30 minutes 5 or more days each week.  Do not use any products that contain nicotine or tobacco, such as cigarettes, e-cigarettes, and chewing tobacco. If you need help quitting, ask your health care provider.  Do not use drugs.  If you are sexually active, practice safe sex. Use a condom or other form of protection in order to prevent STIs (sexually transmitted infections).  Talk with your health care provider about taking a low-dose aspirin or statin.  Find healthy ways to cope with stress, such as: ? Meditation, yoga, or listening to music. ? Journaling. ? Talking to a trusted person. ? Spending time with friends and family. Safety  Always wear your seat belt while driving or riding in a vehicle.  Do not drive: ? If you have been drinking alcohol. Do not ride with someone who has been drinking. ? When you are tired or distracted. ? While texting.  Wear a helmet and other protective equipment during sports activities.  If you have firearms in your house, make sure you follow all gun safety procedures. What's next?  Visit your health care provider once a year for an annual wellness visit.  Ask your health care provider how often you should have your eyes and teeth checked.  Stay up to date on all vaccines. This information is not intended to replace advice given to you by your health care provider. Make sure you discuss any questions you have with your health care provider. Document Revised: 08/10/2020 Document Reviewed: 08/14/2018 Elsevier Patient Education  [place].    Back Exercises These exercises help to make your trunk and back strong. They also help to keep the lower back flexible. Doing these exercises can help to prevent back pain or lessen existing pain. If you have back pain, try to do these exercises 2-3 times each day or as told by your doctor. As you  get better, do the exercises once each day. Repeat the exercises more often as told by your doctor. To stop back pain from coming back, do the exercises once each day, or as told by your doctor. Exercises Single knee to chest Do these steps 3-5 times in a row for each leg: Lie on your back on a firm bed or the floor with your legs stretched out. Bring one knee to your chest. Grab your knee or thigh with both hands and hold them it in place. Pull on your knee until you feel a gentle stretch in your lower back or buttocks. Keep doing the stretch for 10-30 seconds. Slowly let go of your leg and straighten it. Pelvic tilt Do these steps 5-10 times in a row: Lie on your back on a firm bed or the floor with your legs stretched out. Bend your knees so they point up to the ceiling. Your feet should be flat on the floor. Tighten your lower belly (abdomen) muscles to press your lower back against the floor. This will make  your tailbone point up to the ceiling instead of pointing down to your feet or the floor. Stay in this position for 5-10 seconds while you gently tighten your muscles and breathe evenly. Cat-cow Do these steps until your lower back bends more easily: Get on your hands and knees on a firm surface. Keep your hands under your shoulders, and keep your knees under your hips. You may put padding under your knees. Let your head hang down toward your chest. Tighten (contract) the muscles in your belly. Point your tailbone toward the floor so your lower back becomes rounded like the back of a cat. Stay in this position for 5 seconds. Slowly lift your head. Let the muscles of your belly relax. Point your tailbone up toward the ceiling so your back forms a sagging arch like the back of a cow. Stay in this position for 5 seconds.   Press-ups Do these steps 5-10 times in a row: Lie on your belly (face-down) on the floor. Place your hands near your head, about shoulder-width apart. While you  keep your back relaxed and keep your hips on the floor, slowly straighten your arms to raise the top half of your body and lift your shoulders. Do not use your back muscles. You may change where you place your hands in order to make yourself more comfortable. Stay in this position for 5 seconds. Slowly return to lying flat on the floor.   Bridges Do these steps 10 times in a row: Lie on your back on a firm surface. Bend your knees so they point up to the ceiling. Your feet should be flat on the floor. Your arms should be flat at your sides, next to your body. Tighten your butt muscles and lift your butt off the floor until your waist is almost as high as your knees. If you do not feel the muscles working in your butt and the back of your thighs, slide your feet 1-2 inches farther away from your butt. Stay in this position for 3-5 seconds. Slowly lower your butt to the floor, and let your butt muscles relax. If this exercise is too easy, try doing it with your arms crossed over your chest.   Belly crunches Do these steps 5-10 times in a row: Lie on your back on a firm bed or the floor with your legs stretched out. Bend your knees so they point up to the ceiling. Your feet should be flat on the floor. Cross your arms over your chest. Tip your chin a little bit toward your chest but do not bend your neck. Tighten your belly muscles and slowly raise your chest just enough to lift your shoulder blades a tiny bit off of the floor. Avoid raising your body higher than that, because it can put too much stress on your low back. Slowly lower your chest and your head to the floor. Back lifts Do these steps 5-10 times in a row: Lie on your belly (face-down) with your arms at your sides, and rest your forehead on the floor. Tighten the muscles in your legs and your butt. Slowly lift your chest off of the floor while you keep your hips on the floor. Keep the back of your head in line with the curve in your  back. Look at the floor while you do this. Stay in this position for 3-5 seconds. Slowly lower your chest and your face to the floor. Contact a doctor if: Your back pain gets a lot  worse when you do an exercise. Your back pain does not get better 2 hours after you exercise. If you have any of these problems, stop doing the exercises. Do not do them again unless your doctor says it is okay. Get help right away if: You have sudden, very bad back pain. If this happens, stop doing the exercises. Do not do them again unless your doctor says it is okay. This information is not intended to replace advice given to you by your health care provider. Make sure you discuss any questions you have with your health care provider. Document Revised: 05/15/2018 Document Reviewed: 05/15/2018 Elsevier Patient Education  2021 [employer].

## 2021-01-12 NOTE — Assessment & Plan Note (Signed)
Previously well controlled Continue Synthroid at current dose  Recheck TSH and adjust Synthroid as indicated   

## 2021-01-13 LAB — COMPREHENSIVE METABOLIC PANEL
ALT: 17 IU/L (ref 0–32)
AST: 20 IU/L (ref 0–40)
Albumin/Globulin Ratio: 2 (ref 1.2–2.2)
Albumin: 4.2 g/dL (ref 3.7–4.7)
Alkaline Phosphatase: 122 IU/L — ABNORMAL HIGH (ref 44–121)
BUN/Creatinine Ratio: 14 (ref 12–28)
BUN: 11 mg/dL (ref 8–27)
Bilirubin Total: 0.4 mg/dL (ref 0.0–1.2)
CO2: 25 mmol/L (ref 20–29)
Calcium: 9.2 mg/dL (ref 8.7–10.3)
Chloride: 100 mmol/L (ref 96–106)
Creatinine, Ser: 0.79 mg/dL (ref 0.57–1.00)
Globulin, Total: 2.1 g/dL (ref 1.5–4.5)
Glucose: 94 mg/dL (ref 65–99)
Potassium: 4.2 mmol/L (ref 3.5–5.2)
Sodium: 139 mmol/L (ref 134–144)
Total Protein: 6.3 g/dL (ref 6.0–8.5)
eGFR: 77 mL/min/{1.73_m2} (ref >59)

## 2021-01-13 LAB — VITAMIN D 25 HYDROXY (VIT D DEFICIENCY, FRACTURES): Vit D, 25-Hydroxy: 35.4 ng/mL (ref 30.0–100.0)

## 2021-01-13 LAB — LIPID PANEL
Chol/HDL Ratio: 3.1 ratio (ref 0.0–4.4)
Cholesterol, Total: 156 mg/dL (ref 100–199)
HDL: 51 mg/dL
LDL Chol Calc (NIH): 81 mg/dL (ref 0–99)
Triglycerides: 139 mg/dL (ref 0–149)
VLDL Cholesterol Cal: 24 mg/dL (ref 5–40)

## 2021-01-13 LAB — HEPATITIS C ANTIBODY: Hep C Virus Ab: <0.1 {s_co_ratio} (ref 0.0–0.9)

## 2021-01-13 LAB — TSH: TSH: 2.15 u[IU]/mL (ref 0.450–4.500)

## 2021-01-18 ENCOUNTER — Ambulatory Visit: Payer: Medicare Other

## 2021-01-23 ENCOUNTER — Telehealth: Payer: Self-pay

## 2021-01-23 NOTE — Telephone Encounter (Signed)
Copied from [place] [id number]. Topic: General - Other >> Jan 23, 2021  3:24 PM Leward Quan A wrote: Reason for CRM: Patient called in to inquire of Dr B about getting booster # 4 asking if she can get it here at the clinic. Please call patient   Ph# [phone]

## 2021-01-24 NOTE — Telephone Encounter (Signed)
I do recommend booster #4 to people 50 and over.  We can schedule her on the COVID-vaccine schedule whenever there is an opening

## 2021-01-25 ENCOUNTER — Ambulatory Visit: Payer: Medicare Other

## 2021-02-23 ENCOUNTER — Other Ambulatory Visit: Payer: Self-pay | Admitting: Family Medicine

## 2021-03-08 DIAGNOSIS — F331 Major depressive disorder, recurrent, moderate: Secondary | ICD-10-CM | POA: Diagnosis not present

## 2021-03-08 DIAGNOSIS — F419 Anxiety disorder, unspecified: Secondary | ICD-10-CM | POA: Diagnosis not present

## 2021-03-12 ENCOUNTER — Other Ambulatory Visit: Payer: Self-pay | Admitting: Family Medicine

## 2021-03-12 NOTE — Telephone Encounter (Signed)
Requested Prescriptions  Pending Prescriptions Disp Refills  . pantoprazole (PROTONIX) 40 MG tablet [Pharmacy Med Name: PANTOPRAZOLE SOD DR 40 MG TAB] 180 tablet 1    Sig: TAKE 1 TABLET (40 MG TOTAL) BY MOUTH 2 (TWO) TIMES DAILY BEFORE A MEAL.     Gastroenterology: Proton Pump Inhibitors Passed - 03/12/2021 12:45 AM      Passed - Valid encounter within last 12 months    Recent Outpatient Visits          1 month ago Encounter for annual wellness visit (AWV) in Medicare patient   [place], Dionne Bucy, MD   10 months ago Acquired hypothyroidism   [facility], Dionne Bucy, MD   1 year ago Acquired hypothyroidism   [facility] [place], Dionne Bucy, MD   1 year ago Acquired hypothyroidism   [facility] [place], Dionne Bucy, MD   2 years ago Hypercholesteremia   [facility] [place], Dionne Bucy, MD

## 2021-04-14 ENCOUNTER — Ambulatory Visit
Admission: RE | Admit: 2021-04-14 | Discharge: 2021-04-14 | Disposition: A | Discharge status: Home / Self Care | Payer: Medicare Other | Source: Ambulatory Visit | Attending: Family Medicine | Admitting: Family Medicine

## 2021-04-14 ENCOUNTER — Other Ambulatory Visit: Payer: Self-pay

## 2021-04-14 ENCOUNTER — Other Ambulatory Visit: Payer: Self-pay | Admitting: Family Medicine

## 2021-04-14 DIAGNOSIS — Z1231 Encounter for screening mammogram for malignant neoplasm of breast: Secondary | ICD-10-CM

## 2021-04-19 ENCOUNTER — Telehealth: Payer: Self-pay

## 2021-04-19 DIAGNOSIS — F419 Anxiety disorder, unspecified: Secondary | ICD-10-CM | POA: Diagnosis not present

## 2021-04-19 DIAGNOSIS — F331 Major depressive disorder, recurrent, moderate: Secondary | ICD-10-CM | POA: Diagnosis not present

## 2021-04-19 NOTE — Telephone Encounter (Signed)
Copied from [place] [id number]. Topic: General - Other >> Apr 19, 2021 11:42 AM Tessa Lerner A wrote: Reason for CRM: Patient has had a pea sized "lump" on the right side of their neck for roughly three weeks  The patient has called to request orders for an ultrasound or scan of the lump   The lump is not sensitive or painful and is not very close to the skin  The patient has declined to schedule an appt until orders for a scan have been submitted  Please contact further when possible

## 2021-04-19 NOTE — Telephone Encounter (Signed)
Please advise, patient was last seen in office by you on 01/12/21, upon physical exam it was not mentioned this finding so this is a new problem. Since patient has not been evaluated for this problem I would advise and office visit and advised by Joseline Rosas as below but patient has declined. Please review message below and advise. KW

## 2021-04-20 NOTE — Telephone Encounter (Signed)
You can use slots from the ok for 20 min visits if there are any

## 2021-04-20 NOTE — Telephone Encounter (Signed)
Lmtcb to schedule appt.

## 2021-04-20 NOTE — Telephone Encounter (Signed)
Patient was advised she would like to be seen by you as soon as possible, there was no openings today with any provider. Tomorrow in office will be you and Tally Joe FNP, since Daneil Dan is booked would you be willing to open your 11:20 time slot for tomorrow? KW

## 2021-04-20 NOTE — Telephone Encounter (Signed)
Agree that she needs to be evaluated before ordering imaging

## 2021-04-24 ENCOUNTER — Encounter: Payer: Self-pay | Admitting: Family Medicine

## 2021-04-24 ENCOUNTER — Other Ambulatory Visit: Payer: Self-pay

## 2021-04-24 ENCOUNTER — Ambulatory Visit (INDEPENDENT_AMBULATORY_CARE_PROVIDER_SITE_OTHER): Payer: Medicare Other | Admitting: Family Medicine

## 2021-04-24 VITALS — BP 106/55 | HR 75 | Temp 98.4°F | Resp 16 | Wt 187.0 lb

## 2021-04-24 DIAGNOSIS — R591 Generalized enlarged lymph nodes: Secondary | ICD-10-CM | POA: Diagnosis not present

## 2021-04-24 NOTE — Progress Notes (Signed)
Established patient visit   Patient: Nichole [name]   DOB: [birth year]   80 y.o. Female  MRN: [id number] Visit Date: 04/24/2021  Today's healthcare provider: Lavon Paganini, MD   Chief Complaint  Patient presents with   Mass   Subjective    HPI   R neck mass - Pt. states that she noticed new-onset neck mass 10 days ago; reports that the mass has gotten "smaller and harder" over the past week - Denies pain, fever, night sweats, dysphagia, hoarseness, & unexpected weight loss - Denies known recent infections - History of thyroidectomy in 1989 & head/neck radiation in 1949 - Denies tobacco & alcohol use - Endorses chronic fatigue, which she attributes to fibromyalgia     Medications: Outpatient Medications Prior to Visit  Medication Sig   atorvastatin (LIPITOR) 40 MG tablet TAKE 1 TABLET (40 MG TOTAL) BY MOUTH DAILY AT 6 PM.   budesonide (PULMICORT FLEXHALER) 180 MCG/ACT inhaler Inhale 2 puffs into the lungs daily.   Budesonide-Formoterol Fumarate (SYMBICORT IN) Inhale into the lungs as needed.   busPIRone (BUSPAR) 15 MG tablet Take by mouth. 30 mg in the morning and 15 mg in the evening   clopidogrel (PLAVIX) 75 MG tablet TAKE 1 TABLET (75 MG TOTAL) BY MOUTH DAILY. PLEASE SCHEDULE OFFICE VISIT BEFORE ANY FUTURE REFILLS   DULoxetine (CYMBALTA) 60 MG capsule TAKE 1 CAPSULE BY MOUTH EVERY DAY   gabapentin (NEURONTIN) 600 MG tablet TAKE 2 TABLETS (1,200 MG TOTAL) BY MOUTH 2 (TWO) TIMES DAILY.   levalbuterol (XOPENEX HFA) 45 MCG/ACT inhaler INHALE 2 PUFFS INTO THE LUNGS EVERY 4 (FOUR) HOURS AS NEEDED FOR WHEEZING OR SHORTNESS OF BREATH.   levothyroxine (SYNTHROID) 75 MCG tablet Take one tablet on an empty stomach two days a week.   levothyroxine (SYNTHROID) 88 MCG tablet TAKE ONE TABLET ON AN EMPTY STOMACH FIVE DAYS A WEEK.   Naproxen Sodium 220 MG CAPS Take 220 mg by mouth 2 (two) times daily.   pantoprazole (PROTONIX) 40 MG tablet TAKE 1 TABLET (40 MG TOTAL) BY MOUTH 2  (TWO) TIMES DAILY BEFORE A MEAL.   polyethylene glycol powder (GLYCOLAX/MIRALAX) powder Take 255 g by mouth once.   temazepam (RESTORIL) 30 MG capsule Take 15 mg by mouth at bedtime.   No facility-administered medications prior to visit.    Review of Systems  Constitutional:  Positive for fatigue. Negative for activity change, appetite change, chills, diaphoresis, fever and unexpected weight change.  HENT:  Positive for postnasal drip. Negative for congestion, facial swelling, mouth sores, sore throat, trouble swallowing and voice change.        R neck mass  Eyes: Negative.   Respiratory: Negative.  Negative for chest tightness and shortness of breath.   Cardiovascular: Negative.  Negative for chest pain and leg swelling.  Gastrointestinal: Negative.   Endocrine: Negative.   Genitourinary: Negative.   Musculoskeletal: Negative.  Negative for neck pain and neck stiffness.  Skin: Negative.   Allergic/Immunologic: Positive for environmental allergies.  Neurological: Negative.   Psychiatric/Behavioral:  The patient is nervous/anxious.       Objective    BP (!) 106/55 (BP Location: Left Arm, Patient Position: Sitting, Cuff Size: Large)   Pulse 75   Temp 98.4 F (36.9 C) (Oral)   Resp 16   Wt 187 lb (84.8 kg)   BMI 34.76 kg/m     Physical Exam Constitutional:      General: She is not in acute distress.  Appearance: Normal appearance. She is normal weight. She is not toxic-appearing.  HENT:     Head: Normocephalic and atraumatic.     Right Ear: External ear normal.     Left Ear: External ear normal.  Eyes:     Conjunctiva/sclera: Conjunctivae normal.  Neck:     Trachea: Trachea and phonation normal.     Comments: ~1.5 mm rigid, slightly mobile lymph node in posterior chain of R neck Cardiovascular:     Rate and Rhythm: Normal rate and regular rhythm.     Pulses: Normal pulses.     Heart sounds: Normal heart sounds.  Pulmonary:     Effort: Pulmonary effort is normal.      Breath sounds: Normal breath sounds.  Musculoskeletal:     Cervical back: No rigidity or tenderness.  Lymphadenopathy:     Cervical: Cervical adenopathy present.     Right cervical: Posterior cervical adenopathy present. No superficial or deep cervical adenopathy.    Left cervical: No superficial, deep or posterior cervical adenopathy.  Skin:    General: Skin is warm and dry.  Neurological:     Mental Status: She is alert.  Psychiatric:        Behavior: Behavior normal.        Thought Content: Thought content normal.     No results found for any visits on 04/24/21.  Assessment & Plan     Problem List Items Addressed This Visit   None Visit Diagnoses     Lymphadenopathy of head and neck    -  Primary     - Acute problem   - Risk factors include history of head/neck radiation and family history of malignancy in first-degree relative   - Lower suspicion for malignancy due to size, location, mobility, and acute decrease in size over the past several days; no evidence of B symptoms concerning for malignancy   - Check CBC    - Ultrasound to rule out malignancy given pt. risk factors   - Will continue to monitor    Relevant Orders   CBC w/Diff/Platelet   US Soft Tissue Head/Neck (NON-THYROID)        Return if symptoms worsen or fail to improve.       Percell Locus, MS3    Patient seen along with MS3 student Percell Locus. I personally evaluated this patient along with the student, and verified all aspects of the history, physical exam, and medical decision making as documented by the student. I agree with the student's documentation and have made all necessary edits.  Eldrick Penick, Dionne Bucy, MD, MPH [facility]

## 2021-04-25 LAB — CBC WITH DIFFERENTIAL/PLATELET
Basophils Absolute: 0 10*3/uL (ref 0.0–0.2)
Basos: 1 %
EOS (ABSOLUTE): 0.3 10*3/uL (ref 0.0–0.4)
Eos: 5 %
Hematocrit: 32.8 % — ABNORMAL LOW (ref 34.0–46.6)
Hemoglobin: 11 g/dL — ABNORMAL LOW (ref 11.1–15.9)
Immature Grans (Abs): 0 10*3/uL (ref 0.0–0.1)
Immature Granulocytes: 0 %
Lymphocytes Absolute: 1.5 10*3/uL (ref 0.7–3.1)
Lymphs: 28 %
MCH: 28.6 pg (ref 26.6–33.0)
MCHC: 33.5 g/dL (ref 31.5–35.7)
MCV: 85 fL (ref 79–97)
Monocytes Absolute: 0.4 10*3/uL (ref 0.1–0.9)
Monocytes: 7 %
Neutrophils Absolute: 3.3 10*3/uL (ref 1.4–7.0)
Neutrophils: 59 %
Platelets: 248 10*3/uL (ref 150–450)
RBC: 3.84 x10E6/uL (ref 3.77–5.28)
RDW: 13.3 % (ref 11.7–15.4)
WBC: 5.5 10*3/uL (ref 3.4–10.8)

## 2021-05-03 DIAGNOSIS — Z808 Family history of malignant neoplasm of other organs or systems: Secondary | ICD-10-CM | POA: Diagnosis not present

## 2021-05-03 DIAGNOSIS — C44319 Basal cell carcinoma of skin of other parts of face: Secondary | ICD-10-CM | POA: Diagnosis not present

## 2021-05-03 DIAGNOSIS — L821 Other seborrheic keratosis: Secondary | ICD-10-CM | POA: Diagnosis not present

## 2021-05-03 DIAGNOSIS — Z85828 Personal history of other malignant neoplasm of skin: Secondary | ICD-10-CM | POA: Diagnosis not present

## 2021-05-03 DIAGNOSIS — D492 Neoplasm of unspecified behavior of bone, soft tissue, and skin: Secondary | ICD-10-CM | POA: Diagnosis not present

## 2021-05-05 ENCOUNTER — Ambulatory Visit: Payer: Medicare Other

## 2021-05-09 DIAGNOSIS — F331 Major depressive disorder, recurrent, moderate: Secondary | ICD-10-CM | POA: Diagnosis not present

## 2021-05-21 DIAGNOSIS — L259 Unspecified contact dermatitis, unspecified cause: Secondary | ICD-10-CM | POA: Diagnosis not present

## 2021-06-06 DIAGNOSIS — M533 Sacrococcygeal disorders, not elsewhere classified: Secondary | ICD-10-CM | POA: Diagnosis not present

## 2021-06-06 DIAGNOSIS — M25562 Pain in left knee: Secondary | ICD-10-CM | POA: Diagnosis not present

## 2021-06-07 DIAGNOSIS — H532 Diplopia: Secondary | ICD-10-CM | POA: Diagnosis not present

## 2021-06-12 DIAGNOSIS — L814 Other melanin hyperpigmentation: Secondary | ICD-10-CM | POA: Diagnosis not present

## 2021-06-12 DIAGNOSIS — C44319 Basal cell carcinoma of skin of other parts of face: Secondary | ICD-10-CM | POA: Diagnosis not present

## 2021-06-12 DIAGNOSIS — L578 Other skin changes due to chronic exposure to nonionizing radiation: Secondary | ICD-10-CM | POA: Diagnosis not present

## 2021-06-14 DIAGNOSIS — F419 Anxiety disorder, unspecified: Secondary | ICD-10-CM | POA: Diagnosis not present

## 2021-06-14 DIAGNOSIS — F331 Major depressive disorder, recurrent, moderate: Secondary | ICD-10-CM | POA: Diagnosis not present
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
## 2021-06-27 ENCOUNTER — Other Ambulatory Visit: Payer: Self-pay

## 2021-06-27 ENCOUNTER — Encounter: Payer: Self-pay | Admitting: Neurology

## 2021-06-27 ENCOUNTER — Ambulatory Visit (INDEPENDENT_AMBULATORY_CARE_PROVIDER_SITE_OTHER): Payer: Medicare Other | Admitting: Neurology

## 2021-06-27 VITALS — BP 191/77 | HR 79 | Ht 62.0 in | Wt 172.8 lb

## 2021-06-27 DIAGNOSIS — G43009 Migraine without aura, not intractable, without status migrainosus: Secondary | ICD-10-CM

## 2021-06-27 DIAGNOSIS — R030 Elevated blood-pressure reading, without diagnosis of hypertension: Secondary | ICD-10-CM | POA: Diagnosis not present

## 2021-06-27 DIAGNOSIS — G4485 Primary stabbing headache: Secondary | ICD-10-CM | POA: Diagnosis not present

## 2021-06-27 MED ORDER — DULOXETINE HCL 60 MG PO CPEP: ORAL_CAPSULE | ORAL | 3 refills | Status: DC

## 2021-06-27 NOTE — Patient Instructions (Signed)
Refilled Cymbalta

## 2021-07-06 ENCOUNTER — Telehealth: Payer: Self-pay | Admitting: Family Medicine

## 2021-07-06 NOTE — Telephone Encounter (Signed)
Referral Request - Has patient seen PCP for this complaint? yes *If NO, is insurance requiring patient see PCP for this issue before PCP can refer them? 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

## 2021-07-06 NOTE — Telephone Encounter (Signed)
Ok to send referral as requested 

## 2021-07-07 ENCOUNTER — Other Ambulatory Visit: Payer: Self-pay

## 2021-07-07 DIAGNOSIS — M810 Age-related osteoporosis without current pathological fracture: Secondary | ICD-10-CM

## 2021-07-11 DIAGNOSIS — F331 Major depressive disorder, recurrent, moderate: Secondary | ICD-10-CM | POA: Diagnosis not present

## 2021-07-11 DIAGNOSIS — F419 Anxiety disorder, unspecified: Secondary | ICD-10-CM | POA: Diagnosis not present

## 2021-07-21 ENCOUNTER — Ambulatory Visit: Payer: Self-pay | Admitting: *Deleted

## 2021-07-21 DIAGNOSIS — B356 Tinea cruris: Secondary | ICD-10-CM | POA: Diagnosis not present

## 2021-07-21 NOTE — Telephone Encounter (Signed)
Patient states she has a burning rash in groin area and above. Patient advised UC for evaluation- no open appointment in office.

## 2021-07-21 NOTE — Telephone Encounter (Signed)
Reason for Disposition  [1] SEVERE pain AND [2] not improved 2 hours after pain medicine  Answer Assessment - Initial Assessment Questions 1. APPEARANCE of RASH: "Describe the rash."      Red area 2. LOCATION: "Where is the rash located?"      Groin area and above pelvic area 3. NUMBER: "How many spots are there?"      Raw area in groin and lower abdomen 4. SIZE: "How big are the spots?" (Inches, centimeters or compare to size of a coin)      large 5. ONSET: "When did the rash start?"      Yesterday afternoon 6. ITCHING: "Does the rash itch?" If Yes, ask: "How bad is the itch?"  (Scale 0-10; or none, mild, moderate, severe)     no 7. PAIN: "Does the rash hurt?" If Yes, ask: "How bad is the pain?"  (Scale 0-10; or none, mild, moderate, severe)    - NONE (0): no pain    - MILD (1-3): doesn't interfere with normal activities     - MODERATE (4-7): interferes with normal activities or awakens from sleep     - SEVERE (8-10): excruciating pain, unable to do any normal activities     burning 8. OTHER SYMPTOMS: "Do you have any other symptoms?" (e.g., fever)     no 9. PREGNANCY: "Is there any chance you are pregnant?" "When was your last menstrual period?"     na  Protocols used: Rash or Redness - Localized-A-AH, Vulvar Symptoms-A-AH

## 2021-07-24 ENCOUNTER — Telehealth: Payer: Self-pay

## 2021-07-24 NOTE — Telephone Encounter (Signed)
Copied from [place] [id number]. Topic: General - Inquiry >> Jul 24, 2021  3:52 PM Greggory Keen D wrote: Reason for CRM: Pt called asking to speak to Dr. Sharmaine Base nurse.  She would not disclose what she need.  CB#  [phone]

## 2021-07-25 ENCOUNTER — Other Ambulatory Visit: Payer: Self-pay

## 2021-07-25 MED ORDER — LEVOTHYROXINE SODIUM 88 MCG PO TABS: ORAL_TABLET | ORAL | 1 refills | Status: DC

## 2021-07-25 NOTE — Telephone Encounter (Signed)
Patients last colonoscopy was done 09/28/15 by dr. Jamal Collin. Per note patient is to recheck is 5 years. Patient reports Dr. Jamal Collin has retired, and wants Dr. B to refer elsewhere.

## 2021-07-31 ENCOUNTER — Other Ambulatory Visit: Payer: Self-pay

## 2021-07-31 DIAGNOSIS — Z1211 Encounter for screening for malignant neoplasm of colon: Secondary | ICD-10-CM

## 2021-07-31 NOTE — Telephone Encounter (Signed)
Ok to refer to [facility] for colon cancer screening

## 2021-08-07 DIAGNOSIS — Z23 Encounter for immunization: Secondary | ICD-10-CM | POA: Diagnosis not present

## 2021-08-24 DIAGNOSIS — H209 Unspecified iridocyclitis: Secondary | ICD-10-CM | POA: Diagnosis not present

## 2021-08-24 DIAGNOSIS — H029 Unspecified disorder of eyelid: Secondary | ICD-10-CM | POA: Diagnosis not present

## 2021-08-24 DIAGNOSIS — C441192 Basal cell carcinoma of skin of left lower eyelid, including canthus: Secondary | ICD-10-CM | POA: Diagnosis not present

## 2021-09-07 ENCOUNTER — Other Ambulatory Visit: Payer: Self-pay | Admitting: Family Medicine

## 2021-09-07 DIAGNOSIS — F331 Major depressive disorder, recurrent, moderate: Secondary | ICD-10-CM | POA: Diagnosis not present

## 2021-09-21 ENCOUNTER — Other Ambulatory Visit: Payer: Self-pay | Admitting: Family Medicine

## 2021-09-21 NOTE — Telephone Encounter (Signed)
Requested Prescriptions  Pending Prescriptions Disp Refills   pantoprazole (PROTONIX) 40 MG tablet [Pharmacy Med Name: PANTOPRAZOLE SOD DR 40 MG TAB] 180 tablet 1    Sig: TAKE 1 TABLET (40 MG TOTAL) BY MOUTH 2 (TWO) TIMES DAILY BEFORE A MEAL.     Gastroenterology: Proton Pump Inhibitors Passed - 09/21/2021  3:12 AM      Passed - Valid encounter within last 12 months    Recent Outpatient Visits          5 months ago Lymphadenopathy of head and neck   [facility] [place], Dionne Bucy, MD   8 months ago Encounter for annual wellness visit (AWV) in Medicare patient   [facility] [place], Dionne Bucy, MD   1 year ago Acquired hypothyroidism   [facility] [place], Dionne Bucy, MD   1 year ago Acquired hypothyroidism   [facility] [place], Dionne Bucy, MD   2 years ago Acquired hypothyroidism   [facility] [place], Dionne Bucy, MD

## 2021-09-28 ENCOUNTER — Other Ambulatory Visit: Payer: Self-pay

## 2021-09-28 ENCOUNTER — Ambulatory Visit: Payer: Medicare Other | Admitting: Gastroenterology

## 2021-09-28 ENCOUNTER — Telehealth: Payer: Self-pay

## 2021-09-28 DIAGNOSIS — Z800 Family history of malignant neoplasm of digestive organs: Secondary | ICD-10-CM

## 2021-09-28 MED ORDER — NA SULFATE-K SULFATE-MG SULF 17.5-3.13-1.6 GM/177ML PO SOLN: 1.0000 | Freq: Once | ORAL | 0 refills | Status: AC

## 2021-09-28 NOTE — Telephone Encounter (Signed)
PATIENT WAS SCHEDULED AT VISIT
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
## 2021-09-29 ENCOUNTER — Telehealth: Payer: Self-pay

## 2021-09-29 NOTE — Telephone Encounter (Signed)
Blood thinner request faxed to Dr Brita Romp for Plavix

## 2021-10-01 ENCOUNTER — Other Ambulatory Visit: Payer: Self-pay | Admitting: Family Medicine

## 2021-10-01 NOTE — Telephone Encounter (Signed)
Requested Prescriptions  Pending Prescriptions Disp Refills   atorvastatin (LIPITOR) 40 MG tablet [Pharmacy Med Name: ATORVASTATIN 40 MG TABLET] 90 tablet 1    Sig: TAKE 1 TABLET BY MOUTH DAILY AT 6 PM.     Cardiovascular:  Antilipid - Statins Passed - 10/01/2021 12:50 AM      Passed - Total Cholesterol in normal range and within 360 days    Cholesterol, Total  Date Value Ref Range Status  01/12/2021 156 100 - 199 mg/dL Final         Passed - LDL in normal range and within 360 days    LDL Chol Calc (NIH)  Date Value Ref Range Status  01/12/2021 81 0 - 99 mg/dL Final         Passed - HDL in normal range and within 360 days    HDL  Date Value Ref Range Status  01/12/2021 51 >39 mg/dL Final         Passed - Triglycerides in normal range and within 360 days    Triglycerides  Date Value Ref Range Status  01/12/2021 139 0 - 149 mg/dL Final         Passed - Patient is not pregnant      Passed - Valid encounter within last 12 months    Recent Outpatient Visits          5 months ago Lymphadenopathy of head and neck   [facility] [place], Dionne Bucy, MD   8 months ago Encounter for annual wellness visit (AWV) in Medicare patient   [facility] [place], Dionne Bucy, MD   1 year ago Acquired hypothyroidism   [facility] [place], Dionne Bucy, MD   1 year ago Acquired hypothyroidism   [place], Dionne Bucy, MD   2 years ago Acquired hypothyroidism   [facility] [place], Dionne Bucy, MD

## 2021-10-24 NOTE — Telephone Encounter (Signed)
Request faxed again x 2

## 2021-10-27 ENCOUNTER — Telehealth: Payer: Self-pay

## 2021-10-27 NOTE — Telephone Encounter (Signed)
Spoke to patient who reports she has been having cold symptoms for 4 weeks. Patient did not want to come in today as her daughter in the hospital and she is waiting on a call from the doctor who is attending her daughter. Patient scheduled for Monday. Patient advised if symptoms are worsening to head to UC if needed.

## 2021-10-27 NOTE — Telephone Encounter (Signed)
Copied from [place] [id number]. Topic: General - Other >> Oct 27, 2021 11:22 AM McGill, Nelva Bush wrote: Reason for CRM: Pt requesting a call back from a member of staff in the office would prefer to speak with a nurse. Pt wants to know if she needs to schedule an appointment. >> Oct 27, 2021 11:25 AM McGill, Nelva Bush wrote: Requested a call back today.

## 2021-10-30 ENCOUNTER — Other Ambulatory Visit: Payer: Self-pay

## 2021-10-30 ENCOUNTER — Encounter: Payer: Self-pay | Admitting: Family Medicine

## 2021-10-30 ENCOUNTER — Ambulatory Visit (INDEPENDENT_AMBULATORY_CARE_PROVIDER_SITE_OTHER): Payer: Medicare Other | Admitting: Family Medicine

## 2021-10-30 VITALS — BP 124/72 | HR 73 | Temp 97.6°F | Resp 16 | Wt 173.0 lb

## 2021-10-30 DIAGNOSIS — J301 Allergic rhinitis due to pollen: Secondary | ICD-10-CM | POA: Diagnosis not present

## 2021-10-30 NOTE — Progress Notes (Signed)
I,Sulibeya S Dimas,acting as a Education administrator for Lavon Paganini, MD.,have documented all relevant documentation on the behalf of Lavon Paganini, MD,as directed by  Lavon Paganini, MD while in the presence of Lavon Paganini, MD.  Established patient visit   Patient: Nichole [name]   DOB: 19-Aug-1941   81 y.o. Female  MRN: [id number] Visit Date: 10/30/2021  Today's healthcare provider: Lavon Paganini, MD   Chief Complaint  Patient presents with   URI   Subjective    HPI  Upper respiratory symptoms She complains of post nasal drip, productive cough with  yellow colored sputum, sneezing, and wheezing.with no fever, chills, night sweats or weight loss. Onset of symptoms was a few weeks ago and gradually improving.She is drinking plenty of fluids.  Past history is significant for asthma. Patient is non-smoker. Patient C/O itching on top roof of mouth.   Waxing and waning Taking OTC Tylenol ---------------------------------------------------------------------------------------------------   Medications: Outpatient Medications Prior to Visit  Medication Sig   atorvastatin (LIPITOR) 40 MG tablet TAKE 1 TABLET BY MOUTH DAILY AT 6 PM.   busPIRone (BUSPAR) 15 MG tablet Take by mouth. 30 mg in the morning and 15 mg in the evening   clopidogrel (PLAVIX) 75 MG tablet TAKE 1 TABLET (75 MG TOTAL) BY MOUTH DAILY. PLEASE SCHEDULE OFFICE VISIT BEFORE ANY FUTURE REFILLS   DULoxetine (CYMBALTA) 60 MG capsule TAKE 1 CAPSULE BY MOUTH EVERY DAY   gabapentin (NEURONTIN) 600 MG tablet TAKE 2 TABLETS (1,200 MG TOTAL) BY MOUTH 2 (TWO) TIMES DAILY.   levalbuterol (XOPENEX HFA) 45 MCG/ACT inhaler INHALE 2 PUFFS INTO THE LUNGS EVERY 4 (FOUR) HOURS AS NEEDED FOR WHEEZING OR SHORTNESS OF BREATH.   levothyroxine (SYNTHROID) 75 MCG tablet Take one tablet on an empty stomach two days a week.   levothyroxine (SYNTHROID) 88 MCG tablet TAKE ONE TABLET ON AN EMPTY STOMACH FIVE DAYS A WEEK.   Naproxen  Sodium 220 MG CAPS Take 220 mg by mouth 2 (two) times daily.   pantoprazole (PROTONIX) 40 MG tablet TAKE 1 TABLET (40 MG TOTAL) BY MOUTH 2 (TWO) TIMES DAILY BEFORE A MEAL.   polyethylene glycol powder (GLYCOLAX/MIRALAX) powder Take 255 g by mouth once.   temazepam (RESTORIL) 30 MG capsule Take 15 mg by mouth at bedtime.   budesonide (PULMICORT FLEXHALER) 180 MCG/ACT inhaler Inhale 2 puffs into the lungs daily. (Patient not taking: Reported on 10/30/2021)   Budesonide-Formoterol Fumarate (SYMBICORT IN) Inhale into the lungs as needed. (Patient not taking: Reported on 10/30/2021)   No facility-administered medications prior to visit.    Review of Systems  Constitutional:  Negative for activity change, appetite change, chills and fatigue.  HENT:  Positive for ear pain, postnasal drip, sneezing and sore throat.   Respiratory:  Positive for cough, shortness of breath and wheezing.   Cardiovascular:  Negative for chest pain and palpitations.       Objective    BP 124/72 (BP Location: Left Arm, Patient Position: Sitting, Cuff Size: Large)    Pulse 73    Temp 97.6 F (36.4 C) (Temporal)    Resp 16    Wt 173 lb (78.5 kg)    SpO2 97%    BMI 31.64 kg/m  BP Readings from Last 3 Encounters:  10/30/21 124/72  06/27/21 (!) 191/77  04/24/21 (!) 106/55   Wt Readings from Last 3 Encounters:  10/30/21 173 lb (78.5 kg)  06/27/21 172 lb 12.8 oz (78.4 kg)  04/24/21 187 lb (84.8 kg)  Physical Exam Vitals reviewed.  Constitutional:      General: She is not in acute distress.    Appearance: Normal appearance. She is well-developed. She is not diaphoretic.  HENT:     Head: Normocephalic and atraumatic.     Right Ear: Tympanic membrane, ear canal and external ear normal.     Left Ear: Tympanic membrane, ear canal and external ear normal.     Nose: Congestion present.     Mouth/Throat:     Mouth: Mucous membranes are moist.     Pharynx: Oropharynx is clear. No oropharyngeal exudate.  Eyes:      General: No scleral icterus.    Conjunctiva/sclera: Conjunctivae normal.  Neck:     Thyroid: No thyromegaly.  Cardiovascular:     Rate and Rhythm: Normal rate and regular rhythm.     Pulses: Normal pulses.     Heart sounds: Normal heart sounds. No murmur heard. Pulmonary:     Effort: Pulmonary effort is normal. No respiratory distress.     Breath sounds: Normal breath sounds. No wheezing, rhonchi or rales.  Musculoskeletal:     Cervical back: Neck supple.     Right lower leg: No edema.     Left lower leg: No edema.  Lymphadenopathy:     Cervical: No cervical adenopathy.  Skin:    General: Skin is warm and dry.     Findings: No rash.  Neurological:     Mental Status: She is alert and oriented to person, place, and time. Mental status is at baseline.  Psychiatric:        Mood and Affect: Mood normal.        Behavior: Behavior normal.      No results found for any visits on 10/30/21.  Assessment & Plan     1. Seasonal allergic rhinitis due to pollen - recurrent problem - resume Zyrtec  - resume flonase - no infectious signs today - return precautions   Return if symptoms worsen or fail to improve.      I, Lavon Paganini, MD, have reviewed all documentation for this visit. The documentation on 10/30/21 for the exam, diagnosis, procedures, and orders are all accurate and complete.   Nova Evett, Dionne Bucy, MD, MPH [facility]

## 2021-11-01 DIAGNOSIS — M545 Low back pain, unspecified: Secondary | ICD-10-CM | POA: Diagnosis not present

## 2021-11-01 DIAGNOSIS — M25562 Pain in left knee: Secondary | ICD-10-CM | POA: Diagnosis not present

## 2021-11-02 ENCOUNTER — Telehealth: Payer: Self-pay

## 2021-11-02 NOTE — Telephone Encounter (Signed)
Called [facility] and was told to refax the clearance ATTN [place] and she will get to this for today. ? ?I spoke to pt and she states that she does not take medication until bedtime and she will hold until I give her a call back ?

## 2021-11-02 NOTE — Telephone Encounter (Signed)
We just received it today and it was completed and faxed back ?

## 2021-11-02 NOTE — Telephone Encounter (Signed)
Copied from [place] [id number]. Topic: General - Other ?>> Nov 02, 2021 10:12 AM Alanda Slim E wrote: ?Reason for CRM: [facility] called about a clearance form they have faxed over 6 times int eh last month through Epic/ please advise / she stated she will send this again today ?

## 2021-11-03 NOTE — Telephone Encounter (Signed)
Left message on voicemail confirming with pt to continue to hold Plavix until procedure and may resume 1 day after ?

## 2021-11-03 NOTE — Telephone Encounter (Signed)
Received fax today from Dr Nancy Nordmann office ? ?Pt must stop Plavix 5 days prior to procedure and restart 1 day after procedure ?

## 2021-11-06 ENCOUNTER — Telehealth: Payer: Self-pay

## 2021-11-06 NOTE — Telephone Encounter (Signed)
Returned patients call no answer left voicemail for a call back ?

## 2021-11-06 NOTE — Telephone Encounter (Signed)
Returned patients call no answer left voicemail  ?

## 2021-11-07 ENCOUNTER — Encounter: Admission: RE | Payer: Self-pay | Source: Home / Self Care

## 2021-11-07 ENCOUNTER — Ambulatory Visit: Admission: RE | Admit: 2021-11-07 | Payer: Medicare Other | Source: Home / Self Care | Admitting: Gastroenterology

## 2021-11-07 SURGERY — COLONOSCOPY WITH PROPOFOL
Anesthesia: General

## 2021-11-23 ENCOUNTER — Other Ambulatory Visit: Payer: Self-pay | Admitting: Family Medicine

## 2021-11-30 DIAGNOSIS — L988 Other specified disorders of the skin and subcutaneous tissue: Secondary | ICD-10-CM | POA: Diagnosis not present

## 2021-11-30 DIAGNOSIS — L814 Other melanin hyperpigmentation: Secondary | ICD-10-CM | POA: Diagnosis not present

## 2021-11-30 DIAGNOSIS — C441192 Basal cell carcinoma of skin of left lower eyelid, including canthus: Secondary | ICD-10-CM | POA: Diagnosis not present

## 2021-11-30 DIAGNOSIS — L578 Other skin changes due to chronic exposure to nonionizing radiation: Secondary | ICD-10-CM | POA: Diagnosis not present

## 2021-12-01 DIAGNOSIS — E785 Hyperlipidemia, unspecified: Secondary | ICD-10-CM | POA: Diagnosis not present

## 2021-12-01 DIAGNOSIS — C441192 Basal cell carcinoma of skin of left lower eyelid, including canthus: Secondary | ICD-10-CM | POA: Diagnosis not present

## 2021-12-01 DIAGNOSIS — E039 Hypothyroidism, unspecified: Secondary | ICD-10-CM | POA: Diagnosis not present

## 2021-12-01 DIAGNOSIS — F4310 Post-traumatic stress disorder, unspecified: Secondary | ICD-10-CM | POA: Diagnosis not present

## 2021-12-01 DIAGNOSIS — Z79890 Hormone replacement therapy (postmenopausal): Secondary | ICD-10-CM | POA: Diagnosis not present

## 2021-12-01 DIAGNOSIS — Z972 Presence of dental prosthetic device (complete) (partial): Secondary | ICD-10-CM | POA: Diagnosis not present

## 2021-12-01 DIAGNOSIS — Z79899 Other long term (current) drug therapy: Secondary | ICD-10-CM | POA: Diagnosis not present

## 2021-12-01 DIAGNOSIS — Z882 Allergy status to sulfonamides status: Secondary | ICD-10-CM | POA: Diagnosis not present

## 2021-12-01 DIAGNOSIS — Z6831 Body mass index (BMI) 31.0-31.9, adult: Secondary | ICD-10-CM | POA: Diagnosis not present

## 2021-12-01 DIAGNOSIS — Z880 Allergy status to penicillin: Secondary | ICD-10-CM | POA: Diagnosis not present

## 2021-12-01 DIAGNOSIS — M797 Fibromyalgia: Secondary | ICD-10-CM | POA: Diagnosis not present

## 2021-12-01 DIAGNOSIS — Z8673 Personal history of transient ischemic attack (TIA), and cerebral infarction without residual deficits: Secondary | ICD-10-CM | POA: Diagnosis not present

## 2021-12-01 DIAGNOSIS — F419 Anxiety disorder, unspecified: Secondary | ICD-10-CM | POA: Diagnosis not present

## 2021-12-01 DIAGNOSIS — Z87891 Personal history of nicotine dependence: Secondary | ICD-10-CM | POA: Diagnosis not present

## 2021-12-01 DIAGNOSIS — Z881 Allergy status to other antibiotic agents status: Secondary | ICD-10-CM | POA: Diagnosis not present

## 2021-12-01 DIAGNOSIS — Z888 Allergy status to other drugs, medicaments and biological substances status: Secondary | ICD-10-CM | POA: Diagnosis not present

## 2021-12-02 DIAGNOSIS — M797 Fibromyalgia: Secondary | ICD-10-CM | POA: Diagnosis not present

## 2021-12-02 DIAGNOSIS — F4310 Post-traumatic stress disorder, unspecified: Secondary | ICD-10-CM | POA: Diagnosis not present

## 2021-12-02 DIAGNOSIS — E039 Hypothyroidism, unspecified: Secondary | ICD-10-CM | POA: Diagnosis not present

## 2021-12-02 DIAGNOSIS — F419 Anxiety disorder, unspecified: Secondary | ICD-10-CM | POA: Diagnosis not present

## 2021-12-02 DIAGNOSIS — E785 Hyperlipidemia, unspecified: Secondary | ICD-10-CM | POA: Diagnosis not present

## 2021-12-02 DIAGNOSIS — Z6831 Body mass index (BMI) 31.0-31.9, adult: Secondary | ICD-10-CM | POA: Diagnosis not present

## 2021-12-02 DIAGNOSIS — C441192 Basal cell carcinoma of skin of left lower eyelid, including canthus: Secondary | ICD-10-CM | POA: Diagnosis not present

## 2021-12-25 DIAGNOSIS — C441192 Basal cell carcinoma of skin of left lower eyelid, including canthus: Secondary | ICD-10-CM | POA: Diagnosis not present

## 2022-01-01 ENCOUNTER — Telehealth: Payer: Self-pay

## 2022-01-01 NOTE — Telephone Encounter (Signed)
Copied from [place] [id number]. Topic: General - Inquiry ?>> Jan 01, 2022  8:40 AM McGill, Nelva Bush wrote: ?Reason for CRM: Lovena Le from [facility] ?Stated Bone density scan needed for pts appointment today at 10:15 AM . ? ?Fax- [phone] ?[phone] ?

## 2022-01-02 NOTE — Telephone Encounter (Signed)
Last DEXA <2 yrs ago (03/2020).  It is recommended in 2 yrs, so insurance may not cover right now. Ok to order, but she should know that. ?

## 2022-01-02 NOTE — Telephone Encounter (Signed)
Order not needed. Patient no showed to appt.  ?

## 2022-01-02 NOTE — Telephone Encounter (Signed)
NA, LMTCB advising as below.  ?

## 2022-01-10 DIAGNOSIS — C441192 Basal cell carcinoma of skin of left lower eyelid, including canthus: Secondary | ICD-10-CM | POA: Diagnosis not present

## 2022-01-10 DIAGNOSIS — Z85828 Personal history of other malignant neoplasm of skin: Secondary | ICD-10-CM | POA: Diagnosis not present

## 2022-01-10 DIAGNOSIS — D229 Melanocytic nevi, unspecified: Secondary | ICD-10-CM | POA: Diagnosis not present

## 2022-01-12 NOTE — Progress Notes (Signed)
 I,Sulibeya S Dimas,acting as a Neurosurgeon for Jon Eva, MD.,have documented all relevant documentation on the behalf of Jon Eva, MD,as directed by  Jon Eva, MD while in the presence of Jon Eva, MD.   Annual Wellness Visit     Patient: Nichole [name], Female    DOB: Aug 31, 1941, 81 y.o.   MRN: [id number] Visit Date: 01/15/2022  Today's Provider: Jon Eva, MD   Chief Complaint  Patient presents with   Medicare Wellness   Subjective    Nichole [name] is a 81 y.o. female who presents today for her Annual Wellness Visit. She reports consuming a general diet. The patient does not participate in regular exercise at present. She generally feels fairly well. She reports sleeping well. She does have additional problems to discuss today.   HPI  Had an episode this weekend of SOB with exertion and dizziness.  Doesn't seem to come on gradually.  Has substernal chest pressure.  Improves with rest.  Has happened several times.    Medications: Outpatient Medications Prior to Visit  Medication Sig   atorvastatin  (LIPITOR) 40 MG tablet TAKE 1 TABLET BY MOUTH DAILY AT 6 PM.   budesonide (PULMICORT FLEXHALER) 180 MCG/ACT inhaler Inhale 2 puffs into the lungs daily.   Budesonide-Formoterol Fumarate (SYMBICORT IN) Inhale into the lungs as needed.   busPIRone (BUSPAR) 15 MG tablet Take by mouth. 30 mg in the morning and 15 mg in the evening   DULoxetine  (CYMBALTA ) 60 MG capsule TAKE 1 CAPSULE BY MOUTH EVERY DAY   gabapentin  (NEURONTIN ) 600 MG tablet TAKE 2 TABLETS (1,200 MG TOTAL) BY MOUTH 2 (TWO) TIMES DAILY.   levalbuterol  (XOPENEX  HFA) 45 MCG/ACT inhaler INHALE 2 PUFFS INTO THE LUNGS EVERY 4 (FOUR) HOURS AS NEEDED FOR WHEEZING OR SHORTNESS OF BREATH.   levothyroxine  (SYNTHROID ) 75 MCG tablet Take one tablet on an empty stomach two days a week.   levothyroxine  (SYNTHROID ) 88 MCG tablet TAKE ONE TABLET ON AN EMPTY STOMACH FIVE DAYS A WEEK.   Naproxen  Sodium 220 MG CAPS Take 220 mg by mouth 2 (two) times daily.   pantoprazole  (PROTONIX ) 40 MG tablet TAKE 1 TABLET (40 MG TOTAL) BY MOUTH 2 (TWO) TIMES DAILY BEFORE A MEAL.   polyethylene glycol powder (GLYCOLAX /MIRALAX ) powder Take 255 g by mouth once.   temazepam (RESTORIL) 30 MG capsule Take 15 mg by mouth at bedtime.   clopidogrel  (PLAVIX ) 75 MG tablet TAKE 1 TABLET (75 MG TOTAL) BY MOUTH DAILY. PLEASE SCHEDULE OFFICE VISIT BEFORE ANY FUTURE REFILLS (Patient not taking: Reported on 01/15/2022)   No facility-administered medications prior to visit.    Allergies  Allergen Reactions   Ciprocinonide [Fluocinolone] Diarrhea   Ciprofloxacin    Flexeril [Cyclobenzaprine] Swelling   Lyrica [Pregabalin] Swelling   Other Other (See Comments)    Childhood reaction, uncertain what it was   Savella [Milnacipran Hcl] Swelling   Sulfa Antibiotics     Unknown  Childhood reaction, uncertain what it was   Sulfamethoxazole     Reaction occurred as a child, pt doesn't know what the reaction was   Keflex [Cephalexin] Rash    Patient Care Team: Eva Jon HERO, MD as PCP - General (Family Medicine) Skeet Juliene SAUNDERS, DO as Consulting Physician (Neurology) Tasia Lung, MD as Consulting Physician (Psychiatry) Dessa, Reyes ORN, MD as Consulting Physician (General Surgery) Cesario Boer, MD as Attending Physician (Physical Medicine and Rehabilitation) Sheril Coy, MD as Consulting Physician (Orthopedic Surgery)  Review of Systems  Constitutional:  Positive for fatigue.  HENT:  Positive for congestion and postnasal drip.   Eyes:  Positive for photophobia, pain, discharge, redness, itching and visual disturbance.  Respiratory:  Positive for cough, shortness of breath and wheezing.   Cardiovascular:  Positive for chest pain.  Genitourinary:  Positive for frequency and urgency.  Musculoskeletal:  Positive for arthralgias and myalgias.  Allergic/Immunologic: Positive for environmental allergies.   Neurological:  Positive for tremors.  Psychiatric/Behavioral:  Positive for dysphoric mood and sleep disturbance. The patient is nervous/anxious.   All other systems reviewed and are negative.  Last CBC Lab Results  Component Value Date   WBC 5.5 04/24/2021   HGB 11.0 (L) 04/24/2021   HCT 32.8 (L) 04/24/2021   MCV 85 04/24/2021   MCH 28.6 04/24/2021   RDW 13.3 04/24/2021   PLT 248 04/24/2021   Last metabolic panel Lab Results  Component Value Date   GLUCOSE 94 01/12/2021   NA 139 01/12/2021   K 4.2 01/12/2021   CL 100 01/12/2021   CO2 25 01/12/2021   BUN 11 01/12/2021   CREATININE 0.79 01/12/2021   EGFR 77 01/12/2021   CALCIUM  9.2 01/12/2021   PROT 6.3 01/12/2021   ALBUMIN 4.2 01/12/2021   LABGLOB 2.1 01/12/2021   AGRATIO 2.0 01/12/2021   BILITOT 0.4 01/12/2021   ALKPHOS 122 (H) 01/12/2021   AST 20 01/12/2021   ALT 17 01/12/2021   ANIONGAP 6 10/16/2015   Last lipids Lab Results  Component Value Date   CHOL 156 01/12/2021   HDL 51 01/12/2021   LDLCALC 81 01/12/2021   TRIG 139 01/12/2021   CHOLHDL 3.1 01/12/2021   Last hemoglobin A1c No results found for: HGBA1C Last thyroid  functions Lab Results  Component Value Date   TSH 2.150 01/12/2021   Last vitamin D  Lab Results  Component Value Date   VD25OH 35.4 01/12/2021   Last vitamin B12 and Folate Lab Results  Component Value Date   VITAMINB12 538 02/13/2018        Objective    Vitals: BP (!) 96/58 (BP Location: Left Arm, Patient Position: Sitting, Cuff Size: Large)   Pulse 74   Temp 97.8 F (36.6 C) (Oral)   Resp 16   Ht 5' 2.5 (1.588 m)   Wt 167 lb 9.6 oz (76 kg)   SpO2 96%   BMI 30.17 kg/m  BP Readings from Last 3 Encounters:  01/15/22 (!) 96/58  10/30/21 124/72  06/27/21 (!) 191/77   Wt Readings from Last 3 Encounters:  01/15/22 167 lb 9.6 oz (76 kg)  10/30/21 173 lb (78.5 kg)  06/27/21 172 lb 12.8 oz (78.4 kg)     Physical Exam Vitals reviewed.  Constitutional:       General: She is not in acute distress.    Appearance: Normal appearance. She is well-developed. She is not diaphoretic.  HENT:     Head: Normocephalic and atraumatic.     Right Ear: Tympanic membrane, ear canal and external ear normal.     Left Ear: Tympanic membrane, ear canal and external ear normal.     Nose: Nose normal.     Mouth/Throat:     Mouth: Mucous membranes are moist.     Pharynx: Oropharynx is clear. No oropharyngeal exudate.  Eyes:     General: No scleral icterus.    Conjunctiva/sclera: Conjunctivae normal.     Pupils: Pupils are equal, round, and reactive to light.  Neck:     Thyroid : No thyromegaly.  Cardiovascular:  Rate and Rhythm: Normal rate and regular rhythm.     Pulses: Normal pulses.     Heart sounds: Normal heart sounds. No murmur heard. Pulmonary:     Effort: Pulmonary effort is normal. No respiratory distress.     Breath sounds: Normal breath sounds. No wheezing or rales.  Abdominal:     General: There is no distension.     Palpations: Abdomen is soft.     Tenderness: There is no abdominal tenderness.  Musculoskeletal:        General: No deformity.     Cervical back: Neck supple.     Right lower leg: No edema.     Left lower leg: No edema.  Lymphadenopathy:     Cervical: No cervical adenopathy.  Skin:    General: Skin is warm and dry.     Findings: No rash.  Neurological:     Mental Status: She is alert and oriented to person, place, and time. Mental status is at baseline.     Sensory: No sensory deficit.     Motor: No weakness.     Gait: Gait normal.  Psychiatric:        Mood and Affect: Mood normal.        Behavior: Behavior normal.        Thought Content: Thought content normal.   EKG: Unchanged from previous. No ischemic changes.  Most recent functional status assessment:    01/15/2022    2:15 PM  In your present state of health, do you have any difficulty performing the following activities:  Hearing? 0  Vision? 0  Difficulty  concentrating or making decisions? 0  Walking or climbing stairs? 1  Dressing or bathing? 0  Doing errands, shopping? 0   Most recent fall risk assessment:    01/15/2022    2:13 PM  Fall Risk   Falls in the past year? 1  Number falls in past yr: 0  Injury with Fall? 0  Risk for fall due to : No Fall Risks  Follow up Falls evaluation completed;Education provided;Falls prevention discussed    Most recent depression screenings:    01/15/2022    2:14 PM 04/24/2021   10:03 AM  PHQ 2/9 Scores  PHQ - 2 Score 0 0  PHQ- 9 Score 3 8   Most recent cognitive screening:    01/15/2022    2:16 PM  6CIT Screen  What Year? 0 points  What month? 0 points  What time? 0 points  Count back from 20 0 points  Months in reverse 0 points  Repeat phrase 0 points  Total Score 0 points   Most recent Audit-C alcohol  use screening    01/15/2022    2:15 PM  Alcohol  Use Disorder Test (AUDIT)  1. How often do you have a drink containing alcohol ? 0  2. How many drinks containing alcohol  do you have on a typical day when you are drinking? 0  3. How often do you have six or more drinks on one occasion? 0  AUDIT-C Score 0   A score of 3 or more in women, and 4 or more in men indicates increased risk for alcohol  abuse, EXCEPT if all of the points are from question 1   No results found for any visits on 01/15/22.  Assessment & Plan     Annual wellness visit done today including the all of the following: Reviewed patient's Family Medical History Reviewed and updated list of patient's medical providers Assessment of  cognitive impairment was done Assessed patient's functional ability Established a written schedule for health screening services Health Risk Assessent Completed and Reviewed  Exercise Activities and Dietary recommendations  Goals      DIET - INCREASE WATER INTAKE     Recommend to remove any items from the home that may cause slips or trips.        Immunization History   Administered Date(s) Administered   Fluad Quad(high Dose 65+) 06/14/2020   Influenza, High Dose Seasonal PF 06/12/2017, 04/24/2019   PFIZER(Purple Top)SARS-COV-2 Vaccination 10/01/2019, 10/23/2019, 06/14/2020   Pneumococcal Conjugate-13 06/12/2017   Pneumococcal Polysaccharide-23 09/01/2012   Tdap 11/27/2016   Zoster Recombinat (Shingrix) 06/12/2017    Health Maintenance  Topic Date Due   Zoster Vaccines- Shingrix (2 of 2) 08/07/2017   COVID-19 Vaccine (4 - Booster for Pfizer series) 08/09/2020   INFLUENZA VACCINE  04/03/2022   TETANUS/TDAP  11/28/2026   Pneumonia Vaccine 59+ Years old  Completed   DEXA SCAN  Completed   Hepatitis C Screening  Completed   HPV VACCINES  Aged Out   COLONOSCOPY (Pts 45-39yrs Insurance coverage will need to be confirmed)  Discontinued     Discussed health benefits of physical activity, and encouraged her to engage in regular exercise appropriate for her age and condition.    Problem List Items Addressed This Visit       Cardiovascular and Mediastinum   Angina pectoris (HCC)    New problem Stable - only occurs with exertion - no chest pain today or at rest Needs re-eval by cardiology Referral placed today discussed red flags.       Relevant Orders   Ambulatory referral to Cardiology   EKG 12-Lead (Completed)     Endocrine   Hypothyroidism    Previously well controlled Continue Synthroid  at current dose  Recheck TSH and adjust Synthroid  as indicated        Relevant Orders   TSH     Other   Hypercholesteremia    Previously well controlled Continue statin Repeat FLP and CMP      Relevant Orders   Comprehensive metabolic panel   Lipid Panel With LDL/HDL Ratio   Generalized anxiety disorder with panic attacks    Chronic and well controlled Continue cymbalta  and buspar       Obesity    Discussed importance of healthy weight management Discussed diet and exercise Congratulated on weight loss      DOE (dyspnea on  exertion)    New problem x 3 episodes with associated chest pain Concern for CAD Will refer back to Cardiology for re-eval (hasnt been seen since 2019 and new symptoms)       Relevant Orders   Ambulatory referral to Cardiology   EKG 12-Lead (Completed)   Other Visit Diagnoses     Encounter for annual wellness visit (AWV) in Medicare patient    -  Primary   Anemia, unspecified type       Relevant Orders   CBC        Return in about 1 year (around 01/16/2023) for AWV.     I, Jon Eva, MD, have reviewed all documentation for this visit. The documentation on 01/15/22 for the exam, diagnosis, procedures, and orders are all accurate and complete.   Gaytha Raybourn, Jon HERO, MD, MPH [facility]

## 2022-01-15 ENCOUNTER — Ambulatory Visit (INDEPENDENT_AMBULATORY_CARE_PROVIDER_SITE_OTHER): Payer: Medicare Other | Admitting: Family Medicine

## 2022-01-15 ENCOUNTER — Encounter: Payer: Self-pay | Admitting: Family Medicine

## 2022-01-15 VITALS — BP 96/58 | HR 74 | Temp 97.8°F | Resp 16 | Ht 62.5 in | Wt 167.6 lb

## 2022-01-15 DIAGNOSIS — F410 Panic disorder [episodic paroxysmal anxiety] without agoraphobia: Secondary | ICD-10-CM

## 2022-01-15 DIAGNOSIS — Z0000 Encounter for general adult medical examination without abnormal findings: Secondary | ICD-10-CM | POA: Diagnosis not present

## 2022-01-15 DIAGNOSIS — I209 Angina pectoris, unspecified: Secondary | ICD-10-CM | POA: Diagnosis not present

## 2022-01-15 DIAGNOSIS — E039 Hypothyroidism, unspecified: Secondary | ICD-10-CM

## 2022-01-15 DIAGNOSIS — F411 Generalized anxiety disorder: Secondary | ICD-10-CM | POA: Diagnosis not present

## 2022-01-15 DIAGNOSIS — R0609 Other forms of dyspnea: Secondary | ICD-10-CM | POA: Diagnosis not present

## 2022-01-15 DIAGNOSIS — E669 Obesity, unspecified: Secondary | ICD-10-CM | POA: Diagnosis not present

## 2022-01-15 DIAGNOSIS — E66811 Obesity, class 1: Secondary | ICD-10-CM

## 2022-01-15 DIAGNOSIS — Z6832 Body mass index (BMI) 32.0-32.9, adult: Secondary | ICD-10-CM

## 2022-01-15 DIAGNOSIS — D649 Anemia, unspecified: Secondary | ICD-10-CM | POA: Diagnosis not present

## 2022-01-15 DIAGNOSIS — R748 Abnormal levels of other serum enzymes: Secondary | ICD-10-CM | POA: Diagnosis not present

## 2022-01-15 DIAGNOSIS — E7800 Pure hypercholesterolemia, unspecified: Secondary | ICD-10-CM | POA: Diagnosis not present

## 2022-01-15 NOTE — Assessment & Plan Note (Signed)
Chronic and well controlled ?Continue cymbalta and buspar ?

## 2022-01-15 NOTE — Assessment & Plan Note (Signed)
Discussed importance of healthy weight management ?Discussed diet and exercise ?Congratulated on weight loss ?

## 2022-01-15 NOTE — Assessment & Plan Note (Signed)
New problem x 3 episodes with associated chest pain ?Concern for CAD ?Will refer back to Cardiology for re-eval (hasnt been seen since 2019 and new symptoms) ?

## 2022-01-15 NOTE — Assessment & Plan Note (Signed)
Previously well controlled Continue statin Repeat FLP and CMP  

## 2022-01-15 NOTE — Assessment & Plan Note (Signed)
Previously well controlled Continue Synthroid at current dose  Recheck TSH and adjust Synthroid as indicated   

## 2022-01-15 NOTE — Assessment & Plan Note (Signed)
New problem ?Stable - only occurs with exertion - no chest pain today or at rest ?Needs re-eval by cardiology ?Referral placed today ?discussed red flags. ?

## 2022-01-16 LAB — CBC
Hematocrit: 40 % (ref 34.0–46.6)
Hemoglobin: 13.7 g/dL (ref 11.1–15.9)
MCH: 31.5 pg (ref 26.6–33.0)
MCHC: 34.3 g/dL (ref 31.5–35.7)
MCV: 92 fL (ref 79–97)
Platelets: 240 10*3/uL (ref 150–450)
RBC: 4.35 x10E6/uL (ref 3.77–5.28)
RDW: 13 % (ref 11.7–15.4)
WBC: 6.6 10*3/uL (ref 3.4–10.8)

## 2022-01-16 LAB — COMPREHENSIVE METABOLIC PANEL
ALT: 14 IU/L (ref 0–32)
AST: 16 IU/L (ref 0–40)
Albumin/Globulin Ratio: 2 (ref 1.2–2.2)
Albumin: 4.3 g/dL (ref 3.7–4.7)
Alkaline Phosphatase: 146 IU/L — ABNORMAL HIGH (ref 44–121)
BUN/Creatinine Ratio: 10 — ABNORMAL LOW (ref 12–28)
BUN: 7 mg/dL — ABNORMAL LOW (ref 8–27)
Bilirubin Total: 0.3 mg/dL (ref 0.0–1.2)
CO2: 23 mmol/L (ref 20–29)
Calcium: 9.5 mg/dL (ref 8.7–10.3)
Chloride: 106 mmol/L (ref 96–106)
Creatinine, Ser: 0.73 mg/dL (ref 0.57–1.00)
Globulin, Total: 2.2 g/dL (ref 1.5–4.5)
Glucose: 103 mg/dL — ABNORMAL HIGH (ref 70–99)
Potassium: 4.2 mmol/L (ref 3.5–5.2)
Sodium: 144 mmol/L (ref 134–144)
Total Protein: 6.5 g/dL (ref 6.0–8.5)
eGFR: 84 mL/min/{1.73_m2} (ref >59)

## 2022-01-16 LAB — LIPID PANEL WITH LDL/HDL RATIO
Cholesterol, Total: 155 mg/dL (ref 100–199)
HDL: 43 mg/dL (ref >39)
LDL Chol Calc (NIH): 80 mg/dL (ref 0–99)
LDL/HDL Ratio: 1.9 ratio (ref 0.0–3.2)
Triglycerides: 188 mg/dL — ABNORMAL HIGH (ref 0–149)
VLDL Cholesterol Cal: 32 mg/dL (ref 5–40)

## 2022-01-16 LAB — TSH: TSH: 1.81 u[IU]/mL (ref 0.450–4.500)

## 2022-01-25 ENCOUNTER — Other Ambulatory Visit: Payer: Self-pay

## 2022-01-25 DIAGNOSIS — R748 Abnormal levels of other serum enzymes: Secondary | ICD-10-CM

## 2022-01-26 ENCOUNTER — Telehealth: Payer: Self-pay

## 2022-01-26 NOTE — Telephone Encounter (Signed)
Pt given lab results per notes of Dr. Brita Romp on 01/22/22. Pt verbalized understanding. She says the GI appointment is 05/21/22 and that is too far out. She asks is there another GI office who will take her sooner, she can't wait that long. [place] is scheduled for 02/02/22. Advised I will send this to Dr. Brita Romp for review and recommendation.

## 2022-01-30 NOTE — Telephone Encounter (Signed)
Message sent to referral coordinator to change GI appt.

## 2022-02-02 ENCOUNTER — Ambulatory Visit
Admission: RE | Admit: 2022-02-02 | Discharge: 2022-02-02 | Disposition: A | Discharge status: Home / Self Care | Payer: Medicare Other | Source: Ambulatory Visit | Attending: Family Medicine | Admitting: Family Medicine

## 2022-02-02 DIAGNOSIS — R748 Abnormal levels of other serum enzymes: Secondary | ICD-10-CM | POA: Insufficient documentation

## 2022-02-07 LAB — ALKALINE PHOSPHATASE, ISOENZYMES
Alkaline Phosphatase: 147 [IU]/L — ABNORMAL HIGH (ref 44–121)
BONE FRACTION: 30 % (ref 14–68)
INTESTINAL FRAC.: 0 % (ref 0–18)
LIVER FRACTION: 70 % (ref 18–85)

## 2022-02-07 LAB — SPECIMEN STATUS REPORT

## 2022-02-12 ENCOUNTER — Ambulatory Visit (INDEPENDENT_AMBULATORY_CARE_PROVIDER_SITE_OTHER): Payer: Medicare Other | Admitting: Family Medicine

## 2022-02-12 ENCOUNTER — Encounter: Payer: Self-pay | Admitting: Family Medicine

## 2022-02-12 VITALS — BP 132/76 | HR 69 | Temp 98.2°F | Resp 16 | Wt 163.0 lb

## 2022-02-12 DIAGNOSIS — N3090 Cystitis, unspecified without hematuria: Secondary | ICD-10-CM | POA: Diagnosis not present

## 2022-02-12 DIAGNOSIS — R300 Dysuria: Secondary | ICD-10-CM | POA: Diagnosis not present

## 2022-02-12 LAB — POCT URINALYSIS DIPSTICK
Bilirubin, UA: NEGATIVE
Glucose, UA: NEGATIVE
Nitrite, UA: POSITIVE
Protein, UA: POSITIVE — AB
Spec Grav, UA: 1.02 (ref 1.010–1.025)
Urobilinogen, UA: 0.2 E.U./dL
pH, UA: 6 (ref 5.0–8.0)

## 2022-02-12 MED ORDER — NITROFURANTOIN MONOHYD MACRO 100 MG PO CAPS: 100.0000 mg | ORAL_CAPSULE | Freq: Two times a day (BID) | ORAL | 0 refills | Status: DC

## 2022-02-12 NOTE — Progress Notes (Signed)
I,Sulibeya S Dimas,acting as a Education administrator for Lavon Paganini, MD.,have documented all relevant documentation on the behalf of Lavon Paganini, MD,as directed by  Lavon Paganini, MD while in the presence of Lavon Paganini, MD.   Established patient visit   Patient: Nichole [name]   DOB: [birth year]   81 y.o. Female  MRN: [id number] Visit Date: 02/12/2022  Today's healthcare provider: Lavon Paganini, MD   Chief Complaint  Patient presents with   Urinary Tract Infection   Subjective    HPI  Urinary symptoms  She reports new onset dysuria and flank pain. The current episode started a few days ago (3 days ago) and is gradually worsening. Patient states symptoms are moderate in intensity, occurring constantly. She  has not been recently treated for similar symptoms.   Less urine production, +urgency.  Tried cranberry pills with no relief..   Associated symptoms: Yes abdominal pain Yes back pain  Yes chills No constipation  Yes cramping No diarrhea  No discharge No fever  No hematuria Yes nausea  No vomiting    ---------------------------------------------------------------------------------------   Medications: Outpatient Medications Prior to Visit  Medication Sig   atorvastatin (LIPITOR) 40 MG tablet TAKE 1 TABLET BY MOUTH DAILY AT 6 PM.   budesonide (PULMICORT FLEXHALER) 180 MCG/ACT inhaler Inhale 2 puffs into the lungs daily.   Budesonide-Formoterol Fumarate (SYMBICORT IN) Inhale into the lungs as needed.   busPIRone (BUSPAR) 15 MG tablet Take by mouth. 30 mg in the morning and 15 mg in the evening   clopidogrel (PLAVIX) 75 MG tablet TAKE 1 TABLET (75 MG TOTAL) BY MOUTH DAILY. PLEASE SCHEDULE OFFICE VISIT BEFORE ANY FUTURE REFILLS (Patient not taking: Reported on 01/15/2022)   DULoxetine (CYMBALTA) 60 MG capsule TAKE 1 CAPSULE BY MOUTH EVERY DAY   gabapentin (NEURONTIN) 600 MG tablet TAKE 2 TABLETS (1,200 MG TOTAL) BY MOUTH 2 (TWO) TIMES DAILY.   levalbuterol  (XOPENEX HFA) 45 MCG/ACT inhaler INHALE 2 PUFFS INTO THE LUNGS EVERY 4 (FOUR) HOURS AS NEEDED FOR WHEEZING OR SHORTNESS OF BREATH.   levothyroxine (SYNTHROID) 75 MCG tablet Take one tablet on an empty stomach two days a week.   levothyroxine (SYNTHROID) 88 MCG tablet TAKE ONE TABLET ON AN EMPTY STOMACH FIVE DAYS A WEEK.   Naproxen Sodium 220 MG CAPS Take 220 mg by mouth 2 (two) times daily.   pantoprazole (PROTONIX) 40 MG tablet TAKE 1 TABLET (40 MG TOTAL) BY MOUTH 2 (TWO) TIMES DAILY BEFORE A MEAL.   polyethylene glycol powder (GLYCOLAX/MIRALAX) powder Take 255 g by mouth once.   temazepam (RESTORIL) 30 MG capsule Take 15 mg by mouth at bedtime.   No facility-administered medications prior to visit.    Review of Systems  Constitutional:  Positive for chills. Negative for fever.  Gastrointestinal:  Positive for abdominal pain.  Genitourinary:  Positive for dysuria, flank pain, frequency and urgency. Negative for hematuria, vaginal bleeding and vaginal discharge.        Objective    BP 132/76 (BP Location: Left Arm, Patient Position: Sitting, Cuff Size: Large)   Pulse 69   Temp 98.2 F (36.8 C) (Oral)   Resp 16   Wt 163 lb (73.9 kg)   BMI 29.34 kg/m  BP Readings from Last 3 Encounters:  02/12/22 132/76  01/15/22 (!) 96/58  10/30/21 124/72   Wt Readings from Last 3 Encounters:  02/12/22 163 lb (73.9 kg)  01/15/22 167 lb 9.6 oz (76 kg)  10/30/21 173 lb (78.5 kg)  Physical Exam Vitals reviewed.  Constitutional:      General: She is not in acute distress.    Appearance: She is well-developed.  HENT:     Head: Normocephalic and atraumatic.  Eyes:     General: No scleral icterus.    Conjunctiva/sclera: Conjunctivae normal.  Cardiovascular:     Rate and Rhythm: Normal rate and regular rhythm.     Heart sounds: Normal heart sounds. No murmur heard. Pulmonary:     Effort: Pulmonary effort is normal. No respiratory distress.     Breath sounds: Normal breath sounds. No  wheezing or rales.  Abdominal:     General: There is no distension.     Palpations: Abdomen is soft.     Tenderness: There is abdominal tenderness in the suprapubic area. There is no guarding or rebound.  Skin:    General: Skin is warm and dry.     Capillary Refill: Capillary refill takes less than 2 seconds.     Findings: No rash.  Neurological:     Mental Status: She is alert and oriented to person, place, and time.  Psychiatric:        Behavior: Behavior normal.       Results for orders placed or performed in visit on 02/12/22  POCT urinalysis dipstick  Result Value Ref Range   Color, UA yellow    Clarity, UA dark    Glucose, UA Negative Negative   Bilirubin, UA Negative    Ketones, UA large    Spec Grav, UA 1.020 1.010 - 1.025   Blood, UA large    pH, UA 6.0 5.0 - 8.0   Protein, UA Positive (A) Negative   Urobilinogen, UA 0.2 0.2 or 1.0 E.U./dL   Nitrite, UA positive    Leukocytes, UA Moderate (2+) (A) Negative   Appearance     Odor      Assessment & Plan     1. Cystitis - Symptoms and UA consistent with UTI -No systemic symptoms or signs of pyelonephritis - No hematuria -Will start treatment with 7day course of Macrobid after reviewing allergies - no previous urine culture -We will send urine culture to confirm sensitivities -Discussed return precautions   - POCT urinalysis dipstick - Urine Culture   Meds ordered this encounter  Medications   nitrofurantoin, macrocrystal-monohydrate, (MACROBID) 100 MG capsule    Sig: Take 1 capsule (100 mg total) by mouth 2 (two) times daily for 7 days.    Dispense:  14 capsule    Refill:  0     Return if symptoms worsen or fail to improve.      I, Lavon Paganini, MD, have reviewed all documentation for this visit. The documentation on 02/12/22 for the exam, diagnosis, procedures, and orders are all accurate and complete.   Mylz Yuan, Dionne Bucy, MD, MPH [facility]

## 2022-02-15 LAB — URINE CULTURE

## 2022-02-19 ENCOUNTER — Encounter: Payer: Self-pay | Admitting: Cardiology

## 2022-02-19 ENCOUNTER — Ambulatory Visit (INDEPENDENT_AMBULATORY_CARE_PROVIDER_SITE_OTHER): Payer: Medicare Other

## 2022-02-19 ENCOUNTER — Ambulatory Visit (INDEPENDENT_AMBULATORY_CARE_PROVIDER_SITE_OTHER): Payer: Medicare Other | Admitting: Cardiology

## 2022-02-19 ENCOUNTER — Other Ambulatory Visit
Admission: RE | Admit: 2022-02-19 | Discharge: 2022-02-19 | Disposition: A | Discharge status: Home / Self Care | Payer: Medicare Other | Source: Ambulatory Visit | Attending: Cardiology | Admitting: Cardiology

## 2022-02-19 VITALS — BP 104/68 | HR 67 | Ht 62.5 in | Wt 165.4 lb

## 2022-02-19 DIAGNOSIS — R55 Syncope and collapse: Secondary | ICD-10-CM

## 2022-02-19 DIAGNOSIS — E782 Mixed hyperlipidemia: Secondary | ICD-10-CM

## 2022-02-19 DIAGNOSIS — R072 Precordial pain: Secondary | ICD-10-CM | POA: Diagnosis not present

## 2022-02-19 DIAGNOSIS — R002 Palpitations: Secondary | ICD-10-CM

## 2022-02-19 LAB — BASIC METABOLIC PANEL
Anion gap: 7 (ref 5–15)
BUN: 14 mg/dL (ref 8–23)
CO2: 28 mmol/L (ref 22–32)
Calcium: 9.5 mg/dL (ref 8.9–10.3)
Chloride: 104 mmol/L (ref 98–111)
Creatinine, Ser: 0.88 mg/dL (ref 0.44–1.00)
GFR, Estimated: >60 mL/min (ref >60)
Glucose, Bld: 81 mg/dL (ref 70–99)
Potassium: 4 mmol/L (ref 3.5–5.1)
Sodium: 139 mmol/L (ref 135–145)

## 2022-02-19 MED ORDER — IVABRADINE HCL 5 MG PO TABS
15.0000 mg | ORAL_TABLET | Freq: Once | ORAL | 0 refills | Status: AC
Start: 2022-02-19 — End: 2022-02-19

## 2022-02-19 NOTE — Patient Instructions (Addendum)
Medication Instructions:   Your physician recommends that you continue on your current medications as directed. Please refer to the Current Medication list given to you today.  *If you need a refill on your cardiac medications before your next appointment, please call your pharmacy*   Lab Work:  Please go to the medical mall for a lab (BMP) draw after your visit today.   Testing/Procedures:  Your physician has requested that you have an echocardiogram in 3 weeks. Echocardiography is a painless test that uses sound waves to create images of your heart. It provides your doctor with information about the size and shape of your heart and how well your heart's chambers and valves are working. This procedure takes approximately one hour. There are no restrictions for this procedure.  2.    Your physician has requested that you have cardiac CT. Cardiac computed tomography (CT) is a painless test that uses an x-ray machine to take clear, detailed pictures of your heart.   Your cardiac CT will be scheduled at:  [facility] [address] [place], [place] [address] [phone]  Please arrive 15 mins early for check-in and test prep.    Please follow these instructions carefully (unless otherwise directed):   On the Night Before the Test: Be sure to Drink plenty of water. Do not consume any caffeinated/decaffeinated beverages or chocolate 12 hours prior to your test.   On the Day of the Test: Drink plenty of water until 1 hour prior to the test. Do not eat any food 4 hours prior to the test. You may take your regular medications prior to the test.  Take Ivabradine (Corlanor) 15 MG two hours prior to your CT scan. FEMALES- please wear underwire-free bra if available, avoid dresses & tight clothing        After the Test: Drink plenty of water. After receiving IV contrast, you may experience a mild flushed feeling. This is normal. On  occasion, you may experience a mild rash up to 24 hours after the test. This is not dangerous. If this occurs, you can take Benadryl 25 mg and increase your fluid intake. If you experience trouble breathing, this can be serious. If it is severe call 911 IMMEDIATELY. If it is mild, please call our office. If you take any of these medications: Glipizide/Metformin, Avandament, Glucavance, please do not take 48 hours after completing test unless otherwise instructed.  Please allow 2-4 weeks for scheduling of routine cardiac CTs. Some insurance companies require a pre-authorization which may delay scheduling of this test.   For non-scheduling related questions, please contact the cardiac imaging nurse navigator should you have any questions/concerns: Marchia Bond, Cardiac Imaging Nurse Navigator Gordy Clement, Cardiac Imaging Nurse Navigator [facility] Direct Office Dial: [phone]   For scheduling needs, including cancellations and rescheduling, please call [place], [phone].      Follow-Up: At [facility], you and your health needs are our priority.  As part of our continuing mission to provide you with exceptional heart care, we have created designated Provider Care Teams.  These Care Teams include your primary Cardiologist (physician) and Advanced Practice Providers (APPs -  Physician Assistants and Nurse Practitioners) who all work together to provide you with the care you need, when you need it.  We recommend signing up for the patient portal called "MyChart".  Sign up information is provided on this After Visit Summary.  MyChart is used to connect with patients for  Virtual Visits (Telemedicine).  Patients are able to view lab/test results, encounter notes, upcoming appointments, etc.  Non-urgent messages can be sent to your provider as well.   To learn more about what you can do with MyChart, go to [email or web address].    Your physician has  recommended that you wear a Zio XT monitor for 2 weeks. This will be mailed to your home address in 4-5 business days.   Your clinician has requested a Zio heart rhythm monitor by i[employer] to be mailed to your home for you to wear for 14 days. You should expect a small box to arrive via USPS (or FedEx in some cases) within this next week. If you do not receive it please call [employer] at [phone].  Closely watching your heart at this time will help your care team understand more and provide information needed to develop your plan of care.  Please apply your Zio patch monitor the day you receive it. Keep this packaging, you will use this to return your Zio monitor.  You will easily be able to apply the monitor with the instructions provided in the Patient Guide.  If you need assistance, [employer] representatives are available 24/7 at [phone].  You can also download the [facility] app on your phone to view detailed application instructions and log symptoms.  After you wear your monitor for 14 days, place it back in the blue box or envelope, along with your Symptom Log.  To send your monitor back: Simply use the pre-addressed and pre-paid box/envelope.  Send it back through [employer] the same day you remove it via your local post office or by placing it in your mailbox.  As soon as we receive the results, they will be reviewed and your clinician will contact you.  For the first 24 hours- it is essential to not shower or exercise, to allow the patch to adhere to your skin. Avoid excessive sweating to help maximize wear time. Do not submerge the device, no hot tubs, and no swimming pools. Keep any lotions or oils away from the patch. After 24 hours you may shower with the patch on. Take brief showers with your back facing the shower head.  Do not remove patch once it has been placed because that will interrupt data and decrease adhesive wear time. Push the button when you have any symptoms  and write down what you were feeling. Once you have completed wearing your monitor, remove and place into box which has postage paid and place in your outgoing mailbox.  If for some reason you have misplaced your box then call our office and we can provide another box and/or mail it off for you.   Your next appointment:   6-8 week(s)  The format for your next appointment:   In Person  Provider:   You may see Kate Sable, MD or one of the following Advanced Practice Providers on your designated Care Team:   Murray Hodgkins, NP Christell Faith, PA-C Cadence Kathlen Mody, [place]    Other Instructions   Important Information About Sugar
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
## 2022-02-21 ENCOUNTER — Telehealth (HOSPITAL_COMMUNITY): Payer: Self-pay | Admitting: Emergency Medicine

## 2022-02-21 NOTE — Telephone Encounter (Signed)
Attempted to call patient regarding upcoming cardiac CT appointment. °Left message on voicemail with name and callback number °Jheremy Boger RN Navigator Cardiac Imaging °[facility] °[phone] Office °[phone] Cell ° °

## 2022-02-22 ENCOUNTER — Telehealth (HOSPITAL_COMMUNITY): Payer: Self-pay | Admitting: Emergency Medicine

## 2022-02-22 ENCOUNTER — Other Ambulatory Visit: Payer: Self-pay | Admitting: Cardiology

## 2022-02-22 ENCOUNTER — Ambulatory Visit
Admission: RE | Admit: 2022-02-22 | Discharge: 2022-02-22 | Disposition: A | Discharge status: Home / Self Care | Payer: Medicare Other | Source: Ambulatory Visit | Attending: Cardiology | Admitting: Cardiology

## 2022-02-22 DIAGNOSIS — R072 Precordial pain: Secondary | ICD-10-CM | POA: Diagnosis not present

## 2022-02-22 DIAGNOSIS — I2510 Atherosclerotic heart disease of native coronary artery without angina pectoris: Secondary | ICD-10-CM | POA: Diagnosis not present

## 2022-02-22 DIAGNOSIS — R931 Abnormal findings on diagnostic imaging of heart and coronary circulation: Secondary | ICD-10-CM | POA: Insufficient documentation

## 2022-02-22 MED ORDER — IOHEXOL 350 MG/ML SOLN
87.0000 mL | Freq: Once | INTRAVENOUS | Status: AC | PRN
  Administered 2022-02-22: 87 mL via INTRAVENOUS

## 2022-02-22 MED ORDER — NITROGLYCERIN 0.4 MG SL SUBL
0.8000 mg | SUBLINGUAL_TABLET | Freq: Once | SUBLINGUAL | Status: AC
  Administered 2022-02-22: 0.8 mg via SUBLINGUAL

## 2022-02-22 NOTE — Progress Notes (Signed)
Patient tolerated CT well. Drank water after. Vital signs stable encourage to drink water throughout day.Reasons explained and verbalized understanding. Ambulated steady gait.  

## 2022-02-22 NOTE — Telephone Encounter (Signed)
Attempted to call patient regarding upcoming cardiac CT appointment. °Left message on voicemail with name and callback number °Betzaida Cremeens RN Navigator Cardiac Imaging °[facility] °[phone] Office °[phone] Cell ° °

## 2022-02-23 DIAGNOSIS — R931 Abnormal findings on diagnostic imaging of heart and coronary circulation: Secondary | ICD-10-CM | POA: Diagnosis not present

## 2022-02-23 DIAGNOSIS — R002 Palpitations: Secondary | ICD-10-CM | POA: Diagnosis not present

## 2022-02-26 ENCOUNTER — Telehealth: Payer: Self-pay

## 2022-02-26 NOTE — Telephone Encounter (Signed)
The patient has been notified of the result via VM per DPR on file.  Encouraged patient to call back with any questions or concerns.  

## 2022-02-26 NOTE — Telephone Encounter (Signed)
-----   Message from Redell Cave, MD sent at 02/23/2022  2:53 PM EDT ----- Nonobstructive coronary artery disease, continue Plavix , Lipitor as prescribed.

## 2022-03-15 DIAGNOSIS — R002 Palpitations: Secondary | ICD-10-CM | POA: Diagnosis not present

## 2022-03-20 ENCOUNTER — Ambulatory Visit (INDEPENDENT_AMBULATORY_CARE_PROVIDER_SITE_OTHER): Payer: Medicare Other

## 2022-03-20 DIAGNOSIS — R072 Precordial pain: Secondary | ICD-10-CM | POA: Diagnosis not present

## 2022-03-20 LAB — ECHOCARDIOGRAM COMPLETE
AR max vel: 3.72 cm2
AV Area VTI: 4.11 cm2
AV Area mean vel: 3.93 cm2
AV Mean grad: 3 mmHg
AV Peak grad: 6 mmHg
Ao pk vel: 1.22 m/s
Area-P 1/2: 3.15 cm2
Calc EF: 53.1 %
S' Lateral: 2.6 cm
Single Plane A2C EF: 54.2 %
Single Plane A4C EF: 53.8 %

## 2022-03-27 ENCOUNTER — Other Ambulatory Visit: Payer: Self-pay | Admitting: Family Medicine

## 2022-03-28 NOTE — Telephone Encounter (Signed)
Requested medication (s) are due for refill today - expired Rx  Requested medication (s) are on the active medication list -yes  Future visit scheduled -no  Last refill: levothyroxine 75 mcg- 04/14/20 #10 3RF- varying directions                                       88 mcg - 11/24/21 #60 1RF- too soon if following instructions  Notes to clinic: Attempted to call patient to verify how she is taking medication - left message to call office- request sent for review   Requested Prescriptions  Pending Prescriptions Disp Refills   levothyroxine (SYNTHROID) 88 MCG tablet [Pharmacy Med Name: LEVOTHYROXINE 88 MCG TABLET] 60 tablet 1    Sig: TAKE ONE TABLET ON AN EMPTY STOMACH FIVE DAYS A WEEK.     Endocrinology:  Hypothyroid Agents Passed - 03/27/2022  2:15 PM      Passed - TSH in normal range and within 360 days    TSH  Date Value Ref Range Status  01/15/2022 1.810 0.450 - 4.500 uIU/mL Final         Passed - Valid encounter within last 12 months    Recent Outpatient Visits           1 month ago [place], Dionne Bucy, MD   2 months ago Encounter for annual wellness visit (AWV) in Medicare patient   [place], Dionne Bucy, MD   4 months ago Seasonal allergic rhinitis due to pollen   [facility], Dionne Bucy, MD   11 months ago Lymphadenopathy of head and neck   [facility] [place], Dionne Bucy, MD   1 year ago Encounter for annual wellness visit (AWV) in Medicare patient   [place], Dionne Bucy, MD       Future Appointments             In 3 weeks [place], Aaron Edelman, MD [facility]             levothyroxine (SYNTHROID) 75 MCG tablet [Pharmacy Med Name: LEVOTHYROXINE 75 MCG TABLET] 10 tablet 3    Sig: Take one tablet on an empty stomach two days a week.     Endocrinology:  Hypothyroid Agents Passed - 03/27/2022  2:15 PM       Passed - TSH in normal range and within 360 days    TSH  Date Value Ref Range Status  01/15/2022 1.810 0.450 - 4.500 uIU/mL Final         Passed - Valid encounter within last 12 months    Recent Outpatient Visits           1 month ago [place], Dionne Bucy, MD   2 months ago Encounter for annual wellness visit (AWV) in Medicare patient   [facility] [place], Dionne Bucy, MD   4 months ago Seasonal allergic rhinitis due to pollen   [facility], Dionne Bucy, MD   11 months ago Lymphadenopathy of head and neck   [facility] [place], Dionne Bucy, MD   1 year ago Encounter for annual wellness visit (AWV) in Medicare patient   [facility], Dionne Bucy, MD       Future Appointments  In 3 weeks Ag[place]-Etang, Aaron Edelman, MD [facility], [facility]               Requested Prescriptions  Pending Prescriptions Disp Refills   levothyroxine (SYNTHROID) 88 MCG tablet [Pharmacy Med Name: LEVOTHYROXINE 88 MCG TABLET] 60 tablet 1    Sig: TAKE ONE TABLET ON AN EMPTY STOMACH FIVE DAYS A WEEK.     Endocrinology:  Hypothyroid Agents Passed - 03/27/2022  2:15 PM      Passed - TSH in normal range and within 360 days    TSH  Date Value Ref Range Status  01/15/2022 1.810 0.450 - 4.500 uIU/mL Final         Passed - Valid encounter within last 12 months    Recent Outpatient Visits           1 month ago [place], Dionne Bucy, MD   2 months ago Encounter for annual wellness visit (AWV) in Medicare patient   [place], Dionne Bucy, MD   4 months ago Seasonal allergic rhinitis due to pollen   [facility], Dionne Bucy, MD   11 months ago Lymphadenopathy of head and neck   [facility] [place], Dionne Bucy, MD   1 year ago Encounter for annual wellness visit  (AWV) in Medicare patient   [place], Dionne Bucy, MD       Future Appointments             In 3 weeks [place], Aaron Edelman, MD [facility], [facility]             levothyroxine (SYNTHROID) 75 MCG tablet [Pharmacy Med Name: LEVOTHYROXINE 75 MCG TABLET] 10 tablet 3    Sig: Take one tablet on an empty stomach two days a week.     Endocrinology:  Hypothyroid Agents Passed - 03/27/2022  2:15 PM      Passed - TSH in normal range and within 360 days    TSH  Date Value Ref Range Status  01/15/2022 1.810 0.450 - 4.500 uIU/mL Final         Passed - Valid encounter within last 12 months    Recent Outpatient Visits           1 month ago [place], Dionne Bucy, MD   2 months ago Encounter for annual wellness visit (AWV) in Medicare patient   [place], Dionne Bucy, MD   4 months ago Seasonal allergic rhinitis due to pollen   [facility], Dionne Bucy, MD   11 months ago Lymphadenopathy of head and neck   [facility] [place], Dionne Bucy, MD   1 year ago Encounter for annual wellness visit (AWV) in Medicare patient   [place], MD       Future Appointments             In 3 weeks Agbor-Etang, Aaron Edelman, MD [facility] [place]

## 2022-03-30 ENCOUNTER — Other Ambulatory Visit: Payer: Self-pay

## 2022-03-30 DIAGNOSIS — E039 Hypothyroidism, unspecified: Secondary | ICD-10-CM

## 2022-03-30 MED ORDER — LEVOTHYROXINE SODIUM 75 MCG PO TABS: ORAL_TABLET | ORAL | 3 refills | Status: DC

## 2022-03-30 NOTE — Telephone Encounter (Signed)
Patient called stating she needs   levothyroxine (SYNTHROID) 75 MCG tablet  PLEASE ADVISE !

## 2022-04-02 NOTE — Telephone Encounter (Signed)
Rx sent and pt called to inform.

## 2022-04-07 ENCOUNTER — Other Ambulatory Visit: Payer: Self-pay | Admitting: Family Medicine

## 2022-04-12 DIAGNOSIS — F331 Major depressive disorder, recurrent, moderate: Secondary | ICD-10-CM | POA: Diagnosis not present

## 2022-04-20 ENCOUNTER — Ambulatory Visit (INDEPENDENT_AMBULATORY_CARE_PROVIDER_SITE_OTHER): Payer: Medicare Other | Admitting: Cardiology

## 2022-04-20 ENCOUNTER — Encounter: Payer: Self-pay | Admitting: Cardiology

## 2022-04-20 VITALS — BP 90/50 | HR 66 | Ht 62.0 in | Wt 161.8 lb

## 2022-04-20 DIAGNOSIS — R55 Syncope and collapse: Secondary | ICD-10-CM

## 2022-04-20 DIAGNOSIS — R002 Palpitations: Secondary | ICD-10-CM | POA: Diagnosis not present

## 2022-04-20 DIAGNOSIS — I2510 Atherosclerotic heart disease of native coronary artery without angina pectoris: Secondary | ICD-10-CM | POA: Diagnosis not present

## 2022-04-20 NOTE — Patient Instructions (Signed)
Medication Instructions:  Your physician recommends that you continue on your current medications as directed. Please refer to the Current Medication list given to you today.  *If you need a refill on your cardiac medications before your next appointment, please call your pharmacy*   Lab Work: None  If you have labs (blood work) drawn today and your tests are completely normal, you will receive your results only by: [place] (if you have MyChart) OR A paper copy in the mail If you have any lab test that is abnormal or we need to change your treatment, we will call you to review the results.   Testing/Procedures: None   Follow-Up: At [facility], you and your health needs are our priority.  As part of our continuing mission to provide you with exceptional heart care, we have created designated Provider Care Teams.  These Care Teams include your primary Cardiologist (physician) and Advanced Practice Providers (APPs -  Physician Assistants and Nurse Practitioners) who all work together to provide you with the care you need, when you need it.   Your next appointment:   6 month(s)  The format for your next appointment:   In Person  Provider:   Kate Sable, MD       Important Information About Sugar
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
## 2022-04-23 NOTE — Addendum Note (Signed)
Addended by: Nestor Ramp on: 04/23/2022 09:03 AM   Modules accepted: Orders

## 2022-04-27 ENCOUNTER — Encounter: Payer: Self-pay | Admitting: Emergency Medicine

## 2022-04-27 ENCOUNTER — Emergency Department
Admission: EM | Admit: 2022-04-27 | Discharge: 2022-04-27 | Disposition: A | Discharge status: Home / Self Care | Payer: Medicare Other | Attending: Emergency Medicine | Admitting: Emergency Medicine

## 2022-04-27 ENCOUNTER — Emergency Department: Payer: Medicare Other

## 2022-04-27 ENCOUNTER — Other Ambulatory Visit: Payer: Self-pay

## 2022-04-27 DIAGNOSIS — S0990XA Unspecified injury of head, initial encounter: Secondary | ICD-10-CM | POA: Diagnosis not present

## 2022-04-27 DIAGNOSIS — Z7982 Long term (current) use of aspirin: Secondary | ICD-10-CM | POA: Insufficient documentation

## 2022-04-27 DIAGNOSIS — W06XXXA Fall from bed, initial encounter: Secondary | ICD-10-CM | POA: Diagnosis not present

## 2022-04-27 DIAGNOSIS — I2510 Atherosclerotic heart disease of native coronary artery without angina pectoris: Secondary | ICD-10-CM | POA: Diagnosis not present

## 2022-04-27 DIAGNOSIS — Z7902 Long term (current) use of antithrombotics/antiplatelets: Secondary | ICD-10-CM | POA: Insufficient documentation

## 2022-04-27 DIAGNOSIS — Y92003 Bedroom of unspecified non-institutional (private) residence as the place of occurrence of the external cause: Secondary | ICD-10-CM | POA: Diagnosis not present

## 2022-04-27 DIAGNOSIS — S0083XA Contusion of other part of head, initial encounter: Secondary | ICD-10-CM | POA: Insufficient documentation

## 2022-04-27 DIAGNOSIS — S0003XA Contusion of scalp, initial encounter: Secondary | ICD-10-CM | POA: Diagnosis not present
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
## 2022-04-27 NOTE — ED Notes (Signed)
Patient discharged , pending transportation home. Requested a cab voucher , Told cabs don's start running until 0630.

## 2022-04-27 NOTE — ED Triage Notes (Addendum)
Pt arrived via [facility] from home with reports of falling out of bed hitting the L side of her head, pt states she fell while she was dreaming,   pt on plavix, denies LOC, pt axo x 4, pt ambulatory in scene.  Pt holding ICE to forehead.   Pt lives with daughter

## 2022-04-30 DIAGNOSIS — S0510XA Contusion of eyeball and orbital tissues, unspecified eye, initial encounter: Secondary | ICD-10-CM | POA: Diagnosis not present

## 2022-05-07 ENCOUNTER — Other Ambulatory Visit: Payer: Self-pay | Admitting: Family Medicine
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
## 2022-05-15 ENCOUNTER — Ambulatory Visit: Payer: Medicare Other | Admitting: Neurology

## 2022-05-15 ENCOUNTER — Encounter: Payer: Self-pay | Admitting: Neurology

## 2022-05-15 DIAGNOSIS — Z029 Encounter for administrative examinations, unspecified: Secondary | ICD-10-CM

## 2022-05-21 ENCOUNTER — Ambulatory Visit: Payer: Medicare Other | Admitting: Gastroenterology
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
## 2022-06-04 DIAGNOSIS — F331 Major depressive disorder, recurrent, moderate: Secondary | ICD-10-CM | POA: Diagnosis not present

## 2022-06-04 DIAGNOSIS — F419 Anxiety disorder, unspecified: Secondary | ICD-10-CM | POA: Diagnosis not present
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
## 2022-06-07 ENCOUNTER — Encounter: Payer: Self-pay | Admitting: Neurology

## 2022-06-07 ENCOUNTER — Ambulatory Visit (INDEPENDENT_AMBULATORY_CARE_PROVIDER_SITE_OTHER): Payer: Medicare Other | Admitting: Neurology

## 2022-06-07 VITALS — BP 175/76 | HR 68 | Ht 62.0 in | Wt 163.8 lb

## 2022-06-07 DIAGNOSIS — R030 Elevated blood-pressure reading, without diagnosis of hypertension: Secondary | ICD-10-CM

## 2022-06-07 DIAGNOSIS — G44309 Post-traumatic headache, unspecified, not intractable: Secondary | ICD-10-CM | POA: Diagnosis not present

## 2022-06-07 DIAGNOSIS — I209 Angina pectoris, unspecified: Secondary | ICD-10-CM

## 2022-06-07 DIAGNOSIS — R2681 Unsteadiness on feet: Secondary | ICD-10-CM | POA: Diagnosis not present

## 2022-06-07 DIAGNOSIS — Z87820 Personal history of traumatic brain injury: Secondary | ICD-10-CM

## 2022-06-07 NOTE — Patient Instructions (Signed)
Refer to physical therapy for balance problems Continue duloxetine '60mg'$  daily Limit use of pain relievers to no more than 2 days out of week to prevent risk of rebound or medication-overuse headache. Follow up 6 months.

## 2022-06-08 DIAGNOSIS — Z23 Encounter for immunization: Secondary | ICD-10-CM | POA: Diagnosis not present

## 2022-06-15 DIAGNOSIS — M25551 Pain in right hip: Secondary | ICD-10-CM | POA: Diagnosis not present

## 2022-06-19 ENCOUNTER — Other Ambulatory Visit: Payer: Self-pay | Admitting: Gastroenterology

## 2022-06-26 DIAGNOSIS — H532 Diplopia: Secondary | ICD-10-CM | POA: Diagnosis not present

## 2022-06-26 DIAGNOSIS — H2512 Age-related nuclear cataract, left eye: Secondary | ICD-10-CM | POA: Diagnosis not present

## 2022-06-27 ENCOUNTER — Ambulatory Visit: Payer: Medicare Other | Admitting: Neurology

## 2022-07-04 DIAGNOSIS — F331 Major depressive disorder, recurrent, moderate: Secondary | ICD-10-CM | POA: Diagnosis not present

## 2022-07-11 DIAGNOSIS — F331 Major depressive disorder, recurrent, moderate: Secondary | ICD-10-CM | POA: Diagnosis not present

## 2022-07-11 DIAGNOSIS — F419 Anxiety disorder, unspecified: Secondary | ICD-10-CM | POA: Diagnosis not present

## 2022-07-18 ENCOUNTER — Other Ambulatory Visit: Payer: Self-pay | Admitting: Family Medicine

## 2022-07-18 DIAGNOSIS — M79671 Pain in right foot: Secondary | ICD-10-CM | POA: Diagnosis not present

## 2022-07-18 DIAGNOSIS — M25551 Pain in right hip: Secondary | ICD-10-CM | POA: Diagnosis not present

## 2022-07-18 MED ORDER — GABAPENTIN 600 MG PO TABS: 1200.0000 mg | ORAL_TABLET | Freq: Two times a day (BID) | ORAL | 0 refills | Status: DC

## 2022-07-18 NOTE — Telephone Encounter (Signed)
Requested Prescriptions  Pending Prescriptions Disp Refills   gabapentin (NEURONTIN) 600 MG tablet 360 tablet 0    Sig: Take 2 tablets (1,200 mg total) by mouth 2 (two) times daily.     Neurology: Anticonvulsants - gabapentin Passed - 07/18/2022  2:28 PM      Passed - Cr in normal range and within 360 days    Creatinine  Date Value Ref Range Status  12/11/2012 0.98 0.60 - 1.30 mg/dL Final   Creatinine, Ser  Date Value Ref Range Status  02/19/2022 0.88 0.44 - 1.00 mg/dL Final         Passed - Completed PHQ-2 or PHQ-9 in the last 360 days      Passed - Valid encounter within last 12 months    Recent Outpatient Visits           5 months ago [place], Dionne Bucy, MD   6 months ago Encounter for annual wellness visit (AWV) in Medicare patient   [facility] [place], Dionne Bucy, MD   8 months ago Seasonal allergic rhinitis due to pollen   [facility], Dionne Bucy, MD   1 year ago Lymphadenopathy of head and neck   [facility] [place], Dionne Bucy, MD   1 year ago Encounter for annual wellness visit (AWV) in Medicare patient   [facility], Dionne Bucy, MD

## 2022-07-18 NOTE — Telephone Encounter (Signed)
Medication Refill - Medication: gabapentin (NEURONTIN) 600 MG tablet   Has the patient contacted their pharmacy? Yes.   (Agent: If no, request that the patient contact the pharmacy for the refill. If patient does not wish to contact the pharmacy document the reason why and proceed with request.) (Agent: If yes, when and what did the pharmacy advise?)  Preferred Pharmacy (with phone number or street name):  [id number] [address] [place] [address] Phone: [phone]: [phone]  Has the patient been seen for an appointment in the last year OR does the patient have an upcoming appointment? Yes.    Agent: Please be advised that RX refills may take up to 3 business days. We ask that you follow-up with your pharmacy.

## 2022-07-31 ENCOUNTER — Other Ambulatory Visit: Payer: Self-pay | Admitting: Physician Assistant

## 2022-07-31 DIAGNOSIS — E039 Hypothyroidism, unspecified: Secondary | ICD-10-CM

## 2022-07-31 NOTE — Telephone Encounter (Signed)
Pt called to see if her efill for Levothyroxine 75MCG was sent to the pharmacy / pt stated she is completely out and has been taking the 88MCG's that she has left in place of / please advise

## 2022-07-31 NOTE — Telephone Encounter (Signed)
Requested Prescriptions  Pending Prescriptions Disp Refills   levothyroxine (SYNTHROID) 75 MCG tablet [Pharmacy Med Name: LEVOTHYROXINE 75 MCG TABLET] 10 tablet 3    Sig: TAKE ONE TABLET ON AN EMPTY STOMACH TWO DAYS A WEEK.     Endocrinology:  Hypothyroid Agents Passed - 07/31/2022 12:43 PM      Passed - TSH in normal range and within 360 days    TSH  Date Value Ref Range Status  01/15/2022 1.810 0.450 - 4.500 uIU/mL Final         Passed - Valid encounter within last 12 months    Recent Outpatient Visits           5 months ago [place], Dionne Bucy, MD   6 months ago Encounter for annual wellness visit (AWV) in Medicare patient   [facility] [place], Dionne Bucy, MD   9 months ago Seasonal allergic rhinitis due to pollen   [facility], Dionne Bucy, MD   1 year ago Lymphadenopathy of head and neck   [facility] [place], Dionne Bucy, MD   1 year ago Encounter for annual wellness visit (AWV) in Medicare patient   [facility], Dionne Bucy, MD

## 2022-08-07 ENCOUNTER — Telehealth: Payer: Self-pay | Admitting: Family Medicine

## 2022-08-07 ENCOUNTER — Encounter: Payer: Self-pay | Admitting: Neurology

## 2022-08-07 DIAGNOSIS — E039 Hypothyroidism, unspecified: Secondary | ICD-10-CM

## 2022-08-07 NOTE — Telephone Encounter (Signed)
[facility] the following medications:   levothyroxine (SYNTHROID) 88 MCG tablet   Note from pharmacy:  pt looking for Synthroid 88MCG.    Looks like  levothyroxine (SYNTHROID) 75 MCG tablet  was sent in instead  Please advise.

## 2022-08-08 MED ORDER — LEVOTHYROXINE SODIUM 75 MCG PO TABS: ORAL_TABLET | ORAL | 3 refills | Status: DC

## 2022-08-08 MED ORDER — LEVOTHYROXINE SODIUM 88 MCG PO TABS: ORAL_TABLET | ORAL | 1 refills | Status: DC

## 2022-08-22 DIAGNOSIS — F419 Anxiety disorder, unspecified: Secondary | ICD-10-CM | POA: Diagnosis not present

## 2022-08-22 DIAGNOSIS — F331 Major depressive disorder, recurrent, moderate: Secondary | ICD-10-CM | POA: Diagnosis not present

## 2022-09-21 DIAGNOSIS — M25562 Pain in left knee: Secondary | ICD-10-CM | POA: Diagnosis not present

## 2022-10-03 DIAGNOSIS — F419 Anxiety disorder, unspecified: Secondary | ICD-10-CM | POA: Diagnosis not present

## 2022-10-03 DIAGNOSIS — F331 Major depressive disorder, recurrent, moderate: Secondary | ICD-10-CM | POA: Diagnosis not present

## 2022-10-03 DIAGNOSIS — M25571 Pain in right ankle and joints of right foot: Secondary | ICD-10-CM | POA: Diagnosis not present

## 2022-10-06 ENCOUNTER — Other Ambulatory Visit: Payer: Self-pay | Admitting: Family Medicine

## 2022-10-08 NOTE — Telephone Encounter (Signed)
Requested Prescriptions  Pending Prescriptions Disp Refills   pantoprazole (PROTONIX) 40 MG tablet [Pharmacy Med Name: PANTOPRAZOLE SOD DR 40 MG TAB] 180 tablet 0    Sig: TAKE 1 TABLET (40 MG TOTAL) BY MOUTH TWICE A DAY BEFORE MEALS     Gastroenterology: Proton Pump Inhibitors Passed - 10/06/2022  1:02 AM      Passed - Valid encounter within last 12 months    Recent Outpatient Visits           7 months ago [place], Dionne Bucy, MD   8 months ago Encounter for annual wellness visit (AWV) in Medicare patient   [facility] [place], Dionne Bucy, MD   11 months ago Seasonal allergic rhinitis due to pollen   [facility], Dionne Bucy, MD   1 year ago Lymphadenopathy of head and neck   [place], Dionne Bucy, MD   1 year ago Encounter for annual wellness visit (AWV) in Medicare patient   [place], Dionne Bucy, MD               atorvastatin (LIPITOR) 40 MG tablet [Pharmacy Med Name: ATORVASTATIN 40 MG TABLET] 90 tablet 0    Sig: TAKE 1 TABLET BY MOUTH DAILY AT 6 PM.     Cardiovascular:  Antilipid - Statins Failed - 10/06/2022  1:02 AM      Failed - Lipid Panel in normal range within the last 12 months    Cholesterol, Total  Date Value Ref Range Status  01/15/2022 155 100 - 199 mg/dL Final   LDL Chol Calc (NIH)  Date Value Ref Range Status  01/15/2022 80 0 - 99 mg/dL Final   HDL  Date Value Ref Range Status  01/15/2022 43 >39 mg/dL Final   Triglycerides  Date Value Ref Range Status  01/15/2022 188 (H) 0 - 149 mg/dL Final         Passed - Patient is not pregnant      Passed - Valid encounter within last 12 months    Recent Outpatient Visits           7 months ago [place], Dionne Bucy, MD   8 months ago Encounter for annual wellness visit  (AWV) in Medicare patient   [place], Dionne Bucy, MD   11 months ago Seasonal allergic rhinitis due to pollen   [facility], Dionne Bucy, MD   1 year ago Lymphadenopathy of head and neck   [facility] [place], Dionne Bucy, MD   1 year ago Encounter for annual wellness visit (AWV) in Medicare patient   [facility] [place], Dionne Bucy, MD

## 2022-10-11 ENCOUNTER — Other Ambulatory Visit: Payer: Self-pay | Admitting: Family Medicine

## 2022-10-13 ENCOUNTER — Other Ambulatory Visit: Payer: Self-pay | Admitting: Family Medicine

## 2022-10-16 DIAGNOSIS — M25571 Pain in right ankle and joints of right foot: Secondary | ICD-10-CM | POA: Diagnosis not present

## 2022-10-19 DIAGNOSIS — M25571 Pain in right ankle and joints of right foot: Secondary | ICD-10-CM | POA: Diagnosis not present

## 2022-10-25 DIAGNOSIS — H029 Unspecified disorder of eyelid: Secondary | ICD-10-CM | POA: Diagnosis not present

## 2022-10-25 DIAGNOSIS — L910 Hypertrophic scar: Secondary | ICD-10-CM | POA: Diagnosis not present

## 2022-10-29 ENCOUNTER — Other Ambulatory Visit: Payer: Self-pay | Admitting: Family Medicine

## 2022-10-29 DIAGNOSIS — Z1231 Encounter for screening mammogram for malignant neoplasm of breast: Secondary | ICD-10-CM

## 2022-11-01 DIAGNOSIS — F331 Major depressive disorder, recurrent, moderate: Secondary | ICD-10-CM | POA: Diagnosis not present

## 2022-11-02 ENCOUNTER — Other Ambulatory Visit: Payer: Self-pay | Admitting: Family Medicine

## 2022-11-16 ENCOUNTER — Ambulatory Visit
Admission: RE | Admit: 2022-11-16 | Discharge: 2022-11-16 | Disposition: A | Discharge status: Home / Self Care | Payer: Medicare Other | Source: Ambulatory Visit | Attending: Family Medicine | Admitting: Family Medicine

## 2022-11-16 DIAGNOSIS — M19071 Primary osteoarthritis, right ankle and foot: Secondary | ICD-10-CM | POA: Diagnosis not present

## 2022-11-16 DIAGNOSIS — Z1231 Encounter for screening mammogram for malignant neoplasm of breast: Secondary | ICD-10-CM | POA: Diagnosis not present

## 2022-11-22 DIAGNOSIS — H029 Unspecified disorder of eyelid: Secondary | ICD-10-CM | POA: Diagnosis not present

## 2022-11-22 DIAGNOSIS — L910 Hypertrophic scar: Secondary | ICD-10-CM | POA: Diagnosis not present

## 2022-11-22 NOTE — Progress Notes (Signed)
Hi [place]  Normal mammogram; repeat in 1 year.  Please let us know if you have any questions.  Thank you,  Tally Joe, FNP

## 2022-11-23 ENCOUNTER — Telehealth: Payer: Self-pay

## 2022-11-23 NOTE — Telephone Encounter (Signed)
-----   Message from Gwyneth Sprout, FNP sent at 11/22/2022  9:25 AM EDT ----- [place]  Normal mammogram; repeat in 1 year.  Please let us know if you have any questions.  Thank you,  Tally Joe, FNP

## 2022-11-29 ENCOUNTER — Ambulatory Visit: Payer: Self-pay

## 2022-11-29 NOTE — Telephone Encounter (Signed)
  Chief Complaint: Bleeding hemorrhoids/ change in stools Symptoms: above Frequency: 6 months Pertinent Negatives: Patient denies  Disposition: [] ED /[] Urgent Care (no appt availability in office) / [x] Appointment(In office/virtual)/ []  [place] [facility]/ [] Home Care/ [] Refused Recommended Disposition /[] [place] [facility]/ []  Follow-up with PCP Additional Notes: Pt states that her hemorrhoids have started bleeding recently. But pt also has a more concerning problem. She states that she has had a change in bowel habits over the past few months. She states that sometimes when passing gas she will expel stool.  She states that sometimes she feels like she needs to urinate and instead has a small BM.  PT states that she feels like stools are thinner. And that the whole area has become smaller. PT is very concerned because her father had colon CA and pt has not had a colonoscopy for several years. Pt also states that she would like to switch PCP to a different provider in our practice.  Answer Assessment - Initial Assessment Questions 1. SYMPTOM:  "What's the main symptom you're concerned about?" (e.g., pain, itching, swelling, rash)     Smaller stools 2. ONSET: "When did the s/s  start?"     6 months 3. RECTAL PAIN: "Do you have any pain around your rectum?" "How bad is the pain?"  (Scale 0-10; or mild, moderate, severe)   - NONE (0): no pain   - MILD (1-3): doesn't interfere with normal activities    - MODERATE (4-7): interferes with normal activities or awakens from sleep, limping    - SEVERE (8-10): excruciating pain, unable to have a bowel movement      yes 4. RECTAL ITCHING: "Do you have any itching in this area?" "How bad is the itching?"  (Scale 0-10; or mild, moderate, severe)   - NONE: no itching   - MILD: doesn't interfere with normal activities    - MODERATE-SEVERE: interferes with normal activities or awakens from sleep     no 5. CONSTIPATION: "Do you have constipation?"  If Yes, ask: "How bad is it?"     no 6. CAUSE: "What do you think is causing the anus symptoms?"     Unsure 7. OTHER SYMPTOMS: "Do you have any other symptoms?"  (e.g., abdomen pain, fever, rectal bleeding, vomiting)     Feels feverish at times.  Protocols used: Rectal Symptoms-A-AH

## 2022-11-29 NOTE — Progress Notes (Signed)
Established patient visit   Patient: Nichole [name]   DOB: Apr 08, 1941   82 y.o. Female  MRN: [id number] Visit Date: 11/30/2022  Today's healthcare provider: Mikey Kirschner, PA-C   Chief Complaint  Patient presents with   Rectal Bleeding   Rectal Pain   Subjective    Rectal Bleeding  Episode onset: 1 month ago. The problem has been gradually worsening. The pain is moderate (Irritated anus). The stool is described as soft, mixed with blood and streaked with blood. Prior successful therapies include fiber. Associated symptoms include a fever (hot and cold), hemorrhoids, rectal pain and headaches. Pertinent negatives include no abdominal pain, no diarrhea and no vaginal bleeding. She has been Less active and sleeping poorly. She has been Eating and drinking normally. Urine output has been normal.  Patient expresses concern for bowel incontinence. Reports that whenever she had an urge to urinate she still passes bowel movement. She will leak onto underpants throughout the day.  She also reports significant rectal pain with BM as well as at rest.  Used vaseline previously topically.  Patient is requesting a referral to see a GI.  141/75  All of this; given rx for hem , ref to gi.  Medications: Outpatient Medications Prior to Visit  Medication Sig Note   albuterol (VENTOLIN HFA) 108 (90 Base) MCG/ACT inhaler Inhale 2 puffs into the lungs every 6 (six) hours as needed for shortness of breath or wheezing.    aspirin EC 81 MG tablet Take 81 mg by mouth daily. Swallow whole.    atorvastatin (LIPITOR) 40 MG tablet TAKE 1 TABLET BY MOUTH DAILY AT 6 PM.    budesonide (PULMICORT FLEXHALER) 180 MCG/ACT inhaler Inhale 2 puffs into the lungs daily.    busPIRone (BUSPAR) 15 MG tablet Take 30 mg by mouth 2 (two) times daily. 30 mg in the morning and 30 mg in the evening    DULoxetine (CYMBALTA) 60 MG capsule TAKE 1 CAPSULE BY MOUTH EVERY DAY 11/30/2022: Rx refill    gabapentin (NEURONTIN)  600 MG tablet TAKE 2 TABLETS (1,200 MG TOTAL) BY MOUTH 2 (TWO) TIMES DAILY.    levothyroxine (SYNTHROID) 75 MCG tablet TAKE ONE TABLET ON AN EMPTY STOMACH TWO DAYS A WEEK.    levothyroxine (SYNTHROID) 88 MCG tablet TAKE ONE TABLET ON AN EMPTY STOMACH FIVE DAYS A WEEK.    loratadine (CLARITIN) 10 MG tablet Take 10 mg by mouth daily.    Naproxen Sodium 220 MG CAPS Take 220 mg by mouth 2 (two) times daily.    pantoprazole (PROTONIX) 40 MG tablet TAKE 1 TABLET (40 MG TOTAL) BY MOUTH TWICE A DAY BEFORE MEALS    polyethylene glycol powder (GLYCOLAX/MIRALAX) powder Take 255 g by mouth once.    temazepam (RESTORIL) 15 MG capsule Take 15 mg by mouth at bedtime.    acetaminophen (TYLENOL) 325 MG tablet Take by mouth. (Patient not taking: Reported on 11/30/2022)    Cholecalciferol 50 MCG (2000 UT) CAPS Take by mouth as needed.    clopidogrel (PLAVIX) 75 MG tablet TAKE 1 TABLET BY MOUTH EVERY DAY    No facility-administered medications prior to visit.    Review of Systems  Constitutional:  Positive for fever (hot and cold).  Gastrointestinal:  Positive for hematochezia, hemorrhoids and rectal pain. Negative for abdominal pain and diarrhea.  Genitourinary:  Negative for vaginal bleeding.  Neurological:  Positive for headaches.    {Labs  Heme  Chem  Endocrine  Serology  Results Review (  optional):23779}   Objective    BP (!) 148/129 (BP Location: Left Arm, Patient Position: Sitting, Cuff Size: Large)   Pulse 65   Temp 98.1 F (36.7 C) (Oral)   Resp 12   Wt 162 lb 9.6 oz (73.8 kg)   SpO2 100%   BMI 29.74 kg/m  {Show previous vital signs (optional):23777}  Physical Exam Constitutional:      General: She is awake.     Appearance: She is well-developed.  HENT:     Head: Normocephalic.  Eyes:     Conjunctiva/sclera: Conjunctivae normal.  Cardiovascular:     Rate and Rhythm: Normal rate and regular rhythm.     Heart sounds: Normal heart sounds.  Pulmonary:     Effort: Pulmonary effort  is normal.     Breath sounds: Normal breath sounds.  Abdominal:     General: Abdomen is flat.     Tenderness: There is no abdominal tenderness. There is no guarding.  Skin:    General: Skin is warm.  Neurological:     Mental Status: She is alert and oriented to person, place, and time.  Psychiatric:        Attention and Perception: Attention normal.        Mood and Affect: Mood normal.        Speech: Speech normal.        Behavior: Behavior is cooperative.     ***  No results found for any visits on 11/30/22.  Assessment & Plan     ***  No follow-ups on file.      {provider attestation***:1}   Mikey Kirschner, PA-C  [place] [phone] (phone) [phone] (fax)  [place]

## 2022-11-30 ENCOUNTER — Other Ambulatory Visit: Payer: Self-pay | Admitting: Physician Assistant

## 2022-11-30 ENCOUNTER — Encounter: Payer: Self-pay | Admitting: Physician Assistant

## 2022-11-30 ENCOUNTER — Telehealth: Payer: Self-pay | Admitting: Family Medicine

## 2022-11-30 ENCOUNTER — Ambulatory Visit (INDEPENDENT_AMBULATORY_CARE_PROVIDER_SITE_OTHER): Payer: Medicare Other | Admitting: Physician Assistant

## 2022-11-30 VITALS — BP 141/75 | HR 65 | Temp 98.1°F | Resp 12 | Wt 162.6 lb

## 2022-11-30 DIAGNOSIS — K625 Hemorrhage of anus and rectum: Secondary | ICD-10-CM

## 2022-11-30 DIAGNOSIS — Z5982 Transportation insecurity: Secondary | ICD-10-CM | POA: Diagnosis not present

## 2022-11-30 DIAGNOSIS — K6289 Other specified diseases of anus and rectum: Secondary | ICD-10-CM | POA: Diagnosis not present

## 2022-11-30 MED ORDER — LIDOCAINE-HYDROCORTISONE ACE 3-1 % RE KIT: 1.0000 | PACK | Freq: Two times a day (BID) | RECTAL | 0 refills | Status: DC

## 2022-11-30 NOTE — Telephone Encounter (Signed)
Requested medication (s) are due for refill today: yes  Requested medication (s) are on the active medication list: yes  Last refill:  11/30/22  Future visit scheduled: yes  Notes to clinic:  Pharmacy comment: Alternative Requested:NOT COVERED. OVER $162.80.      Requested Prescriptions  Pending Prescriptions Disp Refills   lidocaine-hydrocortisone (ANAMANTLE) 3-1 % KIT [Pharmacy Med Name: LIDOCAINE-HC 3-1% CREAM KIT] 1 kit 0    Sig: PLACE 1 APPLICATION RECTALLY 2 (TWO) TIMES DAILY. (NOT COVERED)     Off-Protocol Failed - 11/30/2022  4:00 PM      Failed - Medication not assigned to a protocol, review manually.      Passed - Valid encounter within last 12 months    Recent Outpatient Visits           Today Rectal bleeding   [place] Mikey Kirschner, PA-C   9 months ago [place], Dionne Bucy, MD   10 months ago Encounter for annual wellness visit (AWV) in Medicare patient   [place], Dionne Bucy, MD   1 year ago Seasonal allergic rhinitis due to pollen   [facility], Dionne Bucy, MD   1 year ago Lymphadenopathy of head and neck   [place] [facility] [place], Dionne Bucy, MD

## 2022-12-01 LAB — CBC WITH DIFFERENTIAL/PLATELET
Basophils Absolute: 0 10*3/uL (ref 0.0–0.2)
Basos: 0 %
EOS (ABSOLUTE): 0.1 10*3/uL (ref 0.0–0.4)
Eos: 1 %
Hematocrit: 42 % (ref 34.0–46.6)
Hemoglobin: 14.8 g/dL (ref 11.1–15.9)
Immature Grans (Abs): 0 10*3/uL (ref 0.0–0.1)
Immature Granulocytes: 0 %
Lymphocytes Absolute: 2.8 10*3/uL (ref 0.7–3.1)
Lymphs: 26 %
MCH: 32.1 pg (ref 26.6–33.0)
MCHC: 35.2 g/dL (ref 31.5–35.7)
MCV: 91 fL (ref 79–97)
Monocytes Absolute: 0.8 10*3/uL (ref 0.1–0.9)
Monocytes: 8 %
Neutrophils Absolute: 6.8 10*3/uL (ref 1.4–7.0)
Neutrophils: 65 %
Platelets: 244 10*3/uL (ref 150–450)
RBC: 4.61 x10E6/uL (ref 3.77–5.28)
RDW: 12.4 % (ref 11.7–15.4)
WBC: 10.5 10*3/uL (ref 3.4–10.8)

## 2022-12-01 LAB — COMPREHENSIVE METABOLIC PANEL
ALT: 21 IU/L (ref 0–32)
AST: 20 IU/L (ref 0–40)
Albumin/Globulin Ratio: 2.2 (ref 1.2–2.2)
Albumin: 4.8 g/dL (ref 3.8–4.8)
Alkaline Phosphatase: 139 IU/L — ABNORMAL HIGH (ref 44–121)
BUN/Creatinine Ratio: 20 (ref 12–28)
BUN: 14 mg/dL (ref 8–27)
Bilirubin Total: 0.4 mg/dL (ref 0.0–1.2)
CO2: 25 mmol/L (ref 20–29)
Calcium: 9.9 mg/dL (ref 8.7–10.3)
Chloride: 102 mmol/L (ref 96–106)
Creatinine, Ser: 0.7 mg/dL (ref 0.57–1.00)
Globulin, Total: 2.2 g/dL (ref 1.5–4.5)
Glucose: 89 mg/dL (ref 70–99)
Potassium: 4.6 mmol/L (ref 3.5–5.2)
Sodium: 143 mmol/L (ref 134–144)
Total Protein: 7 g/dL (ref 6.0–8.5)
eGFR: 88 mL/min/{1.73_m2} (ref >59)

## 2022-12-03 ENCOUNTER — Encounter: Payer: Self-pay | Admitting: Physician Assistant

## 2022-12-03 MED ORDER — LIDOCAINE (ANORECTAL) 5 % EX CREA: TOPICAL_CREAM | CUTANEOUS | 1 refills | Status: DC

## 2022-12-03 NOTE — Progress Notes (Signed)
 PCP: Myrla Jon HERO, MD   Chief Complaint  Patient presents with   Gynecologic Exam    No concerns    HPI:      [name] is a 82 y.o. G3P3 whose LMP was No LMP recorded. Patient is postmenopausal., presents today for her MEDICARE annual examination.  Her menses are absent due to menopause; had pink 1/20; no recent sx. She does not have vasomotor sx.   Sex activity: not sexually active. She does not have vaginal dryness.  Last Pap: 09/18/18  Results were: no abnormalities /neg HPV DNA.  Mom with cx cancer hx; pt likes to cont regular paps.   Last mammogram: 11/16/22 with PCP; Results were: normal--routine follow-up in 12 months There is a FH of breast cancer in her sister. There is a FH of ovarian cancer in her mat aunt, uterine cancer in her mom. The patient does do self-breast exams.  Colonoscopy: 2017 with Dr. Sankar;  Repeat due after 5 years. FH colon cancer on pat side.  Pt had ref placed yesterday, waiting for appt.  Tobacco use: The patient denies current or previous tobacco use. Alcohol  use: none No drug use Exercise: not active  She does get adequate calcium  and sometimes Vitamin D  in her diet. DEXA: 7/21 at [facility] with osteoporosis in spine; not doing tx; plans to do f/u with PCP. Looking for new PCP.   Labs with PCP.   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 PASH   BREAST CYST ASPIRATION Left    COLONOSCOPY  2011   COLONOSCOPY WITH PROPOFOL  N/A 09/28/2015   Procedure: COLONOSCOPY WITH PROPOFOL ;  Surgeon: Louanne KANDICE Muse, MD;  Location: [facility] ENDOSCOPY;  Service: Endoscopy;  Laterality: N/A;   KNEE SURGERY Right 2007   MRI     SKIN CANCER EXCISION  2015   SKIN LESION EXCISION Left 08/2018   facial at [facility]   THYROIDECTOMY  1989   TONSILLECTOMY  1956    Family History  Problem Relation Age of Onset   Cervical cancer Mother 24   Pneumonia Mother    Prostate cancer Father    Colon cancer Father        age 78   Lung cancer Father    Heart failure Father        Lived to be 35   Peripheral vascular disease Father        CEA   Breast cancer Sister        age 34   CAD Brother 79       CABG   Esophageal cancer Brother        Metastatic Liver Cancer   Colon cancer Paternal Uncle    Heart failure Maternal Grandmother 72       Died of MI   CAD Maternal Grandfather  CAD Paternal Grandfather    Ehlers-Danlos syndrome Daughter        From her father.     Ehlers-Danlos syndrome Son    Asthma Son    Asperger's syndrome Son     Social History   Socioeconomic History   Marital status: Single    Spouse name: Not on file   Number of children: 3   Years of education: Not on file   Highest education level: Master's degree (e.g., MA, MS, MEng, MEd, MSW, MBA)  Occupational History   Occupation: retired Social research officer, government  Tobacco Use   Smoking status: Former    Types: Cigarettes    Quit date: 09/03/1961    Years since quitting: 61.2   Smokeless tobacco: Never   Tobacco comments:    10 months  Vaping Use   Vaping Use: Never used  Substance and Sexual Activity   Alcohol  use: No    Alcohol /week: 0.0 standard drinks of alcohol    Drug use: No   Sexual activity: Not Currently    Birth control/protection: Post-menopausal  Other Topics Concern   Not on file  Social History  Narrative   Lives with daughter.  ED syndrome   Social Determinants of Health   Financial Resource Strain: Low Risk  (01/07/2020)   Overall Financial Resource Strain (CARDIA)    Difficulty of Paying Living Expenses: Not hard at all  Food Insecurity: No Food Insecurity (01/07/2020)   Hunger Vital Sign    Worried About Running Out of Food in the Last Year: Never true    Ran Out of Food in the Last Year: Never true  Transportation Needs: No Transportation Needs (01/07/2020)   PRAPARE - Administrator, Civil Service (Medical): No    Lack of Transportation (Non-Medical): No  Physical Activity: Inactive (01/07/2020)   Exercise Vital Sign    Days of Exercise per Week: 0 days    Minutes of Exercise per Session: 0 min  Stress: No Stress Concern Present (01/07/2020)   [employer] of Occupational Health - Occupational Stress Questionnaire    Feeling of Stress : Only a little  Social Connections: Socially Isolated (01/07/2020)   Social Connection and Isolation Panel [NHANES]    Frequency of Communication with Friends and Family: More than three times a week    Frequency of Social Gatherings with Friends and Family: Never    Attends Religious Services: Never    Database administrator or Organizations: No    Attends Banker Meetings: Never    Marital Status: Divorced  Catering manager Violence: Not At Risk (01/07/2020)   Humiliation, Afraid, Rape, and Kick questionnaire    Fear of Current or Ex-Partner: No    Emotionally Abused: No    Physically Abused: No    Sexually Abused: No     Current Outpatient Medications:    albuterol  (VENTOLIN  HFA) 108 (90 Base) MCG/ACT inhaler, Inhale 2 puffs into the lungs every 6 (six) hours as needed for shortness of breath or wheezing., Disp: , Rfl:    aspirin EC 81 MG tablet, Take 81 mg by mouth daily. Swallow whole., Disp: , Rfl:    atorvastatin  (LIPITOR) 40 MG tablet, TAKE 1 TABLET BY MOUTH DAILY AT 6 PM., Disp: 90 tablet, Rfl: 0    budesonide (PULMICORT FLEXHALER) 180 MCG/ACT inhaler, Inhale 2 puffs into the lungs daily., Disp: , Rfl:    busPIRone (BUSPAR) 15 MG tablet, Take 30 mg by mouth 2 (two) times daily. 30 mg in  the morning and 30 mg in the evening, Disp: , Rfl:    Cholecalciferol 50 MCG (2000 UT) CAPS, Take by mouth as needed., Disp: , Rfl:    clopidogrel  (PLAVIX ) 75 MG tablet, TAKE 1 TABLET BY MOUTH EVERY DAY, Disp: 90 tablet, Rfl: 1   DULoxetine  (CYMBALTA ) 60 MG capsule, TAKE 1 CAPSULE BY MOUTH EVERY DAY, Disp: 90 capsule, Rfl: 3   gabapentin  (NEURONTIN ) 600 MG tablet, TAKE 2 TABLETS (1,200 MG TOTAL) BY MOUTH 2 (TWO) TIMES DAILY., Disp: 360 tablet, Rfl: 1   levothyroxine  (SYNTHROID ) 75 MCG tablet, TAKE ONE TABLET ON AN EMPTY STOMACH TWO DAYS A WEEK., Disp: 10 tablet, Rfl: 3   levothyroxine  (SYNTHROID ) 88 MCG tablet, TAKE ONE TABLET ON AN EMPTY STOMACH FIVE DAYS A WEEK., Disp: 60 tablet, Rfl: 1   Lidocaine , Anorectal, 5 % CREA, Apply topically BID prn, Disp: 38 g, Rfl: 1   loratadine (CLARITIN) 10 MG tablet, Take 10 mg by mouth daily., Disp: , Rfl:    Naproxen Sodium 220 MG CAPS, Take 220 mg by mouth 2 (two) times daily., Disp: , Rfl:    pantoprazole  (PROTONIX ) 40 MG tablet, TAKE 1 TABLET (40 MG TOTAL) BY MOUTH TWICE A DAY BEFORE MEALS, Disp: 180 tablet, Rfl: 0   polyethylene glycol powder (GLYCOLAX /MIRALAX ) powder, Take 255 g by mouth once., Disp: 255 g, Rfl: 0   temazepam (RESTORIL) 15 MG capsule, Take 15 mg by mouth at bedtime., Disp: , Rfl:      ROS:  Review of Systems  Constitutional:  Negative for fatigue, fever and unexpected weight change.  Respiratory:  Positive for cough. Negative for shortness of breath and wheezing.   Cardiovascular:  Negative for chest pain, palpitations and leg swelling.  Gastrointestinal:  Positive for nausea. Negative for blood in stool, constipation, diarrhea and vomiting.  Endocrine: Negative for cold intolerance, heat intolerance and polyuria.  Genitourinary:  Negative for  dyspareunia, dysuria, flank pain, frequency, genital sores, hematuria, menstrual problem, pelvic pain, urgency, vaginal bleeding, vaginal discharge and vaginal pain.  Musculoskeletal:  Positive for arthralgias. Negative for back pain, joint swelling and myalgias.  Skin:  Negative for rash.  Neurological:  Negative for dizziness, syncope, light-headedness, numbness and headaches.  Hematological:  Negative for adenopathy.  Psychiatric/Behavioral:  Positive for agitation and dysphoric mood. Negative for confusion, sleep disturbance and suicidal ideas. The patient is not nervous/anxious.    BREAST: No symptoms    Objective: BP 90/66   Ht 5' 2.5 (1.588 m)   Wt 163 lb (73.9 kg)   BMI 29.34 kg/m    Physical Exam Constitutional:      Appearance: She is well-developed.  Genitourinary:     Vulva normal.     Right Labia: No rash, tenderness or lesions.    Left Labia: No tenderness, lesions or rash.    No vaginal discharge, erythema or tenderness.      Right Adnexa: not tender and no mass present.    Left Adnexa: not tender and no mass present.    No cervical friability or polyp.     Uterus is not enlarged or tender.  Breasts:    Right: No mass, nipple discharge, skin change or tenderness.     Left: No mass, nipple discharge, skin change or tenderness.  Neck:     Thyroid : No thyromegaly.  Cardiovascular:     Rate and Rhythm: Normal rate and regular rhythm.     Heart sounds: Normal heart sounds. No murmur heard. Pulmonary:     Effort:  Pulmonary effort is normal.     Breath sounds: Normal breath sounds.  Abdominal:     Palpations: Abdomen is soft.     Tenderness: There is no abdominal tenderness. There is no guarding or rebound.  Musculoskeletal:        General: Normal range of motion.     Cervical back: Normal range of motion.  Lymphadenopathy:     Cervical: No cervical adenopathy.  Neurological:     General: No focal deficit present.     Mental Status: She is alert and  oriented to person, place, and time.     Cranial Nerves: No cranial nerve deficit.  Skin:    General: Skin is warm and dry.  Psychiatric:        Mood and Affect: Mood normal.        Behavior: Behavior normal.        Thought Content: Thought content normal.        Judgment: Judgment normal.  Vitals reviewed.    Assessment/Plan:  Encounter for annual routine gynecological examination  Cervical cancer screening - Plan: Cytology - PAP; per pt request due to FH in mom. Discussed cx cancer not genetic.   Encounter for screening mammogram for malignant neoplasm of breast; pt is current on mammo  Family history of breast cancer--doesn't meet Medicare guidelines for cancer genetic testing.           GYN counsel breast self exam, mammography screening, menopause, adequate intake of calcium  and vitamin D , diet and exercise    F/U  Return if symptoms worsen or fail to improve.  Shaft Corigliano B. Mann Skaggs, PA-C 12/04/2022 4:11 PM

## 2022-12-04 ENCOUNTER — Encounter: Payer: Self-pay | Admitting: Obstetrics and Gynecology

## 2022-12-04 ENCOUNTER — Other Ambulatory Visit (HOSPITAL_COMMUNITY)
Admission: RE | Admit: 2022-12-04 | Discharge: 2022-12-04 | Disposition: A | Discharge status: Home / Self Care | Payer: Medicare Other | Source: Ambulatory Visit | Attending: Obstetrics and Gynecology | Admitting: Obstetrics and Gynecology

## 2022-12-04 ENCOUNTER — Telehealth: Payer: Self-pay | Admitting: *Deleted

## 2022-12-04 ENCOUNTER — Ambulatory Visit (INDEPENDENT_AMBULATORY_CARE_PROVIDER_SITE_OTHER): Payer: Medicare Other | Admitting: Obstetrics and Gynecology

## 2022-12-04 VITALS — BP 90/66 | Ht 62.5 in | Wt 163.0 lb

## 2022-12-04 DIAGNOSIS — Z01419 Encounter for gynecological examination (general) (routine) without abnormal findings: Secondary | ICD-10-CM

## 2022-12-04 DIAGNOSIS — Z124 Encounter for screening for malignant neoplasm of cervix: Secondary | ICD-10-CM | POA: Insufficient documentation

## 2022-12-04 DIAGNOSIS — Z1231 Encounter for screening mammogram for malignant neoplasm of breast: Secondary | ICD-10-CM

## 2022-12-04 DIAGNOSIS — Z803 Family history of malignant neoplasm of breast: Secondary | ICD-10-CM

## 2022-12-04 NOTE — Telephone Encounter (Signed)
   Telephone encounter was:  Successful.  12/04/2022 Name: DARWIN CALDRON MRN: [id number] DOB: 04-04-1941  [name] is a 82 y.o. year old female who is a primary care patient of Bacigalupo, Dionne Bucy, MD . The community resource team was consulted for assistance with Transportation Needs   Care guide performed the following interventions: Follow up call placed to the patient to discuss status of referral. patient needing transportation to the dr provided thn transportation line  Follow Up Plan:  No further follow up planned at this time. The patient has been provided with needed resources.  [place] [phone] [address] , [place] [address] Email : [email or web address]

## 2022-12-04 NOTE — Patient Instructions (Signed)
I value your feedback and you entrusting us with your care. If you get a [facility] patient survey, I would appreciate you taking the time to let us know about your experience today. Thank you! ? ? ?

## 2022-12-05 ENCOUNTER — Telehealth: Payer: Self-pay | Admitting: *Deleted

## 2022-12-05 NOTE — Telephone Encounter (Signed)
   Telephone encounter was:  Successful.  12/05/2022 Name: Nichole [name] MRN: [id number] DOB: [birth year]  [name] is a 82 y.o. year old female who is a primary care patient of Bacigalupo, Dionne Bucy, MD . The community resource team was consulted for assistance with Transportation Needs   Care guide performed the following interventions: Patient provided with information about care guide support team and interviewed to confirm resource needs. Patient requested transportation to appts not established but daughter in [facility] needs to be with her to observe for safety reasons , patient has not scheduled appts yet provided thn transportation line but will follow up ablout daughter being able to ride  Follow Up Plan:  Care guide will follow up with patient by phone over the next days [place] [address] , [place] [address] Email : [email or web address]

## 2022-12-06 ENCOUNTER — Telehealth: Payer: Self-pay

## 2022-12-06 ENCOUNTER — Telehealth: Payer: Self-pay | Admitting: *Deleted

## 2022-12-06 LAB — CYTOLOGY - PAP: Diagnosis: NEGATIVE

## 2022-12-06 NOTE — Telephone Encounter (Signed)
   Telephone encounter was:  Unsuccessful.  12/06/2022 Name: Nichole [name] MRN: [id number] DOB: 1941/08/24  Unsuccessful outbound call made today to assist with:  Transportation Needs   Outreach Attempt:  1st Attempt  Unable to leave message voicemail is full.  [place] Resource Care Guide   [email or web address]  ?? [phone]   Website: [email or web address]  [email or web address]

## 2022-12-06 NOTE — Telephone Encounter (Signed)
   Telephone encounter was:  Successful.  12/06/2022 Name: WHITTLEY JURKIEWICZ MRN: [id number] DOB: [birth year]  [name] is a 82 y.o. year old female who is a primary care patient of Bacigalupo, Dionne Bucy, MD . The community resource team was consulted for assistance with Transportation Needs   Care guide performed the following interventions: Follow up call placed to the patient to discuss status of referral. Patient asked to call back when ready to schedule transportation for upcoming procedures provided call back information to schedule rides for upcoming scheduled appts  Follow Up Plan:  No further follow up planned at this time. The patient has been provided with needed resources.  [place] [phone] [address] , [place] [address] Email : [email or web address]

## 2022-12-06 NOTE — Telephone Encounter (Signed)
   Telephone encounter was:  Successful.  12/06/2022 Name: NYSSA PISCITELLI MRN: [id number] DOB: 08-30-1941  [name] is a 82 y.o. year old female who is a primary care patient of Brita Romp, Dionne Bucy, MD . The community resource team was consulted for assistance with Transportation Needs  Patient throught she had an appt to schedule but it is not until may so she will call back when she needs assistance with transportation  Care guide performed the following interventions: Follow up call placed to the patient to discuss status of referral.  Follow Up Plan:  No further follow up planned at this time. The patient has been provided with needed resources.  [place] [phone] [address] , [place] [address] Email : [email or web address] [email or web address]

## 2022-12-07 ENCOUNTER — Ambulatory Visit: Payer: Medicare Other | Admitting: Neurology

## 2022-12-14 ENCOUNTER — Other Ambulatory Visit: Payer: Self-pay | Admitting: Orthopaedic Surgery

## 2022-12-14 DIAGNOSIS — M79671 Pain in right foot: Secondary | ICD-10-CM

## 2022-12-14 DIAGNOSIS — M25571 Pain in right ankle and joints of right foot: Secondary | ICD-10-CM | POA: Diagnosis not present

## 2022-12-19 ENCOUNTER — Other Ambulatory Visit: Payer: Medicare Other
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
## 2022-12-26 ENCOUNTER — Ambulatory Visit (INDEPENDENT_AMBULATORY_CARE_PROVIDER_SITE_OTHER): Payer: Medicare Other | Admitting: Neurology

## 2022-12-26 ENCOUNTER — Encounter: Payer: Self-pay | Admitting: Neurology

## 2022-12-26 DIAGNOSIS — G43009 Migraine without aura, not intractable, without status migrainosus: Secondary | ICD-10-CM

## 2022-12-26 MED ORDER — DULOXETINE HCL 60 MG PO CPEP: ORAL_CAPSULE | ORAL | 3 refills | Status: DC

## 2022-12-26 NOTE — Patient Instructions (Signed)
Refilled duloxetine

## 2022-12-28 ENCOUNTER — Ambulatory Visit
Admission: RE | Admit: 2022-12-28 | Discharge: 2022-12-28 | Disposition: A | Discharge status: Home / Self Care | Payer: Medicare Other | Source: Ambulatory Visit | Attending: Orthopaedic Surgery | Admitting: Orthopaedic Surgery

## 2022-12-28 DIAGNOSIS — M79671 Pain in right foot: Secondary | ICD-10-CM | POA: Diagnosis not present

## 2023-01-02 DIAGNOSIS — M19071 Primary osteoarthritis, right ankle and foot: Secondary | ICD-10-CM | POA: Diagnosis not present

## 2023-01-02 DIAGNOSIS — M25571 Pain in right ankle and joints of right foot: Secondary | ICD-10-CM | POA: Diagnosis not present

## 2023-01-04 ENCOUNTER — Other Ambulatory Visit: Payer: Self-pay | Admitting: Family Medicine

## 2023-01-10 DIAGNOSIS — L910 Hypertrophic scar: Secondary | ICD-10-CM | POA: Diagnosis not present

## 2023-01-10 DIAGNOSIS — H029 Unspecified disorder of eyelid: Secondary | ICD-10-CM | POA: Diagnosis not present

## 2023-01-10 DIAGNOSIS — Z9889 Other specified postprocedural states: Secondary | ICD-10-CM | POA: Diagnosis not present

## 2023-01-16 NOTE — Progress Notes (Unsigned)
I,[name],acting as a Neurosurgeon for [name], MD.,have documented all relevant documentation on the behalf of [name], MD,as directed by  [name], MD while in the presence of [name], MD.    Annual Wellness Visit     Patient: Nichole [name], Female    DOB: 07/29/1941, 82 y.o.   MRN: [id number] Visit Date: 01/17/2023  Today's Provider: [name], MD   No chief complaint on file.  Subjective    Nichole [name] is a 82 y.o. female who presents today for her Annual Wellness Visit. She reports consuming a {diet types:17450} diet. {Exercise:19826} She generally feels {well/fairly well/poorly:18703}. She reports sleeping {well/fairly well/poorly:18703}. She {does/does not:200015} have additional problems to discuss today.   HPI   Medications: Outpatient Medications Prior to Visit  Medication Sig   albuterol (VENTOLIN HFA) 108 (90 Base) MCG/ACT inhaler Inhale 2 puffs into the lungs every 6 (six) hours as needed for shortness of breath or wheezing.   amoxicillin (AMOXIL) 500 MG tablet Take 500 mg by mouth 3 (three) times daily.   aspirin EC 81 MG tablet Take 81 mg by mouth daily. Swallow whole.   atorvastatin (LIPITOR) 40 MG tablet TAKE 1 TABLET BY MOUTH DAILY AT 6 PM.   budesonide (PULMICORT FLEXHALER) 180 MCG/ACT inhaler Inhale 2 puffs into the lungs daily.   busPIRone (BUSPAR) 15 MG tablet Take 30 mg by mouth 2 (two) times daily. 30 mg in the morning and 30 mg in the evening   Cholecalciferol 50 MCG (2000 UT) CAPS Take by mouth as needed.   clopidogrel (PLAVIX) 75 MG tablet TAKE 1 TABLET BY MOUTH EVERY DAY   DULoxetine (CYMBALTA) 60 MG capsule TAKE 1 CAPSULE BY MOUTH EVERY DAY   gabapentin (NEURONTIN) 600 MG tablet TAKE 2 TABLETS (1,200 MG TOTAL) BY MOUTH 2 (TWO) TIMES DAILY.   levothyroxine (SYNTHROID) 75 MCG tablet TAKE ONE TABLET ON AN EMPTY STOMACH TWO DAYS A WEEK.   levothyroxine (SYNTHROID) 88 MCG tablet TAKE ONE TABLET  ON AN EMPTY STOMACH FIVE DAYS A WEEK.   Lidocaine, Anorectal, 5 % CREA Apply topically BID prn   loratadine (CLARITIN) 10 MG tablet Take 10 mg by mouth daily.   Naproxen Sodium 220 MG CAPS Take 220 mg by mouth 2 (two) times daily.   pantoprazole (PROTONIX) 40 MG tablet TAKE 1 TABLET (40 MG TOTAL) BY MOUTH TWICE A DAY BEFORE MEALS   polyethylene glycol powder (GLYCOLAX/MIRALAX) powder Take 255 g by mouth once.   temazepam (RESTORIL) 15 MG capsule Take 15 mg by mouth at bedtime.   No facility-administered medications prior to visit.    Allergies  Allergen Reactions   Ciprocinonide [Fluocinolone] Diarrhea   Ciprofloxacin    Flexeril [Cyclobenzaprine] Swelling   Lyrica [Pregabalin] Swelling   Other Other (See Comments)    Childhood reaction, uncertain what it was   Tery Sanfilippo Hcl] Swelling   Sulfa Antibiotics     Unknown  Childhood reaction, uncertain what it was   Sulfamethoxazole     Reaction occurred as a child, pt doesn't know what the reaction was   Keflex [Cephalexin] Rash    Patient Care Team: Erasmo Downer, MD as PCP - General (Family Medicine) Drema Dallas, DO as Consulting Physician (Neurology) Archer Asa, MD as Consulting Physician (Psychiatry) Lemar Livings, Merrily Pew, MD as Consulting Physician (General Surgery) Claria Dice, MD as Attending Physician (Physical Medicine and Rehabilitation) Marcene Corning, MD as Consulting Physician (Orthopedic Surgery)  Review of Systems  All  other systems reviewed and are negative.   Last CBC Lab Results  Component Value Date   WBC 10.5 11/30/2022   HGB 14.8 11/30/2022   HCT 42.0 11/30/2022   MCV 91 11/30/2022   MCH 32.1 11/30/2022   RDW 12.4 11/30/2022   PLT 244 11/30/2022   Last metabolic panel Lab Results  Component Value Date   GLUCOSE 89 11/30/2022   NA 143 11/30/2022   K 4.6 11/30/2022   CL 102 11/30/2022   CO2 25 11/30/2022   BUN 14 11/30/2022   CREATININE 0.70 11/30/2022   EGFR 88  11/30/2022   CALCIUM 9.9 11/30/2022   PROT 7.0 11/30/2022   ALBUMIN 4.8 11/30/2022   LABGLOB 2.2 11/30/2022   AGRATIO 2.2 11/30/2022   BILITOT 0.4 11/30/2022   ALKPHOS 139 (H) 11/30/2022   AST 20 11/30/2022   ALT 21 11/30/2022   ANIONGAP 7 02/19/2022   Last lipids Lab Results  Component Value Date   CHOL 155 01/15/2022   HDL 43 01/15/2022   LDLCALC 80 01/15/2022   TRIG 188 (H) 01/15/2022   CHOLHDL 3.1 01/12/2021   Last hemoglobin A1c No results found for: "HGBA1C" Last thyroid functions Lab Results  Component Value Date   TSH 1.810 01/15/2022   Last vitamin D Lab Results  Component Value Date   VD25OH 35.4 01/12/2021   Last vitamin B12 and Folate Lab Results  Component Value Date   VITAMINB12 538 02/13/2018        Objective    Vitals: There were no vitals taken for this visit. BP Readings from Last 3 Encounters:  12/26/22 (!) 146/66  12/04/22 90/66  11/30/22 (!) 141/75   Wt Readings from Last 3 Encounters:  12/26/22 164 lb 12.8 oz (74.8 kg)  12/04/22 163 lb (73.9 kg)  11/30/22 162 lb 9.6 oz (73.8 kg)       Physical Exam ***  Most recent functional status assessment:    11/30/2022    1:49 PM  In your present state of health, do you have any difficulty performing the following activities:  Hearing? 0  Vision? 1  Difficulty concentrating or making decisions? 1  Walking or climbing stairs? 1  Dressing or bathing? 0  Doing errands, shopping? 0   Most recent fall risk assessment:    12/26/2022    1:31 PM  Fall Risk   Falls in the past year? 0  Number falls in past yr: 0  Injury with Fall? 0  Follow up Falls evaluation completed    Most recent depression screenings:    11/30/2022    1:48 PM 02/12/2022    2:35 PM  PHQ 2/9 Scores  PHQ - 2 Score 1 4  PHQ- 9 Score 11 16   Most recent cognitive screening:    01/15/2022    2:16 PM  6CIT Screen  What Year? 0 points  What month? 0 points  What time? 0 points  Count back from 20 0 points   Months in reverse 0 points  Repeat phrase 0 points  Total Score 0 points   Most recent Audit-C alcohol use screening    11/30/2022    1:49 PM  Alcohol Use Disorder Test (AUDIT)  1. How often do you have a drink containing alcohol? 0   A score of 3 or more in women, and 4 or more in men indicates increased risk for alcohol abuse, EXCEPT if all of the points are from question 1   No results found for any visits  on 01/17/23.  Assessment & Plan     Annual wellness visit done today including the all of the following: Reviewed patient's Family Medical History Reviewed and updated list of patient's medical providers Assessment of cognitive impairment was done Assessed patient's functional ability Established a written schedule for health screening services Health Risk Assessent Completed and Reviewed  Exercise Activities and Dietary recommendations  Goals      DIET - INCREASE WATER INTAKE     Recommend to remove any items from the home that may cause slips or trips.        Immunization History  Administered Date(s) Administered   Fluad Quad(high Dose 65+) 06/14/2020   Influenza, High Dose Seasonal PF 06/12/2017, 04/24/2019   PFIZER(Purple Top)SARS-COV-2 Vaccination 10/01/2019, 10/23/2019, 06/14/2020   Pneumococcal Conjugate-13 06/12/2017   Pneumococcal Polysaccharide-23 09/01/2012   Tdap 11/27/2016   Zoster Recombinat (Shingrix) 06/12/2017    Health Maintenance  Topic Date Due   Zoster Vaccines- Shingrix (2 of 2) 08/07/2017   COVID-19 Vaccine (4 - 2023-24 season) 05/04/2022   Medicare Annual Wellness (AWV)  01/16/2023   INFLUENZA VACCINE  04/04/2023   DTaP/Tdap/Td (2 - Td or Tdap) 11/28/2026   Pneumonia Vaccine 58+ Years old  Completed   DEXA SCAN  Completed   Hepatitis C Screening  Completed   HPV VACCINES  Aged Out   COLONOSCOPY (Pts 45-49yrs Insurance coverage will need to be confirmed)  Discontinued     Discussed health benefits of physical activity, and  encouraged her to engage in regular exercise appropriate for her age and condition.    ***  No follow-ups on file.     {provider attestation***:1}   [name], MD  [facility] [phone] (phone) [phone] (fax)  [facility]

## 2023-01-17 ENCOUNTER — Other Ambulatory Visit: Payer: Self-pay | Admitting: Family Medicine

## 2023-01-17 ENCOUNTER — Ambulatory Visit (INDEPENDENT_AMBULATORY_CARE_PROVIDER_SITE_OTHER): Payer: Medicare Other | Admitting: Family Medicine

## 2023-01-17 ENCOUNTER — Encounter: Payer: Self-pay | Admitting: Family Medicine

## 2023-01-17 VITALS — BP 119/69 | HR 71 | Temp 98.2°F | Resp 12 | Ht 62.5 in | Wt 162.7 lb

## 2023-01-17 DIAGNOSIS — Z0000 Encounter for general adult medical examination without abnormal findings: Secondary | ICD-10-CM | POA: Diagnosis not present

## 2023-01-17 DIAGNOSIS — K760 Fatty (change of) liver, not elsewhere classified: Secondary | ICD-10-CM | POA: Diagnosis not present

## 2023-01-17 DIAGNOSIS — E039 Hypothyroidism, unspecified: Secondary | ICD-10-CM

## 2023-01-17 DIAGNOSIS — D649 Anemia, unspecified: Secondary | ICD-10-CM

## 2023-01-17 DIAGNOSIS — M810 Age-related osteoporosis without current pathological fracture: Secondary | ICD-10-CM

## 2023-01-17 DIAGNOSIS — E7800 Pure hypercholesterolemia, unspecified: Secondary | ICD-10-CM

## 2023-01-17 MED ORDER — PANTOPRAZOLE SODIUM 40 MG PO TBEC: DELAYED_RELEASE_TABLET | ORAL | 2 refills | Status: DC

## 2023-01-17 MED ORDER — GABAPENTIN 600 MG PO TABS: 1200.0000 mg | ORAL_TABLET | Freq: Two times a day (BID) | ORAL | 1 refills | Status: DC

## 2023-01-17 MED ORDER — ATORVASTATIN CALCIUM 40 MG PO TABS: 40.0000 mg | ORAL_TABLET | Freq: Every day | ORAL | 2 refills | Status: DC

## 2023-01-17 MED ORDER — LEVOTHYROXINE SODIUM 88 MCG PO TABS: ORAL_TABLET | ORAL | 1 refills | Status: DC

## 2023-01-17 NOTE — Assessment & Plan Note (Signed)
Previously well controlled Continue statin Repeat FLP and CMP  

## 2023-01-17 NOTE — Assessment & Plan Note (Signed)
Discussed lifestyle changes Dietary handout given Recheck LFTs

## 2023-01-17 NOTE — Assessment & Plan Note (Signed)
Previously well controlled Continue Synthroid at current dose  Recheck TSH and adjust Synthroid as indicated   

## 2023-01-18 LAB — LIPID PANEL WITH LDL/HDL RATIO
Cholesterol, Total: 135 mg/dL (ref 100–199)
HDL: 54 mg/dL (ref >39)
LDL Chol Calc (NIH): 65 mg/dL (ref 0–99)
LDL/HDL Ratio: 1.2 ratio (ref 0.0–3.2)
Triglycerides: 85 mg/dL (ref 0–149)
VLDL Cholesterol Cal: 16 mg/dL (ref 5–40)

## 2023-01-18 LAB — COMPREHENSIVE METABOLIC PANEL
ALT: 16 IU/L (ref 0–32)
AST: 21 IU/L (ref 0–40)
Albumin/Globulin Ratio: 2.3 — ABNORMAL HIGH (ref 1.2–2.2)
Albumin: 4.4 g/dL (ref 3.8–4.8)
Alkaline Phosphatase: 127 IU/L — ABNORMAL HIGH (ref 44–121)
BUN/Creatinine Ratio: 18 (ref 12–28)
BUN: 14 mg/dL (ref 8–27)
Bilirubin Total: 0.4 mg/dL (ref 0.0–1.2)
CO2: 24 mmol/L (ref 20–29)
Calcium: 9.1 mg/dL (ref 8.7–10.3)
Chloride: 105 mmol/L (ref 96–106)
Creatinine, Ser: 0.76 mg/dL (ref 0.57–1.00)
Globulin, Total: 1.9 g/dL (ref 1.5–4.5)
Glucose: 77 mg/dL (ref 70–99)
Potassium: 4.2 mmol/L (ref 3.5–5.2)
Sodium: 141 mmol/L (ref 134–144)
Total Protein: 6.3 g/dL (ref 6.0–8.5)
eGFR: 80 mL/min/{1.73_m2} (ref >59)

## 2023-01-18 LAB — TSH: TSH: 0.267 u[IU]/mL — ABNORMAL LOW (ref 0.450–4.500)

## 2023-01-24 ENCOUNTER — Telehealth: Payer: Self-pay

## 2023-01-24 DIAGNOSIS — E039 Hypothyroidism, unspecified: Secondary | ICD-10-CM

## 2023-01-24 MED ORDER — LEVOTHYROXINE SODIUM 88 MCG PO TABS: ORAL_TABLET | ORAL | 2 refills | Status: DC

## 2023-01-24 MED ORDER — LEVOTHYROXINE SODIUM 75 MCG PO TABS
ORAL_TABLET | ORAL | 2 refills | Status: DC
Start: 2023-01-24 — End: 2023-06-17

## 2023-01-24 NOTE — Telephone Encounter (Signed)
-----   Message from Erasmo Downer, MD sent at 01/18/2023  8:03 AM EDT ----- Normal/stable labs, except low TSH. This means that synthroid dose is slightly too high. Change levothyroxine dose alternating 88 mcg and 75 mcg every other day. Ok to send new prescriptions to reflect the change. Recheck TSH in 2 months.  Improvement in liver numbers.

## 2023-01-24 NOTE — Telephone Encounter (Signed)
-----   Message from Angela M Bacigalupo, MD sent at 01/18/2023  8:03 AM EDT ----- Normal/stable labs, except low TSH. This means that synthroid dose is slightly too high. Change levothyroxine dose alternating 88 mcg and 75 mcg every other day. Ok to send new prescriptions to reflect the change. Recheck TSH in 2 months.  Improvement in liver numbers. 

## 2023-02-15 ENCOUNTER — Encounter: Payer: Self-pay | Admitting: Cardiology

## 2023-02-15 ENCOUNTER — Ambulatory Visit: Payer: Medicare Other | Attending: Cardiology | Admitting: Cardiology

## 2023-02-15 VITALS — BP 110/66 | HR 68 | Ht 62.0 in | Wt 163.4 lb

## 2023-02-15 DIAGNOSIS — I2510 Atherosclerotic heart disease of native coronary artery without angina pectoris: Secondary | ICD-10-CM | POA: Diagnosis not present

## 2023-02-15 DIAGNOSIS — R002 Palpitations: Secondary | ICD-10-CM | POA: Diagnosis not present

## 2023-02-15 DIAGNOSIS — E039 Hypothyroidism, unspecified: Secondary | ICD-10-CM | POA: Insufficient documentation

## 2023-02-15 DIAGNOSIS — R55 Syncope and collapse: Secondary | ICD-10-CM

## 2023-02-15 DIAGNOSIS — Z01810 Encounter for preprocedural cardiovascular examination: Secondary | ICD-10-CM | POA: Diagnosis not present

## 2023-02-15 DIAGNOSIS — Z8673 Personal history of transient ischemic attack (TIA), and cerebral infarction without residual deficits: Secondary | ICD-10-CM | POA: Diagnosis not present

## 2023-02-15 DIAGNOSIS — E782 Mixed hyperlipidemia: Secondary | ICD-10-CM

## 2023-02-15 NOTE — Patient Instructions (Signed)
Medication Instructions:  Your physician recommends that you continue on your current medications as directed. Please refer to the Current Medication list given to you today.  *If you need a refill on your cardiac medications before your next appointment, please call your pharmacy*  Lab Work: -None ordered  Testing/Procedures: -None ordered  Follow-Up: At [facility], you and your health needs are our priority.  As part of our continuing mission to provide you with exceptional heart care, we have created designated Provider Care Teams.  These Care Teams include your primary Cardiologist (physician) and Advanced Practice Providers (APPs -  Physician Assistants and Nurse Practitioners) who all work together to provide you with the care you need, when you need it.  Your next appointment:   6 month(s)  Provider:   Debbe Odea, MD    Other Instructions -None

## 2023-02-15 NOTE — Progress Notes (Unsigned)
  Cardiology Office Note:  .   Date:  02/15/2023  ID:  Nichole [name], DOB [birth year], MRN [id number] PCP: Sherlyn Hay, DO  [facility] Providers Cardiologist:  Debbe Odea, MD { Click to update primary MD,subspecialty MD or APP then REFRESH:1}   History of Present Illness: .   Nichole [name] is a 82 y.o. female with past medical history of hyperlipidemia, TIA 44, hypothyroidism, near syncope and atypical chest pain, who is here today for follow-up.  She previously undergone echocardiogram which revealed LVEF 60 to 65%.  Coronary CTA was obtained due to to symptoms of chest pain.  She had a cardiac monitor that was placed that shows occasional SVTs with no other significant arrhythmias.  She was last seen in clinic 04/20/2022 by Dr.Agbor-Etang.  At that time she had new cardiac concerns and was following up on her previous ZIO monitor.  There were no medication changes that were made and no further testing that was ordered at that time.  With her near syncope with low blood pressure with adequate hydration can consider midodrine.  She returns to clinic today    ROS: ***  Studies Reviewed: Marland Kitchen    EKG: Sinus rhythm rate of 68 first-degree AV block  TTE 03/20/22 1. Left ventricular ejection fraction, by estimation, is 60 to 65%. The  left ventricle has normal function. The left ventricle has no regional  wall motion abnormalities. Left ventricular diastolic parameters are  consistent with Grade II diastolic  dysfunction (pseudonormalization).   2. Right ventricular systolic function is normal. The right ventricular  size is normal. Tricuspid regurgitation signal is inadequate for assessing  PA pressure.   3. The mitral valve is normal in structure. Mild mitral valve  regurgitation. No evidence of mitral stenosis.   4. The aortic valve was not well visualized. Aortic valve regurgitation  is not visualized. No aortic stenosis is present.   5. The inferior vena cava  is normal in size with greater than 50%  respiratory variability, suggesting right atrial pressure of 3 mmHg.   Coronary CTA 02/22/22 IMPRESSION: 1. Coronary calcium score of 242. This was 70th percentile for age and sex matched control.   2. Normal coronary origin with right dominance.   3. 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   4. CAD-RADS 3. Moderate stenosis. Consider symptom-guided anti-ischemic pharmacotherapy as well as risk factor modification per guideline directed care. Risk Assessment/Calculations:             Physical Exam:   VS:  BP 110/66   Pulse 68   Ht 5\' 2"  (1.575 m)   Wt 163 lb 6.4 oz (74.1 kg)   SpO2 97%   BMI 29.89 kg/m    Wt Readings from Last 3 Encounters:  02/15/23 163 lb 6.4 oz (74.1 kg)  01/17/23 162 lb 11.2 oz (73.8 kg)  12/26/22 164 lb 12.8 oz (74.8 kg)    GEN: Well nourished, well developed in no acute distress NECK: No JVD; No carotid bruits CARDIAC: ***RRR, no murmurs, rubs, gallops RESPIRATORY:  Clear to auscultation without rales, wheezing or rhonchi  ABDOMEN: Soft, non-tender, non-distended EXTREMITIES:  No edema; No deformity   ASSESSMENT AND PLAN: .   ***    {Are you ordering a CV Procedure (e.g. stress test, cath, DCCV, TEE, etc)?   Press F2        :[id number]}  Dispo: ***  Signed, Jia Dottavio, NP

## 2023-02-19 DIAGNOSIS — Z0279 Encounter for issue of other medical certificate: Secondary | ICD-10-CM

## 2023-02-20 ENCOUNTER — Telehealth: Payer: Self-pay | Admitting: Family Medicine

## 2023-02-20 DIAGNOSIS — E039 Hypothyroidism, unspecified: Secondary | ICD-10-CM

## 2023-02-20 NOTE — Telephone Encounter (Signed)
Pt had labs done a month or so ago.  Pt states she went to see her cardiologist and cardiologist mentioned her TSH was extremely low.  Cardiologist recommended she follow up with her PCP, per the patient.   She was seeing Dr. Beryle Flock and wanted to transfer to Dr. Payton Mccallum.

## 2023-02-22 ENCOUNTER — Encounter: Payer: Self-pay | Admitting: Family Medicine

## 2023-03-02 ENCOUNTER — Other Ambulatory Visit: Payer: Self-pay | Admitting: Family Medicine

## 2023-03-04 ENCOUNTER — Telehealth: Payer: Self-pay | Admitting: Family Medicine

## 2023-03-04 DIAGNOSIS — F331 Major depressive disorder, recurrent, moderate: Secondary | ICD-10-CM | POA: Diagnosis not present

## 2023-03-04 NOTE — Telephone Encounter (Signed)
Dois Davenport with [facility] is calling in because they faxed over a form for surgical clearance. [facility] is also requesting an update on when the pt needs to discontinue clopidogrel (PLAVIX) 75 MG tablet [[id number]] . Dois Davenport is requesting a callback regarding this matter.  [phone]

## 2023-03-05 ENCOUNTER — Telehealth: Payer: Self-pay | Admitting: *Deleted

## 2023-03-05 NOTE — Telephone Encounter (Signed)
Printed scanned copy of form faxed latter half of last month; re-faxed form to [employer].

## 2023-03-05 NOTE — Telephone Encounter (Signed)
Telephone encounter was:  Successful.  03/05/2023 Name: Nichole [name] MRN: [id number] DOB: 08-Jan-1941  Nichole [name] is a 82 y.o. year old female who is a primary care patient of Sherlyn Hay, DO . The community resource team was consulted for assistance with Transportation Needs   Care guide performed the following interventions: Patient provided with information about care guide support team and interviewed to confirm resource needs.Patient going for knee surgery and needing transportation for the appt   Follow Up Plan:  No further follow up planned at this time. The patient has been provided with needed resources.  Nichole [name] -[facility], Population [facility] [phone] [address] [place] , [place] [address] Email : Nichole [name] [email or web address] .Nichole [name] DOB: Jul 25, 1941 MRN: [id number]   Nichole WAIVER AND RELEASE OF LIABILITY  For purposes of improving physical access to our facilities, [facility] is pleased to partner with third parties to provide [facility] [place] patients or other authorized individuals the option of convenient, on-demand ground transportation [name] (the [employer]") through use of the technology service that enables users to request on-demand ground transportation from independent third-party providers.  By opting to use and accept these [employer], I, the undersigned, hereby agree on behalf of myself, and on behalf of any minor child using the Science writer for whom I am the parent or legal guardian, as follows:  Science writer provided to me are provided by independent third-party transportation providers who are not [employer] or employees and who are unaffiliated with [employer]. Nichole [name] is neither a transportation carrier nor a common or public carrier. Nichole [name] has no control over the quality or safety of the transportation that occurs as a result of the [employer]. [facility] cannot guarantee that any third-party transportation provider will complete any arranged transportation service. [facility] makes no representation, warranty, or guarantee regarding the reliability, timeliness, quality, safety, suitability, or availability of any of the Transport [name] or that they will be error free. I fully understand that traveling by vehicle involves risks and dangers of serious bodily injury, including permanent disability, paralysis, and death. I agree, on behalf of myself and on behalf of any minor child using the Transport [name] for whom I am the parent or legal guardian, that the entire risk arising out of my use of the [employer] remains solely with me, to the maximum extent permitted under applicable law. The [employer] are provided "as is" and "as available." [facility] disclaims all representations and warranties, express, implied or statutory, not expressly set out in these terms, including the implied warranties of merchantability and fitness for a particular purpose. I hereby waive and release [facility], its agents, employees, officers, directors, representatives, insurers, attorneys, assigns, successors, subsidiaries, and affiliates from any and all past, present, or future claims, demands, liabilities, actions, causes of action, or suits of any kind directly or indirectly arising from acceptance and use of the [employer]. I further waive and release  and its affiliates from all present and future liability and responsibility for any injury or death to persons or damages to property caused by or related to the use of the [employer]. I have read this Waiver and Release of Liability, and I understand the terms used in it and their legal significance. This Waiver is freely and voluntarily given with the understanding that my right (as well as the right of any minor  child for whom I am the parent or legal  guardian using the Science writer) to legal recourse against [facility] in connection with the [employer] is knowingly surrendered in return for use of these [name].   I attest that I read the consent document to Nichole [name], gave Ms. Baldwin [name] the opportunity to ask questions and answered the questions asked (if any). I affirm that Nichole [name] then provided consent for she's participation in this program.     Nichole [name]

## 2023-03-08 DIAGNOSIS — E039 Hypothyroidism, unspecified: Secondary | ICD-10-CM | POA: Diagnosis not present

## 2023-03-09 LAB — TSH: TSH: 1.13 u[IU]/mL (ref 0.450–4.500)

## 2023-03-11 DIAGNOSIS — M25562 Pain in left knee: Secondary | ICD-10-CM | POA: Diagnosis not present

## 2023-03-11 DIAGNOSIS — R262 Difficulty in walking, not elsewhere classified: Secondary | ICD-10-CM | POA: Diagnosis not present

## 2023-03-11 DIAGNOSIS — R531 Weakness: Secondary | ICD-10-CM | POA: Diagnosis not present

## 2023-03-11 DIAGNOSIS — M25671 Stiffness of right ankle, not elsewhere classified: Secondary | ICD-10-CM | POA: Diagnosis not present

## 2023-03-12 DIAGNOSIS — M19071 Primary osteoarthritis, right ankle and foot: Secondary | ICD-10-CM | POA: Diagnosis not present

## 2023-03-12 DIAGNOSIS — G8918 Other acute postprocedural pain: Secondary | ICD-10-CM | POA: Diagnosis not present

## 2023-03-15 ENCOUNTER — Emergency Department
Admission: EM | Admit: 2023-03-15 | Discharge: 2023-03-15 | Disposition: A | Discharge status: Home / Self Care | Payer: Medicare Other | Attending: Emergency Medicine | Admitting: Emergency Medicine

## 2023-03-15 ENCOUNTER — Other Ambulatory Visit: Payer: Self-pay

## 2023-03-15 ENCOUNTER — Ambulatory Visit: Payer: Self-pay | Admitting: *Deleted

## 2023-03-15 DIAGNOSIS — N3000 Acute cystitis without hematuria: Secondary | ICD-10-CM | POA: Diagnosis not present

## 2023-03-15 DIAGNOSIS — R300 Dysuria: Secondary | ICD-10-CM | POA: Diagnosis present

## 2023-03-15 LAB — URINALYSIS, ROUTINE W REFLEX MICROSCOPIC
Bilirubin Urine: NEGATIVE
Glucose, UA: NEGATIVE mg/dL
Hgb urine dipstick: NEGATIVE
Ketones, ur: NEGATIVE mg/dL
Nitrite: NEGATIVE
Protein, ur: NEGATIVE mg/dL
Specific Gravity, Urine: 1.009 (ref 1.005–1.030)
WBC, UA: >50 WBC/hpf (ref 0–5)
pH: 7 (ref 5.0–8.0)

## 2023-03-15 MED ORDER — LIDOCAINE HCL (PF) 1 % IJ SOLN
INTRAMUSCULAR | Status: AC
  Filled 2023-03-15: qty 5

## 2023-03-15 MED ORDER — CEFTRIAXONE SODIUM 1 G IJ SOLR
1.0000 g | Freq: Once | INTRAMUSCULAR | Status: AC
  Administered 2023-03-15: 1 g via INTRAMUSCULAR
  Filled 2023-03-15: qty 10

## 2023-03-15 MED ORDER — CEFDINIR 300 MG PO CAPS: 300.0000 mg | ORAL_CAPSULE | Freq: Two times a day (BID) | ORAL | 0 refills | Status: AC

## 2023-03-15 NOTE — ED Provider Notes (Signed)
[facility] Provider Note  Patient Contact: 10:01 PM (approximate)   History   Urinary Frequency (Last night)   HPI  Nichole [name] is a 82 y.o. female presents to the emergency department with dysuria and increased urinary frequency for the past 2 days.  No hematuria, fever, chills, vomiting or nausea.  She denies a history of sepsis or pyelonephritis in the past.  No flank pain.      Physical Exam   Triage Vital Signs: ED Triage Vitals  Encounter Vitals Group     BP 03/15/23 2008 (!) 111/57     Systolic BP Percentile --      Diastolic BP Percentile --      Pulse Rate 03/15/23 2008 75     Resp 03/15/23 2008 16     Temp 03/15/23 2008 98.4 F (36.9 C)     Temp Source 03/15/23 2008 Oral     SpO2 03/15/23 2008 98 %     Weight 03/15/23 2009 165 lb (74.8 kg)     Height 03/15/23 2009 5\' 2"  (1.575 m)     Head Circumference --      Peak Flow --      Pain Score 03/15/23 2009 0     Pain Loc --      Pain Education --      Exclude from Growth Chart --     Most recent vital signs: Vitals:   03/15/23 2008 03/15/23 2125  BP: (!) 111/57 123/61  Pulse: 75 68  Resp: 16 18  Temp: 98.4 F (36.9 C)   SpO2: 98% 100%     General: Alert and in no acute distress. Eyes:  PERRL. EOMI. Head: No acute traumatic findings ENT:      Nose: No congestion/rhinnorhea.      Mouth/Throat: Mucous membranes are moist. Neck: No stridor. No cervical spine tenderness to palpation. Cardiovascular:  Good peripheral perfusion Respiratory: Normal respiratory effort without tachypnea or retractions. Lungs CTAB. Good air entry to the bases with no decreased or absent breath sounds. Gastrointestinal: Bowel sounds 4 quadrants. Soft and nontender to palpation. No guarding or rigidity. No palpable masses. No distention. No CVA tenderness. Musculoskeletal: Full range of motion to all extremities.  Neurologic:  No gross focal neurologic deficits are appreciated.  Skin:   No  rash noted Other:   ED Results / Procedures / Treatments   Labs (all labs ordered are listed, but only abnormal results are displayed) Labs Reviewed  URINALYSIS, ROUTINE W REFLEX MICROSCOPIC - Abnormal; Notable for the following components:      Result Value   Color, Urine YELLOW (*)    APPearance HAZY (*)    Leukocytes,Ua LARGE (*)    Bacteria, UA RARE (*)    All other components within normal limits        PROCEDURES:  Critical Care performed: No  Procedures   MEDICATIONS ORDERED IN ED: Medications  cefTRIAXone (ROCEPHIN) injection 1 g (1 g Intramuscular Given 03/15/23 2125)  lidocaine (PF) (XYLOCAINE) 1 % injection (  Given 03/15/23 2125)     IMPRESSION / MDM / ASSESSMENT AND PLAN / ED COURSE  I reviewed the triage vital signs and the nursing notes.                              Assessment and plan UTI 82 year old female presents to the emergency department with dysuria and increased urinary frequency for the past  2 days.  Vital signs are reassuring at triage.  On exam, patient was alert and nontoxic-appearing.  Urinalysis concerning for UTI.  Patient was given injection of Rocephin and was discharged with [facility].  Nursing staff administered lidocaine with Rocephin and after shot was administered, patient told nursing staff that she was allergic to lidocaine.  Patient was observed in the emergency department with no adverse effects.  Return precautions were given to return with new or worsening symptoms.  All patient questions were answered.      FINAL CLINICAL IMPRESSION(S) / ED DIAGNOSES   Final diagnoses:  Acute cystitis without hematuria     Rx / DC Orders   ED Discharge Orders          Ordered    cefdinir (OMNICEF) 300 MG capsule  2 times daily        03/15/23 2106             Note:  This document was prepared using Dragon voice recognition software and may include unintentional dictation errors.   Pia Mau Bentley, Cordelia Poche 03/15/23  2330    Dionne Bucy, MD 03/16/23 1116

## 2023-03-15 NOTE — ED Notes (Signed)
Pt unable to be found by staff at this time. PA notified.

## 2023-03-15 NOTE — Discharge Instructions (Signed)
Take Omnicef twice daily for seven days.  °

## 2023-03-15 NOTE — ED Notes (Signed)
Pt states after injection that she is allergic to lidocaine. She states she gets tremors and dizziness. Pt states that she is normally observed for 10 minutes after receiving lidocaine in the past. Pt denying symptoms at this time. PA notified and allergies updated to include lidocaine.

## 2023-03-15 NOTE — Telephone Encounter (Signed)
     Summary: Question UTI   Per son Lars Mage) he states his mother is experiencing shivers, pain when urinating, dark yellow urine and frequent urination.  S/P foot surgery/ankle fusion,    (patient is with son who is the caller)         Chief Complaint: son on DPR reports UTI sx requesting antibiotic today and a call back Symptoms: urinary frequency, dark yellow urine. Pain with  urination , shivering unknown temp. S/p ft/ ankle surgery  Frequency: last night  Pertinent Negatives: Patient denies na  Disposition: [] ED /[] Urgent Care (no appt availability in office) / [] Appointment(In office/virtual)/ []  [facility]/ [] Home Care/ [x] Refused Recommended Disposition /[] [facility]/ []  Follow-up with PCP Additional Notes:   No available appt today. Recommended UC today . Patient son refused and requesting call back from PCP because he "knows what it is". Requesting antibiotics, advise patient would need to submit urine sample. Son would like a call back.    Reason for Disposition  Age > 50 years  Answer Assessment - Initial Assessment Questions 1. SEVERITY: "How bad is the pain?"  (e.g., Scale 1-10; mild, moderate, or severe)   - MILD (1-3): complains slightly about urination hurting   - MODERATE (4-7): interferes with normal activities     - SEVERE (8-10): excruciating, unwilling or unable to urinate because of the pain      Pain with urination 2. FREQUENCY: "How many times have you had painful urination today?"      Na  3. PATTERN: "Is pain present every time you urinate or just sometimes?"      Na  4. ONSET: "When did the painful urination start?"      Last night  5. FEVER: "Do you have a fever?" If Yes, ask: "What is your temperature, how was it measured, and when did it start?"     Na , has been shivering  6. PAST UTI: "Have you had a urine infection before?" If Yes, ask: "When was the last time?" and "What happened that time?"      Na  7. CAUSE: "What do  you think is causing the painful urination?"  (e.g., UTI, scratch, Herpes sore)     UTI 8. OTHER SYMPTOMS: "Do you have any other symptoms?" (e.g., blood in urine, flank pain, genital sores, urgency, vaginal discharge)     Shivering, dark yellow urine frequent urination. S/p ft/ ankle surgery 9. PREGNANCY: "Is there any chance you are pregnant?" "When was your last menstrual period?"     na  Protocols used: Urination Pain - Female-A-AH

## 2023-03-15 NOTE — Telephone Encounter (Signed)
Spoke with Vanessa [place] and, subsequently, Stark Klein.  Explained to them why it is extremely important that they actually go in to an urgent care or the ER to have her very probable urinary tract infection appropriately evaluated and treated.  Explained that, for the best possible treatment of this infection, I cannot give just oral medication as the most appropriate treatment (based on it sounding like a complicated UTI) involves an initial IV or IM medication, followed by oral medication.  Also discussed that, due to her recent surgery (and possible catheterization during the surgery), there also exist the potential that she was exposed to a more resistant infection, which makes oral antibiotics even less likely to be effective.  Explained to her that this makes it even more important for her to have her urine checked and a urine culture sent, so that, if the antibiotic selected does not improve her symptoms, we can at least see what bacteria may be growing and its sensitivities/resistances without inadvertently altering the results. Patient and her son both expressed understanding.  Patient stated she would go in the morning; I strongly urged her to go today and explained the potential risk of the infection growing severely worse, leading to confusion, potentially falling and having significant morbidity/mortality associated with the current infection.  Patient expressed understanding.

## 2023-03-15 NOTE — ED Triage Notes (Signed)
Pt to ed from home via POV for UTI symptoms. Pt states "I have burning when I pee and increased urge to go". Pt is caux4, in no acute distress and in a wheel chair in triage.

## 2023-03-18 ENCOUNTER — Encounter: Payer: Self-pay | Admitting: Gastroenterology

## 2023-03-18 ENCOUNTER — Ambulatory Visit: Payer: Medicare Other | Admitting: Gastroenterology

## 2023-03-18 VITALS — BP 107/68 | HR 71 | Temp 97.8°F | Ht 62.0 in

## 2023-03-18 DIAGNOSIS — R140 Abdominal distension (gaseous): Secondary | ICD-10-CM

## 2023-03-18 NOTE — Patient Instructions (Signed)
Low-FODMAP Eating Plan  FODMAP stands for fermentable oligosaccharides, disaccharides, monosaccharides, and polyols. These are sugars that are hard for some people to digest. A low-FODMAP eating plan may help some people who have irritable bowel syndrome (IBS) and certain other bowel (intestinal) diseases to manage their symptoms. This meal plan can be complicated to follow. Work with a diet and nutrition specialist (dietitian) to make a low-FODMAP eating plan that is right for you. A dietitian can help make sure that you get enough nutrition from this diet. What are tips for following this plan? Reading food labels Check labels for hidden FODMAPs such as: High-fructose syrup. Honey. Agave. Natural fruit flavors. Onion or garlic powder. Choose low-FODMAP foods that contain 3-4 grams of fiber per serving. Check food labels for serving sizes. Eat only one serving at a time to make sure FODMAP levels stay low. Shopping Shop with a list of foods that are recommended on this diet and make a meal plan. Meal planning Follow a low-FODMAP eating plan for up to 6 weeks, or as told by your health care provider or dietitian. To follow the eating plan: Eliminate high-FODMAP foods from your diet completely. Choose only low-FODMAP foods to eat. You will do this for 2-6 weeks. Gradually reintroduce high-FODMAP foods into your diet one at a time. Most people should wait a few days before introducing the next new high-FODMAP food into their meal plan. Your dietitian can recommend how quickly you may reintroduce foods. Keep a daily record of what and how much you eat and drink. Make note of any symptoms that you have after eating. Review your daily record with a dietitian regularly to identify which foods you can eat and which foods you should avoid. General tips Drink enough fluid each day to keep your urine pale yellow. Avoid processed foods. These often have added sugar and may be high in FODMAPs. Avoid  most dairy products, whole grains, and sweeteners. Work with a dietitian to make sure you get enough fiber in your diet. Avoid high FODMAP foods at meals to manage symptoms. Recommended foods Fruits Bananas, oranges, tangerines, lemons, limes, blueberries, raspberries, strawberries, grapes, cantaloupe, honeydew melon, kiwi, papaya, passion fruit, and pineapple. Limited amounts of dried cranberries, banana chips, and shredded coconut. Vegetables Eggplant, zucchini, cucumber, peppers, green beans, bean sprouts, lettuce, arugula, kale, Swiss chard, spinach, collard greens, bok choy, summer squash, potato, and tomato. Limited amounts of corn, carrot, and sweet potato. Green parts of scallions. Grains Gluten-free grains, such as rice, oats, buckwheat, quinoa, corn, polenta, and millet. Gluten-free pasta, bread, or cereal. Rice noodles. Corn tortillas. Meats and other proteins Unseasoned beef, pork, poultry, or fish. Eggs. Bacon. Tofu (firm) and tempeh. Limited amounts of nuts and seeds, such as almonds, walnuts, brazil nuts, pecans, peanuts, nut butters, pumpkin seeds, chia seeds, and sunflower seeds. Dairy Lactose-free milk, yogurt, and kefir. Lactose-free cottage cheese and ice cream. Non-dairy milks, such as almond, coconut, hemp, and rice milk. Non-dairy yogurt. Limited amounts of goat cheese, brie, mozzarella, parmesan, swiss, and other hard cheeses. Fats and oils Butter-free spreads. Vegetable oils, such as olive, canola, and sunflower oil. Seasoning and other foods Artificial sweeteners with names that do not end in "ol," such as aspartame, saccharine, and stevia. Maple syrup, white table sugar, raw sugar, brown sugar, and molasses. Mayonnaise, soy sauce, and tamari. Fresh basil, coriander, parsley, rosemary, and thyme. Beverages Water and mineral water. Sugar-sweetened soft drinks. Small amounts of orange juice or cranberry juice. Black and green tea. Most dry wines.   Coffee. The items listed  above may not be a complete list of foods and beverages you can eat. Contact a dietitian for more information. Foods to avoid Fruits Fresh, dried, and juiced forms of apple, pear, watermelon, peach, plum, cherries, apricots, blackberries, boysenberries, figs, nectarines, and mango. Avocado. Vegetables Chicory root, artichoke, asparagus, cabbage, snow peas, Brussels sprouts, broccoli, sugar snap peas, mushrooms, celery, and cauliflower. Onions, garlic, leeks, and the white part of scallions. Grains Wheat, including kamut, durum, and semolina. Barley and bulgur. Couscous. Wheat-based cereals. Wheat noodles, bread, crackers, and pastries. Meats and other proteins Fried or fatty meat. Sausage. Cashews and pistachios. Soybeans, baked beans, black beans, chickpeas, kidney beans, fava beans, navy beans, lentils, black-eyed peas, and split peas. Dairy Milk, yogurt, ice cream, and soft cheese. Cream and sour cream. Milk-based sauces. Custard. Buttermilk. Soy milk. Seasoning and other foods Any sugar-free gum or candy. Foods that contain artificial sweeteners such as sorbitol, mannitol, isomalt, or xylitol. Foods that contain honey, high-fructose corn syrup, or agave. Bouillon, vegetable stock, beef stock, and chicken stock. Garlic and onion powder. Condiments made with onion, such as hummus, chutney, pickles, relish, salad dressing, and salsa. Tomato paste. Beverages Chicory-based drinks. Coffee substitutes. Chamomile tea. Fennel tea. Sweet or fortified wines such as port or sherry. Diet soft drinks made with isomalt, mannitol, maltitol, sorbitol, or xylitol. Apple, pear, and mango juice. Juices with high-fructose corn syrup. The items listed above may not be a complete list of foods and beverages you should avoid. Contact a dietitian for more information. Summary FODMAP stands for fermentable oligosaccharides, disaccharides, monosaccharides, and polyols. These are sugars that are hard for some people to  digest. A low-FODMAP eating plan is a short-term diet that helps to ease symptoms of certain bowel diseases. The eating plan usually lasts up to 6 weeks. After that, high-FODMAP foods are reintroduced gradually and one at a time. This can help you find out which foods may be causing symptoms. A low-FODMAP eating plan can be complicated. It is best to work with a dietitian who has experience with this type of plan. This information is not intended to replace advice given to you by your health care provider. Make sure you discuss any questions you have with your health care provider. Document Revised: 01/07/2020 Document Reviewed: 01/07/2020 Elsevier Patient Education  2024 Elsevier Inc.  
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
## 2023-03-20 ENCOUNTER — Other Ambulatory Visit: Payer: Self-pay

## 2023-03-20 DIAGNOSIS — E559 Vitamin D deficiency, unspecified: Secondary | ICD-10-CM

## 2023-03-20 DIAGNOSIS — M810 Age-related osteoporosis without current pathological fracture: Secondary | ICD-10-CM

## 2023-03-21 ENCOUNTER — Telehealth: Payer: Self-pay | Admitting: *Deleted

## 2023-03-21 NOTE — Transitions of Care (Post Inpatient/ED Visit) (Signed)
03/21/2023  Name: Nichole [name] MRN: [id number] DOB: 18-Feb-1941  Today's TOC FU Call Status: Today's TOC FU Call Status:: Successful TOC FU Call Competed TOC FU Call Complete Date: 03/21/23  Transition Care Management Follow-up Telephone Call Date of Discharge: 03/16/23 Discharge Facility: [facility]) Type of Discharge: Emergency Department Reason for ED Visit: Other: (acute cystitis without hematuria) How have you been since you were released from the hospital?: Better Any questions or concerns?: No  Items Reviewed: Did you receive and understand the discharge instructions provided?: Yes Medications obtained,verified, and reconciled?: Yes (Medications Reviewed) Any new allergies since your discharge?: No Dietary orders reviewed?: No Do you have support at home?: Yes People in Home: alone Name of Support/Comfort Primary Source: Lars Mage  Medications Reviewed Today: Medications Reviewed Today     Reviewed by Luella Cook, RN (Case Manager) on 03/21/23 at 1128  Med List Status: <None>   Medication Order Taking? Sig Documenting Provider Last Dose Status Informant  albuterol (VENTOLIN HFA) 108 (90 Base) MCG/ACT inhaler [id number] Yes Inhale 2 puffs into the lungs every 6 (six) hours as needed for shortness of breath or wheezing. [provider] Taking Active   atorvastatin (LIPITOR) 40 MG tablet [id number] Yes Take 1 tablet (40 mg total) by mouth daily. Erasmo Downer, MD Taking Active   budesonide [facility]) 180 MCG/ACT inhaler [id number] Yes Inhale 2 puffs into the lungs daily. [provider] Taking Active   busPIRone (BUSPAR) 15 MG tablet [id number] Yes Take 30 mg by mouth 2 (two) times daily. 30 mg in the morning and 30 mg in the evening [provider] Taking Active   cefdinir (OMNICEF) 300 MG capsule [id number] Yes Take 1 capsule (300 mg total) by mouth 2 (two) times daily for 7 days. Pia Mau M,  PA-C Taking Active   Cholecalciferol 50 MCG (2000 UT) CAPS [id number] Yes Take by mouth as needed. [provider] Taking Active   clopidogrel (PLAVIX) 75 MG tablet [id number] Yes TAKE 1 TABLET BY MOUTH EVERY DAY Erasmo Downer, MD Taking Active   DULoxetine (CYMBALTA) 60 MG capsule [id number] Yes TAKE 1 CAPSULE BY MOUTH EVERY DAY Everlena Cooper, Adam R, DO Taking Active   gabapentin (NEURONTIN) 600 MG tablet [id number] Yes TAKE 2 TABLETS (1,200 MG TOTAL) BY MOUTH 2 (TWO) TIMES DAILY. Erasmo Downer, MD Taking Active   levothyroxine (SYNTHROID) 75 MCG tablet [id number] Yes Alternating with 88 mcg every other day Erasmo Downer, MD Taking Active   levothyroxine (SYNTHROID) 88 MCG tablet [id number] Yes Alternating with 75 mcg every other day Erasmo Downer, MD Taking Active   Lidocaine, Anorectal, 5 % CREA [id number] Yes Apply topically BID prn Erasmo Downer, MD Taking Active   loratadine (CLARITIN) 10 MG tablet [id number] Yes Take 10 mg by mouth daily. [provider] Taking Active   oxyCODONE (OXY IR/ROXICODONE) 5 MG immediate release tablet [id number] Yes Take 5 mg by mouth every 4 (four) hours as needed for severe pain. [provider] Taking Active   pantoprazole (PROTONIX) 40 MG tablet [id number] Yes TAKE 1 TABLET (40 MG TOTAL) BY MOUTH TWICE A DAY BEFORE MEALS Bacigalupo, Marzella Schlein, MD Taking Active   polyethylene glycol powder (GLYCOLAX/MIRALAX) powder [id number] Yes Take 255 g by mouth once. Kieth Brightly, MD Taking Active            Med Note Leida Lauth   Tue Jun 27, 2021  3:40 PM) As needed  temazepam (RESTORIL) 15  MG capsule [id number] Yes Take 15 mg by mouth at bedtime. [provider] Taking Active             Home Care and Equipment/Supplies: Were Home Health Services Ordered?: NA Any new equipment or medical supplies ordered?: NA  Functional Questionnaire: Do you need assistance with bathing/showering or dressing?:  No Do you need assistance with meal preparation?: No Do you have difficulty maintaining continence: No Do you need assistance with getting out of bed/getting out of a chair/moving?: No Do you have difficulty managing or taking your medications?: No  Follow up appointments reviewed: PCP Follow-up appointment confirmed?: NA Specialist Hospital Follow-up appointment confirmed?: Yes Date of Specialist follow-up appointment?: 03/18/23 Follow-Up Specialty Provider:: [id number] Dr Tobi Bastos, [id number] Labs, [id number] Endocrinolgy Do you need transportation to your follow-up appointment?: No Do you understand care options if your condition(s) worsen?: Yes-patient verbalized understanding  SDOH Interventions Today    Flowsheet Row Most Recent Value  SDOH Interventions   Food Insecurity Interventions Intervention Not Indicated  Housing Interventions Intervention Not Indicated  Transportation Interventions Intervention Not Indicated, Patient Resources (Friends/Family)      Interventions Today    Flowsheet Row Most Recent Value  General Interventions   General Interventions Discussed/Reviewed General Interventions Discussed, General Interventions Reviewed, Doctor Visits  Doctor Visits Discussed/Reviewed Doctor Visits Discussed, Doctor Visits Reviewed  Pharmacy Interventions   Pharmacy Dicussed/Reviewed Pharmacy Topics Discussed  [RN discussed taking all of atbx untl complete]        Gean Maidens BSN RN [facility] Care Management [phone]

## 2023-03-27 DIAGNOSIS — M19071 Primary osteoarthritis, right ankle and foot: Secondary | ICD-10-CM | POA: Diagnosis not present

## 2023-03-29 ENCOUNTER — Other Ambulatory Visit: Payer: Medicare Other

## 2023-03-31 ENCOUNTER — Other Ambulatory Visit: Payer: Self-pay | Admitting: Family Medicine

## 2023-04-01 ENCOUNTER — Ambulatory Visit: Payer: Medicare Other | Admitting: "Endocrinology

## 2023-04-19 ENCOUNTER — Telehealth: Payer: Self-pay | Admitting: *Deleted

## 2023-04-19 NOTE — Telephone Encounter (Signed)
Telephone encounter was:  Successful.  04/19/2023 Name: Nichole [name] MRN: [id number] DOB: 1941/03/24  Nichole [name] is a 82 y.o. year old female who is a primary care patient of Sherlyn Hay, DO . The community resource team was consulted for assistance with Transportation Needs  Patient called for transportation for monday appt scheduled and secured transportation for pt appt with ortho dr  Care guide performed the following interventions: Patient provided with information about care guide support team and interviewed to confirm resource needs.  Follow Up Plan:  No further follow up planned at this time. The patient has been provided with needed resources. Nichole Mao [facility] JAARS, Population Health [phone] [address] [place] , [place] [address] Email : Nichole [name] @[place] .Nichole [name] DOB: November 05, 1940 MRN: [id number]   RIDER WAIVER AND RELEASE OF LIABILITY  For purposes of improving physical access to our facilities, [facility] is pleased to partner with third parties to provide [facility] Health patients or other authorized individuals the option of convenient, on-demand ground transportation services (the [employer]") through use of the technology service that enables users to request on-demand ground transportation from independent third-party providers.  By opting to use and accept these [employer], I, the undersigned, hereby agree on behalf of myself, and on behalf of any minor child using the Science writer for whom I am the parent or legal guardian, as follows:  Science writer provided to me are provided by independent third-party transportation providers who are not [employer] or employees and who are unaffiliated with [employer]. [place] is neither a transportation carrier nor a common or public carrier. [place] has no control over the quality or safety of the transportation that  occurs as a result of the [employer]. [place] cannot guarantee that any third-party transportation provider will complete any arranged transportation service. [place] makes no representation, warranty, or guarantee regarding the reliability, timeliness, quality, safety, suitability, or availability of any of the Transport Services or that they will be error free. I fully understand that traveling by vehicle involves risks and dangers of serious bodily injury, including permanent disability, paralysis, and death. I agree, on behalf of myself and on behalf of any minor child using the Transport Services for whom I am the parent or legal guardian, that the entire risk arising out of my use of the [employer] remains solely with me, to the maximum extent permitted under applicable law. The [employer] are provided "as is" and "as available." [place] disclaims all representations and warranties, express, implied or statutory, not expressly set out in these terms, including the implied warranties of merchantability and fitness for a particular purpose. I hereby waive and release [place], its agents, employees, officers, directors, representatives, insurers, attorneys, assigns, successors, subsidiaries, and affiliates from any and all past, present, or future claims, demands, liabilities, actions, causes of action, or suits of any kind directly or indirectly arising from acceptance and use of the [employer]. I further waive and release [place] and its affiliates from all present and future liability and responsibility for any injury or death to persons or damages to property caused by or related to the use of the [employer]. I have read this Waiver and Release of Liability, and I understand the terms used in it and their legal significance. This Waiver is freely and voluntarily given with the understanding that my right (as well as  the right of any minor  child for whom I am the parent or legal guardian using the [employer]) to legal recourse against [place] in connection with the [employer] is knowingly surrendered in return for use of these services.   I attest that I read the consent document to Nichole [name], gave Ms. Baldwin [name] the opportunity to ask questions and answered the questions asked (if any). I affirm that Nichole [name] then provided consent for she's participation in this program.     Nichole [name]

## 2023-04-22 DIAGNOSIS — M19071 Primary osteoarthritis, right ankle and foot: Secondary | ICD-10-CM | POA: Diagnosis not present

## 2023-04-25 DIAGNOSIS — M19071 Primary osteoarthritis, right ankle and foot: Secondary | ICD-10-CM | POA: Diagnosis not present

## 2023-04-25 DIAGNOSIS — M25671 Stiffness of right ankle, not elsewhere classified: Secondary | ICD-10-CM | POA: Diagnosis not present

## 2023-04-25 DIAGNOSIS — R262 Difficulty in walking, not elsewhere classified: Secondary | ICD-10-CM | POA: Diagnosis not present

## 2023-04-26 ENCOUNTER — Telehealth: Payer: Self-pay | Admitting: *Deleted

## 2023-04-26 NOTE — Telephone Encounter (Signed)
   Nichole [name] DOB: [birth year] MRN: [id number]   RIDER WAIVER AND RELEASE OF LIABILITY  For purposes of improving physical access to our facilities, [facility] is pleased to partner with third parties to provide [facility] patients or other authorized individuals the option of convenient, on-demand ground transportation services (the [employer]") through use of the technology service that enables users to request on-demand ground transportation from independent third-party providers.  By opting to use and accept these [employer], I, the undersigned, hereby agree on behalf of myself, and on behalf of any minor child using the Science writer for whom I am the parent or legal guardian, as follows:  Science writer provided to me are provided by independent third-party transportation providers who are not [employer] or employees and who are unaffiliated with [employer]. [place] is neither a transportation carrier nor a common or public carrier. [place] has no control over the quality or safety of the transportation that occurs as a result of the [employer]. [place] cannot guarantee that any third-party transportation provider will complete any arranged transportation service. [place] makes no representation, warranty, or guarantee regarding the reliability, timeliness, quality, safety, suitability, or availability of any of the Transport Services or that they will be error free. I fully understand that traveling by vehicle involves risks and dangers of serious bodily injury, including permanent disability, paralysis, and death. I agree, on behalf of myself and on behalf of any minor child using the Transport Services for whom I am the parent or legal guardian, that the entire risk arising out of my use of the [employer] remains solely with me, to the maximum extent permitted under applicable law. The [employer] are provided  "as is" and "as available." [place] disclaims all representations and warranties, express, implied or statutory, not expressly set out in these terms, including the implied warranties of merchantability and fitness for a particular purpose. I hereby waive and release [place], its agents, employees, officers, directors, representatives, insurers, attorneys, assigns, successors, subsidiaries, and affiliates from any and all past, present, or future claims, demands, liabilities, actions, causes of action, or suits of any kind directly or indirectly arising from acceptance and use of the [employer]. I further waive and release [place] and its affiliates from all present and future liability and responsibility for any injury or death to persons or damages to property caused by or related to the use of the [employer]. I have read this Waiver and Release of Liability, and I understand the terms used in it and their legal significance. This Waiver is freely and voluntarily given with the understanding that my right (as well as the right of any minor child for whom I am the parent or legal guardian using the [employer]) to legal recourse against [place] in connection with the [employer] is knowingly surrendered in return for use of these services.   I attest that I read the consent document to Nichole [name], gave Nichole [name] the opportunity to ask questions and answered the questions asked (if any). I affirm that Nichole [name] then provided consent for she's participation in this program.     Nichole [name]

## 2023-04-30 DIAGNOSIS — M25671 Stiffness of right ankle, not elsewhere classified: Secondary | ICD-10-CM | POA: Diagnosis not present

## 2023-04-30 DIAGNOSIS — M19071 Primary osteoarthritis, right ankle and foot: Secondary | ICD-10-CM | POA: Diagnosis not present

## 2023-04-30 DIAGNOSIS — R262 Difficulty in walking, not elsewhere classified: Secondary | ICD-10-CM | POA: Diagnosis not present

## 2023-05-03 ENCOUNTER — Telehealth: Payer: Self-pay | Admitting: *Deleted

## 2023-05-07 ENCOUNTER — Telehealth: Payer: Self-pay | Admitting: *Deleted

## 2023-05-07 NOTE — Telephone Encounter (Signed)
Telephone encounter was:  Successful.  05/07/2023 Name: Nichole [name] MRN: [id number] DOB: [birth year]  Nichole [name] is a 82 y.o. year old female who is a primary care patient of Sherlyn Hay, DO . The community resource team was consulted for assistance with Transportation Needs   Care guide performed the following interventions: Patient provided with information about care guide support team and interviewed to confirm resource needs. called to schedule ride for 05/08/2023 and 9/6 picked up by trusted rides and was notified    Follow Up Plan:  No further follow up planned at this time. The patient has been provided with needed resources. [name] [facility] Health  Population Health Careguide  Direct Dial: [phone] Website: [email or web address]   Nichole [name] DOB: 1940-09-22 MRN: [id number]   RIDER WAIVER AND RELEASE OF LIABILITY  For purposes of improving physical access to our facilities, [facility] is pleased to partner with third parties to provide [facility] Health patients or other authorized individuals the option of convenient, on-demand ground transportation services (the [employer]") through use of the technology service that enables users to request on-demand ground transportation from independent third-party providers.  By opting to use and accept these [employer], I, the undersigned, hereby agree on behalf of myself, and on behalf of any minor child using the Science writer for whom I am the parent or legal guardian, as follows:  Science writer provided to me are provided by independent third-party transportation providers who are not [employer] or employees and who are unaffiliated with [employer]. [place] is neither a transportation carrier nor a common or public carrier. [place] has no control over the quality or safety of the transportation that occurs as a result of the [employer]. [place] cannot  guarantee that any third-party transportation provider will complete any arranged transportation service. [place] makes no representation, warranty, or guarantee regarding the reliability, timeliness, quality, safety, suitability, or availability of any of the Transport Services or that they will be error free. I fully understand that traveling by vehicle involves risks and dangers of serious bodily injury, including permanent disability, paralysis, and death. I agree, on behalf of myself and on behalf of any minor child using the Transport Services for whom I am the parent or legal guardian, that the entire risk arising out of my use of the [employer] remains solely with me, to the maximum extent permitted under applicable law. The [employer] are provided "as is" and "as available." [place] disclaims all representations and warranties, express, implied or statutory, not expressly set out in these terms, including the implied warranties of merchantability and fitness for a particular purpose. I hereby waive and release [place], its agents, employees, officers, directors, representatives, insurers, attorneys, assigns, successors, subsidiaries, and affiliates from any and all past, present, or future claims, demands, liabilities, actions, causes of action, or suits of any kind directly or indirectly arising from acceptance and use of the [employer]. I further waive and release  and its affiliates from all present and future liability and responsibility for any injury or death to persons or damages to property caused by or related to the use of the [employer]. I have read this Waiver and Release of Liability, and I understand the terms used in it and their legal significance. This Waiver is freely and voluntarily given with the understanding that my right (as well as the right of any minor child for whom  I am the parent or legal guardian using the [employer]) to legal recourse against [place] in connection with the [employer] is knowingly surrendered in return for use of these services.   I attest that I read the consent document to [name], gave Ms. Baldwin [name] the opportunity to ask questions and answered the questions asked (if any). I affirm that [name] then provided consent for she's participation in this program.     [name]                                 Nichole [name] DOB: [birth year] MRN: [id number]   RIDER WAIVER AND RELEASE OF LIABILITY  For purposes of improving physical access to our facilities, [facility] is pleased to partner with third parties to provide [facility] Health patients or other authorized individuals the option of convenient, on-demand ground transportation services (the [employer]") through use of the technology service that enables users to request on-demand ground transportation from independent third-party providers.  By opting to use and accept these [employer], I, the undersigned, hereby agree on behalf of myself, and on behalf of any minor child using the Science writer for whom I am the parent or legal guardian, as follows:  Science writer provided to me are provided by independent third-party transportation providers who are not [employer] or employees and who are unaffiliated with [employer]. [place] is neither a transportation carrier nor a common or public carrier. [place] has no control over the quality or safety of the transportation that occurs as a result of the [employer]. [place] cannot guarantee that any third-party transportation provider will complete any arranged transportation service. [place] makes no representation, warranty, or guarantee regarding the reliability, timeliness, quality, safety, suitability, or availability of any of the Transport Services or that they will be error free. I  fully understand that traveling by vehicle involves risks and dangers of serious bodily injury, including permanent disability, paralysis, and death. I agree, on behalf of myself and on behalf of any minor child using the Transport Services for whom I am the parent or legal guardian, that the entire risk arising out of my use of the [employer] remains solely with me, to the maximum extent permitted under applicable law. The [employer] are provided "as is" and "as available." [place] disclaims all representations and warranties, express, implied or statutory, not expressly set out in these terms, including the implied warranties of merchantability and fitness for a particular purpose. I hereby waive and release [place], its agents, employees, officers, directors, representatives, insurers, attorneys, assigns, successors, subsidiaries, and affiliates from any and all past, present, or future claims, demands, liabilities, actions, causes of action, or suits of any kind directly or indirectly arising from acceptance and use of the [employer]. I further waive and release  and its affiliates from all present and future liability and responsibility for any injury or death to persons or damages to property caused by or related to the use of the [employer]. I have read this Waiver and Release of Liability, and I understand the terms used in it and their legal significance. This Waiver is freely and voluntarily given with the understanding that my right (as well as the right of any minor child for whom I am the parent or legal guardian using the [employer]) to legal  recourse against [place] in connection with the Transport Services is knowingly surrendered in return for use of these services.   I attest that I read the consent document to [name], gave Ms. Baldwin [name] the opportunity to ask questions and answered the questions asked (if any). I affirm  that [name] then provided consent for she's participation in this program.     [name]

## 2023-05-10 ENCOUNTER — Other Ambulatory Visit: Payer: Self-pay | Admitting: Family Medicine

## 2023-05-15 DIAGNOSIS — R262 Difficulty in walking, not elsewhere classified: Secondary | ICD-10-CM | POA: Diagnosis not present

## 2023-05-15 DIAGNOSIS — M19071 Primary osteoarthritis, right ankle and foot: Secondary | ICD-10-CM | POA: Diagnosis not present

## 2023-05-15 DIAGNOSIS — M25671 Stiffness of right ankle, not elsewhere classified: Secondary | ICD-10-CM | POA: Diagnosis not present

## 2023-05-20 DIAGNOSIS — M19071 Primary osteoarthritis, right ankle and foot: Secondary | ICD-10-CM | POA: Diagnosis not present

## 2023-05-20 DIAGNOSIS — R262 Difficulty in walking, not elsewhere classified: Secondary | ICD-10-CM | POA: Diagnosis not present

## 2023-05-20 DIAGNOSIS — M25671 Stiffness of right ankle, not elsewhere classified: Secondary | ICD-10-CM | POA: Diagnosis not present

## 2023-05-20 DIAGNOSIS — M25571 Pain in right ankle and joints of right foot: Secondary | ICD-10-CM | POA: Diagnosis not present

## 2023-05-28 ENCOUNTER — Ambulatory Visit (INDEPENDENT_AMBULATORY_CARE_PROVIDER_SITE_OTHER): Payer: Medicare Other | Admitting: Family Medicine

## 2023-05-28 ENCOUNTER — Encounter: Payer: Self-pay | Admitting: Family Medicine

## 2023-05-28 VITALS — BP 107/75 | HR 72 | Temp 97.8°F | Ht 62.5 in | Wt 154.1 lb

## 2023-05-28 DIAGNOSIS — F411 Generalized anxiety disorder: Secondary | ICD-10-CM

## 2023-05-28 DIAGNOSIS — K760 Fatty (change of) liver, not elsewhere classified: Secondary | ICD-10-CM | POA: Diagnosis not present

## 2023-05-28 DIAGNOSIS — J4530 Mild persistent asthma, uncomplicated: Secondary | ICD-10-CM

## 2023-05-28 DIAGNOSIS — J3089 Other allergic rhinitis: Secondary | ICD-10-CM | POA: Diagnosis not present

## 2023-05-28 DIAGNOSIS — F410 Panic disorder [episodic paroxysmal anxiety] without agoraphobia: Secondary | ICD-10-CM | POA: Diagnosis not present

## 2023-05-28 DIAGNOSIS — J302 Other seasonal allergic rhinitis: Secondary | ICD-10-CM | POA: Diagnosis not present

## 2023-06-02 DIAGNOSIS — J3089 Other allergic rhinitis: Secondary | ICD-10-CM | POA: Insufficient documentation

## 2023-06-02 DIAGNOSIS — J302 Other seasonal allergic rhinitis: Secondary | ICD-10-CM | POA: Insufficient documentation

## 2023-06-02 NOTE — Assessment & Plan Note (Addendum)
Discussed that patient's cough may stem from her asthma not being fully controlled.  Discussed treatment options and she would prefer to transition her allergy medication to see if that helps to alleviate her cough, rather than starting a daily asthma inhaler. Patient notes that she has an inhaler but has not used any inhalers in a very long time (months to years) Continue to monitor.

## 2023-06-02 NOTE — Assessment & Plan Note (Addendum)
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  Discussed with patient that this can be addressed with dietary changes and does not require routine checks.  Discussed that this is not liver scarring and not necessarily apt to progress to such, particularly when following a healthy diet and minimizing or eliminating alcohol use.  Goal: patient will be working to lose 10 pounds by her next visit.

## 2023-06-02 NOTE — Assessment & Plan Note (Addendum)
Managed with duloxetine and very infrequent doses of lorazepam.

## 2023-06-02 NOTE — Assessment & Plan Note (Signed)
Patient will be transitioning to daily OTC fexofenadine in place of daily Claritin.

## 2023-06-03 DIAGNOSIS — F331 Major depressive disorder, recurrent, moderate: Secondary | ICD-10-CM | POA: Diagnosis not present

## 2023-06-11 ENCOUNTER — Other Ambulatory Visit (INDEPENDENT_AMBULATORY_CARE_PROVIDER_SITE_OTHER): Payer: Medicare Other

## 2023-06-11 DIAGNOSIS — E559 Vitamin D deficiency, unspecified: Secondary | ICD-10-CM

## 2023-06-11 DIAGNOSIS — M810 Age-related osteoporosis without current pathological fracture: Secondary | ICD-10-CM

## 2023-06-11 LAB — RENAL FUNCTION PANEL
Albumin: 4 g/dL (ref 3.5–5.2)
BUN: 8 mg/dL (ref 6–23)
CO2: 28 meq/L (ref 19–32)
Calcium: 9.2 mg/dL (ref 8.4–10.5)
Chloride: 104 meq/L (ref 96–112)
Creatinine, Ser: 0.82 mg/dL (ref 0.40–1.20)
GFR: 67.81 mL/min (ref >60.00)
Glucose, Bld: 106 mg/dL — ABNORMAL HIGH (ref 70–99)
Phosphorus: 4.7 mg/dL — ABNORMAL HIGH (ref 2.3–4.6)
Potassium: 4.3 meq/L (ref 3.5–5.1)
Sodium: 140 meq/L (ref 135–145)

## 2023-06-11 LAB — VITAMIN D 25 HYDROXY (VIT D DEFICIENCY, FRACTURES): VITD: 27.06 ng/mL — ABNORMAL LOW (ref 30.00–100.00)

## 2023-06-11 LAB — MAGNESIUM: Magnesium: 2.1 mg/dL (ref 1.5–2.5)

## 2023-06-12 ENCOUNTER — Telehealth: Payer: Self-pay | Admitting: *Deleted

## 2023-06-12 NOTE — Telephone Encounter (Signed)
   Telephone encounter was:  Successful.  06/12/2023 Name: Nichole [name] MRN: [id number] DOB: 1941-05-07  Nichole [name] is a 82 y.o. year old female who is a primary care patient of Sherlyn Hay, DO . The community resource team was consulted for assistance with Transportation Needs  Patient called to schedule transportation for friday advised we would not be able to schedule presently as this appt is with [facility] , patient would need to find transportation through them , did provide community transportation in [employer] guide performed the following interventions: Follow up call placed to the patient to discuss status of referral.  Follow Up Plan  none planned   Kobie Whidby [facility] Health  Population Health Careguide  Direct Dial: [phone] Website: [email or web address]

## 2023-06-14 LAB — VITAMIN D 1,25 DIHYDROXY
Vitamin D 1, 25 (OH)2 Total: 37 pg/mL (ref 18–72)
Vitamin D2 1, 25 (OH)2: <8 pg/mL
Vitamin D3 1, 25 (OH)2: 37 pg/mL

## 2023-06-14 LAB — PTH, INTACT AND CALCIUM
Calcium: 9.3 mg/dL (ref 8.6–10.4)
PTH: 40 pg/mL (ref 16–77)

## 2023-06-15 ENCOUNTER — Other Ambulatory Visit: Payer: Self-pay | Admitting: Family Medicine

## 2023-06-15 DIAGNOSIS — E039 Hypothyroidism, unspecified: Secondary | ICD-10-CM

## 2023-06-17 NOTE — Telephone Encounter (Signed)
Requested Prescriptions  Pending Prescriptions Disp Refills   levothyroxine (SYNTHROID) 75 MCG tablet [Pharmacy Med Name: LEVOTHYROXINE 75 MCG TABLET] 90 tablet 0    Sig: TAKE ONE TABLET ON AN EMPTY STOMACH TWO DAYS A WEEK.     Endocrinology:  Hypothyroid Agents Passed - 06/15/2023  1:04 AM      Passed - TSH in normal range and within 360 days    TSH  Date Value Ref Range Status  03/08/2023 1.130 0.450 - 4.500 uIU/mL Final         Passed - Valid encounter within last 12 months    Recent Outpatient Visits           2 weeks ago Mild persistent asthma without complication   [place] [facility] [place], Monico Blitz, DO   5 months ago Encounter for subsequent annual wellness visit (AWV) in Medicare patient   [facility] [place], Marzella Schlein, MD   6 months ago Rectal bleeding   [place] [facility] [place] Alfredia Ferguson, PA-C   1 year ago Cystitis   [place] [facility] [place], Marzella Schlein, MD   1 year ago Encounter for annual wellness visit (AWV) in Medicare patient   [place] [facility] [place], Marzella Schlein, MD       Future Appointments             Tomorrow [place] , MD [facility] Endocrinology   In 2 months [place], Arlys John, MD [facility] [place]   In 2 months [place], Monico Blitz, DO [place] [facility], [facility]

## 2023-06-18 ENCOUNTER — Encounter: Payer: Self-pay | Admitting: "Endocrinology

## 2023-06-18 ENCOUNTER — Ambulatory Visit (INDEPENDENT_AMBULATORY_CARE_PROVIDER_SITE_OTHER): Payer: Medicare Other | Admitting: "Endocrinology

## 2023-06-18 VITALS — BP 130/50 | HR 80 | Ht 62.5 in | Wt 158.2 lb

## 2023-06-18 DIAGNOSIS — M810 Age-related osteoporosis without current pathological fracture: Secondary | ICD-10-CM

## 2023-06-18 DIAGNOSIS — E559 Vitamin D deficiency, unspecified: Secondary | ICD-10-CM | POA: Diagnosis not present

## 2023-06-18 MED ORDER — VITAMIN D (ERGOCALCIFEROL) 1.25 MG (50000 UNIT) PO CAPS: 50000.0000 [IU] | ORAL_CAPSULE | ORAL | 0 refills | Status: DC
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
## 2023-06-20 ENCOUNTER — Telehealth: Payer: Self-pay | Admitting: Neurology

## 2023-06-20 NOTE — Telephone Encounter (Signed)
Per patient she heard that Cymbalta can cause damage to kidneys or cause more damage to already damage kidneys. So patient wanted to know if Dr.Jaffe would give hr advised on how to taper down.

## 2023-06-20 NOTE — Telephone Encounter (Signed)
Patient called and was wanting to speak with a nurse about medication

## 2023-06-21 NOTE — Telephone Encounter (Signed)
Patient advised of Dr. Everlena Cooper note, Cymbalta does not typically affect the kidneys.  It may affect the liver.  She has been on Cymbalta for years.  While one of her liver enzymes has been mildly elevated, it has remained stable and not an indication of liver failure, so I don't think we need to stop it.  If she prefers to stop it, then we can send in a prescription for one 30 days supply of Cymbalta 30mg  daily (no refills) and then stop.     Patient states she will stay on it for now. And do the 30 mg daily.

## 2023-06-26 DIAGNOSIS — M19071 Primary osteoarthritis, right ankle and foot: Secondary | ICD-10-CM | POA: Diagnosis not present

## 2023-06-26 DIAGNOSIS — M25671 Stiffness of right ankle, not elsewhere classified: Secondary | ICD-10-CM | POA: Diagnosis not present

## 2023-06-26 DIAGNOSIS — R262 Difficulty in walking, not elsewhere classified: Secondary | ICD-10-CM | POA: Diagnosis not present

## 2023-07-03 ENCOUNTER — Other Ambulatory Visit: Payer: Self-pay | Admitting: Family Medicine

## 2023-07-03 DIAGNOSIS — M19071 Primary osteoarthritis, right ankle and foot: Secondary | ICD-10-CM | POA: Diagnosis not present

## 2023-07-03 DIAGNOSIS — M1712 Unilateral primary osteoarthritis, left knee: Secondary | ICD-10-CM | POA: Diagnosis not present

## 2023-07-03 NOTE — Telephone Encounter (Signed)
Requested Prescriptions  Pending Prescriptions Disp Refills   levothyroxine (SYNTHROID) 88 MCG tablet [Pharmacy Med Name: LEVOTHYROXINE 88 MCG TABLET] 60 tablet 1    Sig: TAKE ONE TABLET ON AN EMPTY STOMACH FIVE DAYS A WEEK.     Endocrinology:  Hypothyroid Agents Passed - 07/03/2023  2:27 AM      Passed - TSH in normal range and within 360 days    TSH  Date Value Ref Range Status  03/08/2023 1.130 0.450 - 4.500 uIU/mL Final         Passed - Valid encounter within last 12 months    Recent Outpatient Visits           1 month ago Mild persistent asthma without complication   [place] [facility] [place], Monico Blitz, DO   5 months ago Encounter for subsequent annual wellness visit (AWV) in Medicare patient   [facility] [place], Marzella Schlein, MD   7 months ago Rectal bleeding   [place] [facility] Alfredia Ferguson, PA-C   1 year ago Cystitis   [place] [facility] [place], Marzella Schlein, MD   1 year ago Encounter for annual wellness visit (AWV) in Medicare patient   [facility] [place], Marzella Schlein, MD       Future Appointments             In 1 month [place], Arlys John, MD [facility] [place]   In 1 month [place], Monico Blitz, DO [facility]

## 2023-07-10 DIAGNOSIS — H2513 Age-related nuclear cataract, bilateral: Secondary | ICD-10-CM | POA: Diagnosis not present

## 2023-07-10 DIAGNOSIS — H532 Diplopia: Secondary | ICD-10-CM | POA: Diagnosis not present

## 2023-07-10 DIAGNOSIS — L910 Hypertrophic scar: Secondary | ICD-10-CM | POA: Diagnosis not present

## 2023-07-11 DIAGNOSIS — R262 Difficulty in walking, not elsewhere classified: Secondary | ICD-10-CM | POA: Diagnosis not present

## 2023-07-11 DIAGNOSIS — M25671 Stiffness of right ankle, not elsewhere classified: Secondary | ICD-10-CM | POA: Diagnosis not present

## 2023-07-11 DIAGNOSIS — M19071 Primary osteoarthritis, right ankle and foot: Secondary | ICD-10-CM | POA: Diagnosis not present

## 2023-07-16 DIAGNOSIS — R262 Difficulty in walking, not elsewhere classified: Secondary | ICD-10-CM | POA: Diagnosis not present

## 2023-07-16 DIAGNOSIS — M19071 Primary osteoarthritis, right ankle and foot: Secondary | ICD-10-CM | POA: Diagnosis not present

## 2023-07-16 DIAGNOSIS — M25671 Stiffness of right ankle, not elsewhere classified: Secondary | ICD-10-CM | POA: Diagnosis not present

## 2023-07-22 ENCOUNTER — Ambulatory Visit: Payer: Medicare Other | Admitting: Family Medicine

## 2023-07-23 DIAGNOSIS — M19071 Primary osteoarthritis, right ankle and foot: Secondary | ICD-10-CM | POA: Diagnosis not present

## 2023-07-23 DIAGNOSIS — R262 Difficulty in walking, not elsewhere classified: Secondary | ICD-10-CM | POA: Diagnosis not present

## 2023-07-23 DIAGNOSIS — M25671 Stiffness of right ankle, not elsewhere classified: Secondary | ICD-10-CM | POA: Diagnosis not present

## 2023-07-25 DIAGNOSIS — M25671 Stiffness of right ankle, not elsewhere classified: Secondary | ICD-10-CM | POA: Diagnosis not present

## 2023-07-25 DIAGNOSIS — M19071 Primary osteoarthritis, right ankle and foot: Secondary | ICD-10-CM | POA: Diagnosis not present

## 2023-07-25 DIAGNOSIS — R262 Difficulty in walking, not elsewhere classified: Secondary | ICD-10-CM | POA: Diagnosis not present

## 2023-08-02 ENCOUNTER — Other Ambulatory Visit: Payer: Self-pay | Admitting: Family Medicine

## 2023-08-16 ENCOUNTER — Ambulatory Visit: Payer: Medicare Other | Attending: Cardiology | Admitting: Cardiology

## 2023-08-16 ENCOUNTER — Encounter: Payer: Self-pay | Admitting: Cardiology

## 2023-08-16 VITALS — BP 134/62 | HR 62 | Ht 62.5 in | Wt 164.8 lb

## 2023-08-16 DIAGNOSIS — I2510 Atherosclerotic heart disease of native coronary artery without angina pectoris: Secondary | ICD-10-CM | POA: Insufficient documentation

## 2023-08-16 DIAGNOSIS — E782 Mixed hyperlipidemia: Secondary | ICD-10-CM | POA: Diagnosis not present

## 2023-08-16 NOTE — Patient Instructions (Signed)
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
## 2023-08-26 ENCOUNTER — Ambulatory Visit: Payer: Medicare Other | Admitting: Family Medicine

## 2023-09-18 ENCOUNTER — Encounter: Payer: Self-pay | Admitting: "Endocrinology

## 2023-09-18 ENCOUNTER — Ambulatory Visit (INDEPENDENT_AMBULATORY_CARE_PROVIDER_SITE_OTHER): Payer: Medicare Other | Admitting: "Endocrinology

## 2023-09-18 VITALS — BP 120/60 | HR 73 | Ht 62.5 in | Wt 164.0 lb

## 2023-09-18 DIAGNOSIS — M810 Age-related osteoporosis without current pathological fracture: Secondary | ICD-10-CM

## 2023-09-18 DIAGNOSIS — E559 Vitamin D deficiency, unspecified: Secondary | ICD-10-CM

## 2023-09-18 NOTE — Patient Instructions (Signed)
 Recommend to use calcium  600 mg twice daily and vitamin D  50,000 units weekly
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
## 2023-09-19 LAB — RENAL FUNCTION PANEL
Albumin: 4.1 g/dL (ref 3.6–5.1)
BUN: 10 mg/dL (ref 7–25)
CO2: 28 mmol/L (ref 20–32)
Calcium: 9.2 mg/dL (ref 8.6–10.4)
Chloride: 103 mmol/L (ref 98–110)
Creat: 0.82 mg/dL (ref 0.60–0.95)
Glucose, Bld: 102 mg/dL — ABNORMAL HIGH (ref 65–99)
Phosphorus: 3.4 mg/dL (ref 2.1–4.3)
Potassium: 4.2 mmol/L (ref 3.5–5.3)
Sodium: 138 mmol/L (ref 135–146)

## 2023-09-19 LAB — VITAMIN D 25 HYDROXY (VIT D DEFICIENCY, FRACTURES): Vit D, 25-Hydroxy: 82 ng/mL (ref 30–100)

## 2023-09-23 ENCOUNTER — Ambulatory Visit: Payer: Medicare Other | Admitting: Family Medicine

## 2023-09-24 ENCOUNTER — Ambulatory Visit: Payer: Self-pay

## 2023-09-24 ENCOUNTER — Ambulatory Visit: Payer: Medicare Other | Admitting: Family Medicine

## 2023-09-24 NOTE — Telephone Encounter (Signed)
Chief Complaint: Headache Symptoms: Mild headache right side of head, sleeping more often, post nasal drip Frequency: Come and go  Pertinent Negatives: Patient denies fever, nausea, vomiting, blurred vision, eye pain  Disposition: [] ED /[] Urgent Care (no appt availability in office) / [x] Appointment(In office/virtual)/ []  [facility]/ [] Home Care/ [] Refused Recommended Disposition /[] [facility]/ []  Follow-up with PCP Additional Notes: Patient states she has a headache on the right side of her head that comes and goes with an onset about 2-3 weeks ago. Patient reports a mild headache today. Patient also states she is sleeping a lot but is unsure if it is because she just doesn't have much to do at her age. Patient also stated she has post nasal drip. Patient stated she can not take tylenol or ibuprofen due to medical history. Patient had an appointment scheduled today with PCP but canceled due to the weather. Care advice was given and patient was rescheduled for PCP first available appointment 10/14/23. Also advised patient to report symptoms to her Neurologist Dr. Everlena Cooper and to call this office back if symptoms get worse. Patient verbalized understanding.   Summary: Headache, lethargy   The patient called in to cancel her appt today as it is too cold but she states she has had a headache for a couple of months on one side of her head. She also states she sleeps quite frequently and that started a few months ago. Please assist patient further. She rescheduled her appt from today until February 10th the next available appt with her provider.     Reason for Disposition  [1] MILD-MODERATE headache AND [2] present > 72 hours  Answer Assessment - Initial Assessment Questions 1. LOCATION: "Where does it hurt?"      On the right side of the head  2. ONSET: "When did the headache start?" (Minutes, hours or days)      About 2-3 weeks  3. PATTERN: "Does the pain come and go, or has it  been constant since it started?"     Come and go 4. SEVERITY: "How bad is the pain?" and "What does it keep you from doing?"  (e.g., Scale 1-10; mild, moderate, or severe)   - MILD (1-3): doesn't interfere with normal activities    - MODERATE (4-7): interferes with normal activities or awakens from sleep    - SEVERE (8-10): excruciating pain, unable to do any normal activities        Mild  5. RECURRENT SYMPTOM: "Have you ever had headaches before?" If Yes, ask: "When was the last time?" and "What happened that time?"      Yes, I take Cymbalta  6. CAUSE: "What do you think is causing the headache?"     I don't know  7. MIGRAINE: "Have you been diagnosed with migraine headaches?" If Yes, ask: "Is this headache similar?"      No  8. HEAD INJURY: "Has there been any recent injury to the head?"      No  9. OTHER SYMPTOMS: "Do you have any other symptoms?" (fever, stiff neck, eye pain, sore throat, cold symptoms)     Sleeping a lot, fatigued, post nasal drip  Protocols used: Headache-A-AH

## 2023-09-30 DIAGNOSIS — F331 Major depressive disorder, recurrent, moderate: Secondary | ICD-10-CM | POA: Diagnosis not present

## 2023-10-02 ENCOUNTER — Telehealth: Payer: Self-pay

## 2023-10-02 NOTE — Telephone Encounter (Signed)
Pt called stating that she did not remember what Dr Tobi Bastos advised her to do at her visit.Marland KitchenMarland Kitchen I went over Dr Johnney Killian recommendations and offered to mail her the AVS as she did not recall seeing the low-FODMAP diet... I also reset her PW for Mychart so she could also log in to go over information as well... I offered to schedule a f/u with Dr Tobi Bastos to discuss, pt declined... Nothing further needed at this time

## 2023-10-14 ENCOUNTER — Ambulatory Visit (INDEPENDENT_AMBULATORY_CARE_PROVIDER_SITE_OTHER): Payer: Medicare Other | Admitting: Family Medicine

## 2023-10-14 ENCOUNTER — Other Ambulatory Visit: Payer: Self-pay | Admitting: Family Medicine

## 2023-10-14 ENCOUNTER — Encounter: Payer: Self-pay | Admitting: Family Medicine

## 2023-10-14 VITALS — BP 126/64 | HR 68 | Resp 18 | Ht 62.5 in | Wt 166.1 lb

## 2023-10-14 DIAGNOSIS — Z09 Encounter for follow-up examination after completed treatment for conditions other than malignant neoplasm: Secondary | ICD-10-CM | POA: Insufficient documentation

## 2023-10-14 DIAGNOSIS — Z6829 Body mass index (BMI) 29.0-29.9, adult: Secondary | ICD-10-CM | POA: Insufficient documentation

## 2023-10-14 DIAGNOSIS — E663 Overweight: Secondary | ICD-10-CM

## 2023-10-14 DIAGNOSIS — G43009 Migraine without aura, not intractable, without status migrainosus: Secondary | ICD-10-CM | POA: Diagnosis not present

## 2023-10-14 DIAGNOSIS — E7800 Pure hypercholesterolemia, unspecified: Secondary | ICD-10-CM | POA: Diagnosis not present

## 2023-10-14 DIAGNOSIS — E039 Hypothyroidism, unspecified: Secondary | ICD-10-CM

## 2023-10-14 DIAGNOSIS — Z8673 Personal history of transient ischemic attack (TIA), and cerebral infarction without residual deficits: Secondary | ICD-10-CM | POA: Diagnosis not present

## 2023-10-14 DIAGNOSIS — G4719 Other hypersomnia: Secondary | ICD-10-CM | POA: Diagnosis not present

## 2023-10-14 DIAGNOSIS — K760 Fatty (change of) liver, not elsewhere classified: Secondary | ICD-10-CM | POA: Diagnosis not present

## 2023-10-14 DIAGNOSIS — M810 Age-related osteoporosis without current pathological fracture: Secondary | ICD-10-CM

## 2023-10-14 MED ORDER — TIRZEPATIDE-WEIGHT MANAGEMENT 2.5 MG/0.5ML ~~LOC~~ SOLN: 2.5000 mg | SUBCUTANEOUS | 0 refills | Status: DC

## 2023-10-14 MED ORDER — LEVOTHYROXINE SODIUM 75 MCG PO TABS: ORAL_TABLET | ORAL | 0 refills | Status: DC

## 2023-10-14 MED ORDER — LEVOTHYROXINE SODIUM 88 MCG PO TABS: ORAL_TABLET | ORAL | 1 refills | Status: DC

## 2023-10-14 NOTE — Patient Instructions (Signed)
 For Bone Density scan - please call the [facility] [phone]) to schedule a routine screening mammogram.

## 2023-10-14 NOTE — Progress Notes (Signed)
Established patient visit   Patient: Nichole [name]   DOB: 1940/09/14   83 y.o. Female  MRN: [id number] Visit Date: 10/14/2023  Today's healthcare provider: Sherlyn Hay, DO   Chief Complaint  Patient presents with   Allergies   steatosis   Weight Check   Headache    Right sided   Subjective    Headache  Pertinent negatives include no abdominal pain, dizziness, fever, nausea, vomiting or weakness.  mAWV: 01/17/2023   Nichole [name] is an 83 year old female with fatty liver disease and osteoporosis who presents for dietary guidance and management of headaches.  She experiences worsening headaches that occur daily, primarily on the right side of her head, starting in the late afternoon or evening. The headaches are persistent and sharp. She is unable to take Tylenol due to concerns about her liver and cannot take ibuprofen because she is on plavix. She has a history of a cerebrovascular accident and is concerned about the headaches due to past experiences with similar symptoms preceding her CVA.  She is seeking dietary guidance for her liver condition and osteoporosis. She was informed by a previous doctor that she has fatty liver disease and is unsure about dietary restrictions, particularly regarding the use of Tylenol. A pharmacist advised against Tylenol due to her fatty liver. She is also concerned about her osteoporosis and is supposed to have a bone density test scheduled.  She has a history of thyroid issues and is currently on levothyroxine, which was adjusted to every other day alternating-dose dosing in May. She mentions issues with her levothyroxine prescriptions being switched back to old frequency/doses at the pharmacy. She also discusses her Cymbalta prescription, which her psychiatrist attempted to double, but she refused due to concerns about her liver.  She experiences significant sleep disturbances, sleeping excessively both day and night. Her daughter  has suggested a sleep study due to concerns about snoring and potential sleep apnea. No history of waking up gasping for breath unless having a nightmare.  She has a history of foot surgery due to bone chips and has residual issues with her sciatic nerve, causing discomfort in her knee and toes. She is able to perform some chair exercises and uses weighted gloves for arm exercises.     Medications: Outpatient Medications Prior to Visit  Medication Sig   albuterol (VENTOLIN HFA) 108 (90 Base) MCG/ACT inhaler Inhale 2 puffs into the lungs every 6 (six) hours as needed for shortness of breath or wheezing.   atorvastatin (LIPITOR) 40 MG tablet TAKE 1 TABLET BY MOUTH DAILY AT 6 PM.   budesonide (PULMICORT FLEXHALER) 180 MCG/ACT inhaler Inhale 2 puffs into the lungs daily.   busPIRone (BUSPAR) 15 MG tablet Take 30 mg by mouth 2 (two) times daily. 30 mg in the morning and 15 mg in the evening   clopidogrel (PLAVIX) 75 MG tablet TAKE 1 TABLET BY MOUTH EVERY DAY   DULoxetine (CYMBALTA) 60 MG capsule TAKE 1 CAPSULE BY MOUTH EVERY DAY   gabapentin (NEURONTIN) 600 MG tablet TAKE 2 TABLETS (1,200 MG TOTAL) BY MOUTH 2 (TWO) TIMES DAILY.   LORazepam (ATIVAN) 0.5 MG tablet Take 0.5 mg by mouth at bedtime.   oxyCODONE (OXY IR/ROXICODONE) 5 MG immediate release tablet Take 5 mg by mouth every 4 (four) hours as needed for severe pain.   pantoprazole (PROTONIX) 40 MG tablet TAKE 1 TABLET (40 MG TOTAL) BY MOUTH TWICE A DAY BEFORE MEALS   [DISCONTINUED]  levothyroxine (SYNTHROID) 75 MCG tablet TAKE ONE TABLET ON AN EMPTY STOMACH TWO DAYS A WEEK. (Patient taking differently: Alternate with Synthroid )   [DISCONTINUED] levothyroxine (SYNTHROID) 88 MCG tablet TAKE ONE TABLET ON AN EMPTY STOMACH FIVE DAYS A WEEK. (Patient taking differently: Alternate with Synthroid )   Cholecalciferol 50 MCG (2000 UT) CAPS Take by mouth as needed. (Patient not taking: Reported on 10/14/2023)   polyethylene glycol powder  (GLYCOLAX/MIRALAX) powder Take 255 g by mouth once. (Patient not taking: Reported on 10/14/2023)   No facility-administered medications prior to visit.    Review of Systems  Constitutional:  Negative for appetite change, chills, fatigue and fever.  Respiratory:  Negative for chest tightness and shortness of breath.   Cardiovascular:  Negative for chest pain and palpitations.  Gastrointestinal:  Negative for abdominal pain, nausea and vomiting.  Neurological:  Positive for headaches. Negative for dizziness and weakness.        Objective    BP 126/64   Pulse 68   Resp 18   Ht 5' 2.5" (1.588 m)   Wt 166 lb 1.6 oz (75.3 kg)   SpO2 98%   BMI 29.90 kg/m     Physical Exam Vitals and nursing note reviewed.  Constitutional:      General: She is not in acute distress.    Appearance: Normal appearance.  HENT:     Head: Normocephalic and atraumatic.  Eyes:     General: No scleral icterus.    Conjunctiva/sclera: Conjunctivae normal.  Cardiovascular:     Rate and Rhythm: Normal rate.  Pulmonary:     Effort: Pulmonary effort is normal.  Neurological:     Mental Status: She is alert and oriented to person, place, and time. Mental status is at baseline.  Psychiatric:        Mood and Affect: Mood normal.        Behavior: Behavior normal.      Results for orders placed or performed in visit on 10/14/23  TSH  Result Value Ref Range   TSH 3.010 0.450 - 4.500 uIU/mL  Comprehensive metabolic panel  Result Value Ref Range   Glucose 79 70 - 99 mg/dL   BUN 11 8 - 27 mg/dL   Creatinine, Ser 1.61 0.57 - 1.00 mg/dL   eGFR 87 >09 UE/AVW/0.98   BUN/Creatinine Ratio 16 12 - 28   Sodium 144 134 - 144 mmol/L   Potassium 4.5 3.5 - 5.2 mmol/L   Chloride 105 96 - 106 mmol/L   CO2 21 20 - 29 mmol/L   Calcium 9.0 8.7 - 10.3 mg/dL   Total Protein 6.1 6.0 - 8.5 g/dL   Albumin 4.0 3.8 - 4.8 g/dL   Globulin, Total 2.1 1.5 - 4.5 g/dL   Bilirubin Total 0.2 0.0 - 1.2 mg/dL   Alkaline  Phosphatase 118 44 - 121 IU/L   AST 18 0 - 40 IU/L   ALT 11 0 - 32 IU/L    Assessment & Plan    Follow-up exam, 3-6 months since previous exam Assessment & Plan: Discussed the importance of regular screenings and vaccinations. Plans to get the COVID booster and has a mammogram scheduled. - Encourage getting the COVID booster - Schedule mammogram at [facility]   Age-related osteoporosis without current pathological fracture Assessment & Plan: Osteoporosis with a previous bone density scan in 2021. Due for a follow-up scan and taking vitamin D supplements. Discussed the importance of regular monitoring and supplementation. - Order bone density scan -  Confirm vitamin D supplementation  Orders: -     Amb ref to Medical Nutrition Therapy-MNT  Hepatic steatosis Assessment & Plan: Diagnosed with fatty liver disease. Concerned about medication impact and overall health. Emphasized dietary management and clarified that Tylenol can be taken in moderation. Discussed avoiding fatty foods and incorporating more vegetables. - Refer to dietician for dietary guidance - Order metabolic panel to recheck liver enzymes - Provide information on a fatty liver diet  Orders: -     Amb ref to Medical Nutrition Therapy-MNT  Acquired hypothyroidism Assessment & Plan: Currently on levothyroxine with a dosing schedule of every other day. Reported issues with prescription refills. Discussed the need for consistent dosing and monitoring thyroid levels. - Send new prescription for levothyroxine with correct dosing schedule - Order thyroid level test  Orders: -     Levothyroxine Sodium; TAKE ONE TABLET ON AN EMPTY STOMACH every other day (alternating with synthroid 75 mcg)  Dispense: 60 tablet; Refill: 1 -     Levothyroxine Sodium; TAKE ONE TABLET ON AN EMPTY STOMACH every other day (alternating with synthroid  88 mcg)  Dispense: 90 tablet; Refill: 0 -     TSH -     Comprehensive metabolic  panel  Atypical migraine Assessment & Plan: Persistent daily headaches localized to the right side, typically occurring in the late afternoon or evening. Unable to take ibuprofen due to anticoagulants. Clarified that Tylenol can be taken in moderation despite fatty liver disease. 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 Discussed that Tylenol with caffeine can be effective for migraines. - Recommend low-dose Tylenol for headaches - Advise taking Tylenol with coffee to enhance efficacy - Follow up in three months or sooner if headaches do not improve - Continue cymbalta   History of CVA (cerebrovascular accident) Assessment & Plan: Continue Lipitor and Plavix Discussed importance of good blood pressure and cholesterol control Followed by neurology   Overweight with body mass index (BMI) of 29 to 29.9 in adult Assessment & Plan: Counseled on diet/exercise.   Excessive daytime sleepiness Assessment & Plan: Reported excessive sleep and snoring, with a suggestion from her daughter to have a sleep study. Does not wake up gasping for breath except during nightmares. Discussed the potential benefits of a sleep study. - Order sleep study  Orders: -     Ambulatory referral to Sleep Studies  Hypercholesteremia Assessment & Plan: Previously well controlled Continue statin     Return in about 14 weeks (around 01/20/2024), or if symptoms worsen or fail to improve, for mAWV, HA.      I discussed the assessment and treatment plan with the patient  The patient was provided an opportunity to ask questions and all were answered. The patient agreed with the plan and demonstrated an understanding of the instructions.   The patient was advised to call back or seek an in-person evaluation if the symptoms worsen or if the condition fails to improve as anticipated.  Total time was 40 minutes. That includes chart review before the visit, the actual patient visit, and time spent on  documentation after the visit.    Sherlyn Hay, DO  [facility] [phone] (phone) [phone] (fax)  [facility]

## 2023-10-15 LAB — COMPREHENSIVE METABOLIC PANEL
ALT: 11 [IU]/L (ref 0–32)
AST: 18 [IU]/L (ref 0–40)
Albumin: 4 g/dL (ref 3.8–4.8)
Alkaline Phosphatase: 118 [IU]/L (ref 44–121)
BUN/Creatinine Ratio: 16 (ref 12–28)
BUN: 11 mg/dL (ref 8–27)
Bilirubin Total: 0.2 mg/dL (ref 0.0–1.2)
CO2: 21 mmol/L (ref 20–29)
Calcium: 9 mg/dL (ref 8.7–10.3)
Chloride: 105 mmol/L (ref 96–106)
Creatinine, Ser: 0.7 mg/dL (ref 0.57–1.00)
Globulin, Total: 2.1 g/dL (ref 1.5–4.5)
Glucose: 79 mg/dL (ref 70–99)
Potassium: 4.5 mmol/L (ref 3.5–5.2)
Sodium: 144 mmol/L (ref 134–144)
Total Protein: 6.1 g/dL (ref 6.0–8.5)
eGFR: 87 mL/min/{1.73_m2} (ref >59)

## 2023-10-15 LAB — TSH: TSH: 3.01 u[IU]/mL (ref 0.450–4.500)

## 2023-10-15 NOTE — Telephone Encounter (Signed)
Requested medication (s) are due for refill today:     Requested medication (s) are on the active medication list:    Future visit scheduled:      [facility]  See phar note for Zepbound.   Requested Prescriptions  Pending Prescriptions Disp Refills   ZEPBOUND 2.5 MG/0.5ML injection vial [Pharmacy Med Name: ZEPBOUND 2.5 MG/0.5 ML VIAL] 2 mL 0    Sig: INJECT 2.5 MG SUBCUTANEOUSLY WEEKLY     There is no refill protocol information for this order

## 2023-10-16 ENCOUNTER — Other Ambulatory Visit: Payer: Self-pay | Admitting: "Endocrinology

## 2023-10-17 MED ORDER — TIRZEPATIDE-WEIGHT MANAGEMENT 2.5 MG/0.5ML ~~LOC~~ SOLN
2.5000 mg | SUBCUTANEOUS | 2 refills | Status: DC
Start: 2023-10-17 — End: 2024-01-21

## 2023-10-21 ENCOUNTER — Encounter: Payer: Self-pay | Admitting: Family Medicine

## 2023-10-21 DIAGNOSIS — G4719 Other hypersomnia: Secondary | ICD-10-CM | POA: Insufficient documentation

## 2023-10-21 NOTE — Assessment & Plan Note (Signed)
Continue Lipitor and Plavix Discussed importance of good blood pressure and cholesterol control Followed by neurology

## 2023-10-21 NOTE — Assessment & Plan Note (Signed)
 Counseled on diet/exercise

## 2023-10-21 NOTE — Assessment & Plan Note (Signed)
Persistent daily headaches localized to the right side, typically occurring in the late afternoon or evening. Unable to take ibuprofen due to anticoagulants. Clarified that Tylenol can be taken in moderation despite fatty liver disease. 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 Discussed that Tylenol with caffeine can be effective for migraines. - Recommend low-dose Tylenol for headaches - Advise taking Tylenol with coffee to enhance efficacy - Follow up in three months or sooner if headaches do not improve - Continue cymbalta

## 2023-10-21 NOTE — Assessment & Plan Note (Signed)
Reported excessive sleep and snoring, with a suggestion from her daughter to have a sleep study. Does not wake up gasping for breath except during nightmares. Discussed the potential benefits of a sleep study. - Order sleep study

## 2023-10-21 NOTE — Assessment & Plan Note (Signed)
Discussed the importance of regular screenings and vaccinations. Plans to get the COVID booster and has a mammogram scheduled. - Encourage getting the COVID booster - Schedule mammogram at [facility]

## 2023-10-21 NOTE — Assessment & Plan Note (Signed)
Diagnosed with fatty liver disease. Concerned about medication impact and overall health. Emphasized dietary management and clarified that Tylenol can be taken in moderation. Discussed avoiding fatty foods and incorporating more vegetables. - Refer to dietician for dietary guidance - Order metabolic panel to recheck liver enzymes - Provide information on a fatty liver diet

## 2023-10-21 NOTE — Assessment & Plan Note (Signed)
Currently on levothyroxine with a dosing schedule of every other day. Reported issues with prescription refills. Discussed the need for consistent dosing and monitoring thyroid levels. - Send new prescription for levothyroxine with correct dosing schedule - Order thyroid level test

## 2023-10-21 NOTE — Assessment & Plan Note (Signed)
Osteoporosis with a previous bone density scan in 2021. Due for a follow-up scan and taking vitamin D supplements. Discussed the importance of regular monitoring and supplementation. - Order bone density scan - Confirm vitamin D supplementation

## 2023-10-21 NOTE — Assessment & Plan Note (Signed)
Previously well controlled Continue statin 

## 2023-10-25 ENCOUNTER — Other Ambulatory Visit: Payer: Self-pay | Admitting: Family Medicine

## 2023-10-25 NOTE — Telephone Encounter (Signed)
Copied from CRM [id number]. Topic: Clinical - Medication Refill >> Oct 25, 2023  3:09 PM Dondra Prader A wrote: Most Recent Primary Care Visit:  Provider: Sherlyn Hay  Department: [facility]  Visit Type: OFFICE VISIT  Date: 05/28/2023  Medication: gabapentin (NEURONTIN) 600 MG tablet    Has the patient contacted their pharmacy? Yes Patient has no refills  Is this the correct pharmacy for this prescription? Yes  This is the patient's preferred pharmacy:  [id number] [place] [place] - [address] [place] [address] Phone: [phone] Fax: [phone]     Has the prescription been filled recently? No  Is the patient out of the medication? No  Has the patient been seen for an appointment in the last year OR does the patient have an upcoming appointment? Yes  Can we respond through MyChart? No  Agent: Please be advised that Rx refills may take up to 3 business days. We ask that you follow-up with your pharmacy.

## 2023-10-27 ENCOUNTER — Other Ambulatory Visit: Payer: Self-pay | Admitting: Family Medicine

## 2023-10-28 MED ORDER — GABAPENTIN 600 MG PO TABS: 1200.0000 mg | ORAL_TABLET | Freq: Two times a day (BID) | ORAL | 1 refills | Status: DC

## 2023-11-05 DIAGNOSIS — Z23 Encounter for immunization: Secondary | ICD-10-CM | POA: Diagnosis not present

## 2023-11-22 ENCOUNTER — Other Ambulatory Visit: Payer: Self-pay | Admitting: Family Medicine

## 2023-11-27 ENCOUNTER — Other Ambulatory Visit: Payer: Self-pay | Admitting: Family Medicine

## 2023-11-27 DIAGNOSIS — Z1231 Encounter for screening mammogram for malignant neoplasm of breast: Secondary | ICD-10-CM

## 2023-11-27 DIAGNOSIS — M79671 Pain in right foot: Secondary | ICD-10-CM | POA: Diagnosis not present

## 2023-11-27 DIAGNOSIS — M25551 Pain in right hip: Secondary | ICD-10-CM | POA: Diagnosis not present

## 2023-11-27 DIAGNOSIS — M25571 Pain in right ankle and joints of right foot: Secondary | ICD-10-CM | POA: Diagnosis not present

## 2023-11-28 ENCOUNTER — Other Ambulatory Visit: Payer: Self-pay | Admitting: Family Medicine

## 2023-11-29 NOTE — Telephone Encounter (Signed)
 Requested medication (s) are due for refill today: yes  Requested medication (s) are on the active medication list: yes  Last refill:  05/10/23 #90/1  Future visit scheduled: yes  Notes to clinic:  Unable to refill per protocol due to failed labs, no updated results.      Requested Prescriptions  Pending Prescriptions Disp Refills   clopidogrel (PLAVIX) 75 MG tablet [Pharmacy Med Name: CLOPIDOGREL 75 MG TABLET] 90 tablet 1    Sig: TAKE 1 TABLET BY MOUTH EVERY DAY     Hematology: Antiplatelets - clopidogrel Failed - 11/29/2023 11:41 AM      Failed - HCT in normal range and within 180 days    Hematocrit  Date Value Ref Range Status  11/30/2022 42.0 34.0 - 46.6 % Final         Failed - HGB in normal range and within 180 days    Hemoglobin  Date Value Ref Range Status  11/30/2022 14.8 11.1 - 15.9 g/dL Final         Failed - PLT in normal range and within 180 days    Platelets  Date Value Ref Range Status  11/30/2022 244 150 - 450 x10E3/uL Final         Passed - Cr in normal range and within 360 days    Creat  Date Value Ref Range Status  09/18/2023 0.82 0.60 - 0.95 mg/dL Final   Creatinine, Ser  Date Value Ref Range Status  10/14/2023 0.70 0.57 - 1.00 mg/dL Final         Passed - Valid encounter within last 6 months    Recent Outpatient Visits           1 month ago Follow-up exam, 3-6 months since previous exam   [facility] Pard[facility], Monico Blitz, DO       Future Appointments             In 1 month Pardue, Monico Blitz, DO [facility], [facility]

## 2023-12-03 ENCOUNTER — Other Ambulatory Visit: Payer: Self-pay | Admitting: Family Medicine

## 2023-12-03 NOTE — Telephone Encounter (Signed)
 Copied from CRM [id number]. Topic: Clinical - Medication Refill >> Dec 03, 2023 12:33 PM Carlatta H wrote: Most Recent Primary Care Visit:  Provider: Sherlyn Hay  Department: [facility]  Visit Type: OFFICE VISIT  Date: 10/14/2023  Medication: clopidogrel (PLAVIX) 75 MG tablet [[id number]]  Has the patient contacted their pharmacy? No (Agent: If no, request that the patient contact the pharmacy for the refill. If patient does not wish to contact the pharmacy document the reason why and proceed with request.) (Agent: If yes, when and what did the pharmacy advise?)  Is this the correct pharmacy for this prescription? Yes If no, delete pharmacy and type the correct one.  This is the patient's preferred pharmacy:  [id number] [place], [place] - [address] [place] [address] Phone: [phone] Fax: [phone]     Has the prescription been filled recently? No  Is the patient out of the medication? Yes  Has the patient been seen for an appointment in the last year OR does the patient have an upcoming appointment? Yes  Can we respond through MyChart? No  Agent: Please be advised that Rx refills may take up to 3 business days. We ask that you follow-up with your pharmacy.

## 2023-12-05 NOTE — Telephone Encounter (Signed)
 Requested medication (s) are due for refill today:   Yes  Requested medication (s) are on the active medication list:   Yes  Future visit scheduled:   No  LOV 10/14/2023.   Last ordered: 12/04/2023 #90, 1 refill  Unable to refill because labs are due per protocol.    Last CBC 11/30/2022.   Per Dr. Rexanne Mano note dated 10/21/2023 at 7:08 AM pt is to continue the Plavix.   Dr. Payton Mccallum to review.   Requested Prescriptions  Pending Prescriptions Disp Refills   clopidogrel (PLAVIX) 75 MG tablet 90 tablet 1    Sig: Take 1 tablet (75 mg total) by mouth daily.     Hematology: Antiplatelets - clopidogrel Failed - 12/05/2023 11:00 AM      Failed - HCT in normal range and within 180 days    Hematocrit  Date Value Ref Range Status  11/30/2022 42.0 34.0 - 46.6 % Final         Failed - HGB in normal range and within 180 days    Hemoglobin  Date Value Ref Range Status  11/30/2022 14.8 11.1 - 15.9 g/dL Final         Failed - PLT in normal range and within 180 days    Platelets  Date Value Ref Range Status  11/30/2022 244 150 - 450 x10E3/uL Final         Passed - Cr in normal range and within 360 days    Creat  Date Value Ref Range Status  09/18/2023 0.82 0.60 - 0.95 mg/dL Final   Creatinine, Ser  Date Value Ref Range Status  10/14/2023 0.70 0.57 - 1.00 mg/dL Final         Passed - Valid encounter within last 6 months    Recent Outpatient Visits           1 month ago Follow-up exam, 3-6 months since previous exam   [facility] Pardue, Monico Blitz, DO       Future Appointments             In 1 month Pardue, Monico Blitz, DO [place] [facility], [facility]

## 2023-12-10 DIAGNOSIS — G4733 Obstructive sleep apnea (adult) (pediatric): Secondary | ICD-10-CM | POA: Diagnosis not present

## 2023-12-12 ENCOUNTER — Ambulatory Visit
Admission: RE | Admit: 2023-12-12 | Discharge: 2023-12-12 | Disposition: A | Discharge status: Home / Self Care | Source: Ambulatory Visit | Attending: "Endocrinology | Admitting: "Endocrinology

## 2023-12-12 ENCOUNTER — Ambulatory Visit
Admission: RE | Admit: 2023-12-12 | Discharge: 2023-12-12 | Disposition: A | Discharge status: Home / Self Care | Source: Ambulatory Visit | Attending: Family Medicine | Admitting: Family Medicine

## 2023-12-12 DIAGNOSIS — M810 Age-related osteoporosis without current pathological fracture: Secondary | ICD-10-CM | POA: Insufficient documentation

## 2023-12-12 DIAGNOSIS — Z1231 Encounter for screening mammogram for malignant neoplasm of breast: Secondary | ICD-10-CM | POA: Insufficient documentation

## 2023-12-12 DIAGNOSIS — Z780 Asymptomatic menopausal state: Secondary | ICD-10-CM | POA: Diagnosis not present

## 2023-12-17 ENCOUNTER — Encounter: Payer: Self-pay | Admitting: "Endocrinology

## 2023-12-17 ENCOUNTER — Ambulatory Visit (INDEPENDENT_AMBULATORY_CARE_PROVIDER_SITE_OTHER): Payer: Medicare Other | Admitting: "Endocrinology

## 2023-12-17 VITALS — BP 130/80 | HR 67 | Ht 62.5 in | Wt 162.0 lb

## 2023-12-17 DIAGNOSIS — M810 Age-related osteoporosis without current pathological fracture: Secondary | ICD-10-CM
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
## 2023-12-18 LAB — COMPREHENSIVE METABOLIC PANEL WITH GFR
AG Ratio: 2.2 (calc) (ref 1.0–2.5)
ALT: 11 U/L (ref 6–29)
AST: 16 U/L (ref 10–35)
Albumin: 4.4 g/dL (ref 3.6–5.1)
Alkaline phosphatase (APISO): 109 U/L (ref 37–153)
BUN: 9 mg/dL (ref 7–25)
CO2: 29 mmol/L (ref 20–32)
Calcium: 9.2 mg/dL (ref 8.6–10.4)
Chloride: 103 mmol/L (ref 98–110)
Creat: 0.77 mg/dL (ref 0.60–0.95)
Globulin: 2 g/dL (ref 1.9–3.7)
Glucose, Bld: 81 mg/dL (ref 65–99)
Potassium: 4.6 mmol/L (ref 3.5–5.3)
Sodium: 139 mmol/L (ref 135–146)
Total Bilirubin: 0.4 mg/dL (ref 0.2–1.2)
Total Protein: 6.4 g/dL (ref 6.1–8.1)
eGFR: 78 mL/min/{1.73_m2} (ref >=60)

## 2023-12-23 ENCOUNTER — Encounter: Payer: Self-pay | Admitting: Family Medicine
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
## 2023-12-24 ENCOUNTER — Encounter: Payer: Self-pay | Admitting: Neurology

## 2023-12-24 ENCOUNTER — Ambulatory Visit (INDEPENDENT_AMBULATORY_CARE_PROVIDER_SITE_OTHER): Payer: Medicare Other | Admitting: Neurology

## 2023-12-24 VITALS — BP 136/77 | HR 68 | Ht 62.0 in | Wt 161.0 lb

## 2023-12-24 DIAGNOSIS — R2681 Unsteadiness on feet: Secondary | ICD-10-CM | POA: Diagnosis not present

## 2023-12-24 DIAGNOSIS — G43009 Migraine without aura, not intractable, without status migrainosus: Secondary | ICD-10-CM | POA: Diagnosis not present

## 2023-12-24 DIAGNOSIS — G44309 Post-traumatic headache, unspecified, not intractable: Secondary | ICD-10-CM | POA: Diagnosis not present

## 2023-12-24 DIAGNOSIS — Z87820 Personal history of traumatic brain injury: Secondary | ICD-10-CM | POA: Diagnosis not present

## 2023-12-24 MED ORDER — DULOXETINE HCL 60 MG PO CPEP: ORAL_CAPSULE | ORAL | 11 refills | Status: AC

## 2023-12-24 NOTE — Patient Instructions (Signed)
 DULOXETINE  60MG  DAILY

## 2023-12-29 ENCOUNTER — Other Ambulatory Visit: Payer: Self-pay | Admitting: Family Medicine

## 2023-12-31 NOTE — Telephone Encounter (Signed)
 Requested Prescriptions  Pending Prescriptions Disp Refills   atorvastatin  (LIPITOR) 40 MG tablet [Pharmacy Med Name: ATORVASTATIN  40 MG TABLET] 30 tablet 0    Sig: TAKE 1 TABLET BY MOUTH DAILY AT 6 PM.     Cardiovascular:  Antilipid - Statins Failed - 12/31/2023  8:53 AM      Failed - Lipid Panel in normal range within the last 12 months    Cholesterol, Total  Date Value Ref Range Status  01/17/2023 135 100 - 199 mg/dL Final   LDL Chol Calc (NIH)  Date Value Ref Range Status  01/17/2023 65 0 - 99 mg/dL Final   HDL  Date Value Ref Range Status  01/17/2023 54 >39 mg/dL Final   Triglycerides  Date Value Ref Range Status  01/17/2023 85 0 - 149 mg/dL Final         Passed - Patient is not pregnant      Passed - Valid encounter within last 12 months    Recent Outpatient Visits           2 months ago Follow-up exam, 3-6 months since previous exam   [facility] Pardue, Asencion Blacksmith, DO       Future Appointments             In 3 weeks Pardue, Asencion Blacksmith, DO [facility]

## 2024-01-14 ENCOUNTER — Other Ambulatory Visit: Payer: Self-pay | Admitting: Family Medicine

## 2024-01-14 DIAGNOSIS — E039 Hypothyroidism, unspecified: Secondary | ICD-10-CM

## 2024-01-20 ENCOUNTER — Ambulatory Visit: Payer: Self-pay

## 2024-01-20 ENCOUNTER — Telehealth: Payer: Self-pay

## 2024-01-20 NOTE — Telephone Encounter (Signed)
Prolia VOB initiated via [email or web address]  Next Prolia inj DUE: new start

## 2024-01-20 NOTE — Telephone Encounter (Signed)
 Pt is starting prolia.

## 2024-01-21 ENCOUNTER — Ambulatory Visit: Payer: Medicare Other | Admitting: Family Medicine

## 2024-01-21 ENCOUNTER — Encounter: Payer: Self-pay | Admitting: Family Medicine

## 2024-01-21 VITALS — BP 107/53 | HR 68 | Temp 97.8°F | Ht 62.0 in | Wt 161.5 lb

## 2024-01-21 DIAGNOSIS — N182 Chronic kidney disease, stage 2 (mild): Secondary | ICD-10-CM

## 2024-01-21 DIAGNOSIS — Z8673 Personal history of transient ischemic attack (TIA), and cerebral infarction without residual deficits: Secondary | ICD-10-CM

## 2024-01-21 DIAGNOSIS — E039 Hypothyroidism, unspecified: Secondary | ICD-10-CM

## 2024-01-21 DIAGNOSIS — Z09 Encounter for follow-up examination after completed treatment for conditions other than malignant neoplasm: Secondary | ICD-10-CM

## 2024-01-21 DIAGNOSIS — K219 Gastro-esophageal reflux disease without esophagitis: Secondary | ICD-10-CM | POA: Diagnosis not present

## 2024-01-21 DIAGNOSIS — G4733 Obstructive sleep apnea (adult) (pediatric): Secondary | ICD-10-CM | POA: Diagnosis not present

## 2024-01-21 DIAGNOSIS — G43009 Migraine without aura, not intractable, without status migrainosus: Secondary | ICD-10-CM

## 2024-01-21 DIAGNOSIS — F411 Generalized anxiety disorder: Secondary | ICD-10-CM

## 2024-01-21 DIAGNOSIS — F410 Panic disorder [episodic paroxysmal anxiety] without agoraphobia: Secondary | ICD-10-CM | POA: Diagnosis not present

## 2024-01-21 DIAGNOSIS — F4310 Post-traumatic stress disorder, unspecified: Secondary | ICD-10-CM

## 2024-01-21 DIAGNOSIS — E7800 Pure hypercholesterolemia, unspecified: Secondary | ICD-10-CM

## 2024-01-21 MED ORDER — ATORVASTATIN CALCIUM 40 MG PO TABS: 40.0000 mg | ORAL_TABLET | Freq: Every day | ORAL | 1 refills | Status: DC

## 2024-01-21 MED ORDER — TIRZEPATIDE-WEIGHT MANAGEMENT 2.5 MG/0.5ML ~~LOC~~ SOLN: 2.5000 mg | SUBCUTANEOUS | 2 refills | Status: DC

## 2024-01-21 MED ORDER — PANTOPRAZOLE SODIUM 40 MG PO TBEC: DELAYED_RELEASE_TABLET | ORAL | 2 refills | Status: DC

## 2024-01-21 NOTE — Progress Notes (Signed)
 Established patient visit   Patient: Nichole [name]   DOB: 09-13-1940   83 y.o. Female  MRN: [id number] Visit Date: 01/21/2024  Today's healthcare provider: Carlean Charter, DO   Chief Complaint  Patient presents with   Medical Management of Chronic Issues    Patient states that she has leg and knee pain that she also using Voltaren for relief.  Referral would like a referral for a psychiatrist in [place].  Pt would also like to stop Lorazepam to taper off due to it causing peripheral neuropathy.   Neck Pain    Reports the whole back of her neck hurts, she thinks she may have pulled a muscle while hugging and grabbing he grandson. Patient has been Voltaren Gel on for treatment she reports that it does help.   Hypothyroidism    Nichole [name] is a 83 y.o. female who presents for follow up of hypothyroidism. Current symptoms: change in energy level and nervousness. Reports her energy level is abnormal, she can go for 8 hours one day and the next day be exhausted.  Patient denies diarrhea, heat / cold intolerance, palpitations, and weight changes. Symptoms have gradually worsened.    Subjective    Neck Pain    mAWV scheduled for 03/17/24   Nichole [name] is an 83 year old female with anxiety and sleep disturbances who presents for medication management and follow-up.  She experiences decreased energy levels and feels she could sleep all day. Her anxiety has worsened due to stress related to her daughter's illness and care needs. She has difficulty thinking of common words and increased nervousness.  She has been using lorazepam for sleep for about a year but was advised she needs to be weaned off due to burning sensations in her toes, shin, and knee. She has not tried taking a half tablet due to her small size. She also uses gabapentin  for fibromyalgia, which she finds effective in managing pain. She takes buspirone, which helps her feel normal after 30 minutes of taking  it. She is also on duloxetine , which a previous provider had advised her to double and which she chose not to do, due to concerns about adverse effects on her liver health.  She has not received her CPAP machine despite a previous order, and she is unsure of her sleep study results. She has never used a CPAP before and is interested in discussing it further with a sleep provider.  She has a history of fibromyalgia, for which she takes gabapentin . She also mentions a history of foot surgery, after which she experienced burning sensations in her foot. She uses Voltaren and Nurvive for pain management.  She has a history of shingles and received the first dose of the shingles vaccine in 2018, with a bad reaction to a previous live vaccine. She has had shingles twice before, at ages 85 and 7.  She is managing multiple medications, including atorvastatin  for cholesterol, and is concerned about the cost and supply of her medications. She mentions a history of stroke affecting her left hand function, impacting her ability to use a partial denture.      Medications: Outpatient Medications Prior to Visit  Medication Sig   albuterol  (VENTOLIN  HFA) 108 (90 Base) MCG/ACT inhaler Inhale 2 puffs into the lungs as needed for shortness of breath or wheezing (Maybe 2 months in the spring.).   busPIRone (BUSPAR) 15 MG tablet Take 30 mg by mouth. 30 mg in the morning and  15 mg in the evening   Cholecalciferol 50 MCG (2000 UT) CAPS Take by mouth as needed.   clopidogrel  (PLAVIX ) 75 MG tablet TAKE 1 TABLET BY MOUTH EVERY DAY   DULoxetine  (CYMBALTA ) 60 MG capsule TAKE 1 CAPSULE BY MOUTH EVERY DAY   gabapentin  (NEURONTIN ) 600 MG tablet Take 2 tablets (1,200 mg total) by mouth 2 (two) times daily.   levothyroxine  (SYNTHROID ) 75 MCG tablet TAKE ONE TABLET ON AN EMPTY STOMACH EVERY OTHER DAY (ALTERNATING WITH SYNTHROID  88 MCG)   levothyroxine  (SYNTHROID ) 88 MCG tablet TAKE ONE TABLET ON AN EMPTY STOMACH every other  day (alternating with synthroid  75 mcg)   LORazepam (ATIVAN) 0.5 MG tablet Take 0.5 mg by mouth at bedtime.   oxyCODONE (OXY IR/ROXICODONE) 5 MG immediate release tablet Take 5 mg by mouth every 4 (four) hours as needed for severe pain.   polyethylene glycol powder (GLYCOLAX /MIRALAX ) powder Take 255 g by mouth once.   [DISCONTINUED] atorvastatin  (LIPITOR) 40 MG tablet TAKE 1 TABLET BY MOUTH DAILY AT 6 PM.   [DISCONTINUED] pantoprazole  (PROTONIX ) 40 MG tablet TAKE 1 TABLET (40 MG TOTAL) BY MOUTH TWICE A DAY BEFORE MEALS   [DISCONTINUED] tirzepatide  (ZEPBOUND ) 2.5 MG/0.5ML injection vial Inject 2.5 mg into the skin once a week.   budesonide (PULMICORT FLEXHALER) 180 MCG/ACT inhaler Inhale 2 puffs into the lungs daily. (Patient not taking: Reported on 01/21/2024)   No facility-administered medications prior to visit.    Review of Systems  Musculoskeletal:  Positive for neck pain.        Objective    BP (!) 107/53 (BP Location: Right Arm, Patient Position: Sitting)   Pulse 68   Temp 97.8 F (36.6 C) (Oral)   Ht 5\' 2"  (1.575 m)   Wt 161 lb 8 oz (73.3 kg)   SpO2 99%   BMI 29.54 kg/m  sss   Physical Exam Vitals and nursing note reviewed.  Constitutional:      General: She is not in acute distress.    Appearance: Normal appearance.  HENT:     Head: Normocephalic and atraumatic.  Eyes:     General: No scleral icterus.    Conjunctiva/sclera: Conjunctivae normal.  Cardiovascular:     Rate and Rhythm: Normal rate.  Pulmonary:     Effort: Pulmonary effort is normal.  Neurological:     Mental Status: She is alert and oriented to person, place, and time. Mental status is at baseline.  Psychiatric:        Mood and Affect: Mood normal.        Behavior: Behavior normal.      Results for orders placed or performed in visit on 01/21/24  TSH  Result Value Ref Range   TSH 5.030 (H) 0.450 - 4.500 uIU/mL    Assessment & Plan    Follow-up exam, 3-6 months since previous  exam  Chronic kidney disease, stage 2, mildly decreased GFR -     Tirzepatide -Weight Management; Inject 2.5 mg into the skin once a week.  Dispense: 2 mL; Refill: 2  PTSD (post-traumatic stress disorder) -     Ambulatory referral to Psychiatry  Generalized anxiety disorder with panic attacks -     Ambulatory referral to Psychiatry  Moderate obstructive sleep apnea -     Ambulatory referral to Sleep Studies -     Tirzepatide -Weight Management; Inject 2.5 mg into the skin once a week.  Dispense: 2 mL; Refill: 2  Acquired hypothyroidism -     TSH  History  of CVA (cerebrovascular accident) -     Atorvastatin  Calcium ; Take 1 tablet (40 mg total) by mouth daily.  Dispense: 90 tablet; Refill: 1  Hypercholesteremia -     Atorvastatin  Calcium ; Take 1 tablet (40 mg total) by mouth daily.  Dispense: 90 tablet; Refill: 1  Gastroesophageal reflux disease, unspecified whether esophagitis present -     Pantoprazole  Sodium; TAKE 1 TABLET (40 MG TOTAL) BY MOUTH TWICE A DAY BEFORE MEALS  Dispense: 60 tablet; Refill: 2     Anxiety Anxiety exacerbated by stress and medication issues. Current regimen effective. Lorazepam contributes to neuropathy symptoms. Difficulty with psychiatrist, seeks referral. - Refer to psychiatry for medication management and anxiety treatment. - Wean off lorazepam by taking half a tablet daily for two weeks, then every other day, and potentially every third day, depending on tolerance. - Consider using doxylamine over-the-counter for sleep as needed.  Chronic kidney disease stage 2 Continue to optimize risk factors. No acute concerns.  Continue to monitor.   Moderate obstructive sleep apnea Moderate sleep apnea diagnosed. CPAP not received due to insurance issues. Interested in discussing CPAP options. - Refer to sleep studies to facilitate CPAP acquisition and discuss CPAP options with a sleep provider.  Fibromyalgia Fibromyalgia managed effectively with  gabapentin . Significant pain relief reported.  Chronic fatigue Likely related to fibromyalgia and OSA. However, will check TSH to see if abnormal level is contributing factor. - Order TSH.  Fatty liver Fatty liver confirmed. Normal liver enzymes. Managing with diet and exercise.  History of CVA Stroke with residual hand weakness. No new symptoms.  No acute concerns.  Continue to monitor.   General Health Maintenance Due for second shingles vaccine. Previous reaction to live vaccine discussed. Scheduled for annual wellness visit. - Administer second shingles vaccine. - Complete annual wellness visit in July.    Return in about 5 months (around 06/08/2024).      I discussed the assessment and treatment plan with the patient  The patient was provided an opportunity to ask questions and all were answered. The patient agreed with the plan and demonstrated an understanding of the instructions.   The patient was advised to call back or seek an in-person evaluation if the symptoms worsen or if the condition fails to improve as anticipated.    Carlean Charter, DO  [facility] [phone] (phone) [phone] (fax)  [facility]

## 2024-01-21 NOTE — Patient Instructions (Addendum)
 Get lorazepam cut in half  - then take one 1/2 tablet nightly for two weeks  - if doing well, switch to one 1/2 tablet every other day   Recommended vaccine at the pharmacy: Shingrix for shingles and RSV if not already received.

## 2024-01-22 LAB — TSH: TSH: 5.03 u[IU]/mL — ABNORMAL HIGH (ref 0.450–4.500)

## 2024-01-22 NOTE — Telephone Encounter (Signed)
 Pending

## 2024-01-23 ENCOUNTER — Ambulatory Visit: Payer: Self-pay | Admitting: Family Medicine

## 2024-01-25 NOTE — Telephone Encounter (Signed)
 Prior Authorization initiated for PROLIA via [employer] Case ID: [id number]

## 2024-01-25 NOTE — Telephone Encounter (Signed)
 Medical Buy and Bill  Primary Medicare: Prior Authorization NOT required  Secondary Aetna SHP: Prior Authorization REQUIRED for PROLIA  PA PROCESS DETAILS: PA is required and is currently not on file. Please call Medical Review at [phone] or complete the PA form available at  [email or web address]   Fax completed form to [phone]. Or submit PA  request online at [email or web address].

## 2024-01-28 DIAGNOSIS — F331 Major depressive disorder, recurrent, moderate: Secondary | ICD-10-CM | POA: Diagnosis not present

## 2024-01-28 NOTE — Telephone Encounter (Signed)
 Medical Buy and Raenette Bumps  Prior Authorization for [employer] APPROVED PA# [id number] Valid: 01/25/24-01/23/25

## 2024-01-28 NOTE — Telephone Encounter (Signed)
 Medical Buy and Raenette Bumps  Patient is ready for scheduling on or after 01/25/24  Out-of-pocket cost due at time of visit: $356.87  Primary: [place]  Medicare Prolia co-insurance: 20% (approximately $331.87) Admin fee co-insurance: 20% (approximately $25)  Deductible: does not apply  Prior Auth: NOT required  Secondary: Aetna Medicare Supplemental POS II Prolia co-insurance: 30% after deductible has been met Admin fee co-insurance: 30% after deductible has been met  Deductible: $229.78 of $1500 met  Prior Auth: APPROVED PA# [id number] Valid: 01/25/24-01/23/25  ** This summary of benefits is an estimation of the patient's out-of-pocket cost. Exact cost may vary based on individual plan coverage.

## 2024-02-03 ENCOUNTER — Encounter: Payer: Self-pay | Admitting: Family Medicine

## 2024-02-03 ENCOUNTER — Telehealth: Payer: Self-pay

## 2024-02-03 ENCOUNTER — Telehealth: Payer: Self-pay | Admitting: "Endocrinology

## 2024-02-03 NOTE — Telephone Encounter (Signed)
 Error

## 2024-02-03 NOTE — Telephone Encounter (Signed)
 Spoke to pt  regarding prolia injection. Informed pt that per Dr Vertell Gory that it's ok for pt the taking the Prolia injection was new Dx of CDK and fatty liver.

## 2024-02-03 NOTE — Telephone Encounter (Signed)
 Telephone encounter sent to clinical staff questioning if patient should have Prolia due to certain health problems.

## 2024-02-03 NOTE — Telephone Encounter (Signed)
 I called patient to schedule Prolia injection and she states that she was recently diagnosed with chronic kidney disease and fatty liver disease.  Patient would like to know if she should still get the Prolia injection.

## 2024-02-18 ENCOUNTER — Emergency Department
Admission: EM | Admit: 2024-02-18 | Discharge: 2024-02-18 | Disposition: A | Discharge status: Home / Self Care | Attending: Emergency Medicine | Admitting: Emergency Medicine

## 2024-02-18 ENCOUNTER — Other Ambulatory Visit: Payer: Self-pay

## 2024-02-18 DIAGNOSIS — L600 Ingrowing nail: Secondary | ICD-10-CM | POA: Insufficient documentation

## 2024-02-18 DIAGNOSIS — N189 Chronic kidney disease, unspecified: Secondary | ICD-10-CM | POA: Diagnosis not present

## 2024-02-18 DIAGNOSIS — J45909 Unspecified asthma, uncomplicated: Secondary | ICD-10-CM | POA: Insufficient documentation

## 2024-02-18 MED ORDER — LIDOCAINE HCL (PF) 1 % IJ SOLN
10.0000 mL | Freq: Once | INTRAMUSCULAR | Status: DC
  Filled 2024-02-18: qty 10

## 2024-02-18 MED ORDER — LIDOCAINE HCL (PF) 1 % IJ SOLN: 10.0000 mL | Freq: Once | INTRAMUSCULAR | Status: DC

## 2024-02-18 NOTE — ED Provider Notes (Signed)
 [facility] Provider Note    Event Date/Time   First MD Initiated Contact with Patient 02/18/24 1605     (approximate)   History   Chief Complaint Toe Pain   HPI  Nichole [name] is a 83 y.o. female with past medical history of hyperlipidemia, migraines, asthma, CKD, and fibromyalgia who presents to the ED complaining of toe pain.  Patient reports that for the past 3 weeks she has been dealing with increasing pain and swelling along the medial portion of her right great toe.  She has been doing warm water soaks for the toe, noticed drainage of a small amount of pus the other day but denies any fevers.  She has not noticed any wounds to the foot.     Physical Exam   Triage Vital Signs: ED Triage Vitals  Encounter Vitals Group     BP 02/18/24 1555 134/81     Girls Systolic BP Percentile --      Girls Diastolic BP Percentile --      Boys Systolic BP Percentile --      Boys Diastolic BP Percentile --      Pulse Rate 02/18/24 1555 68     Resp 02/18/24 1555 16     Temp 02/18/24 1555 98.3 F (36.8 C)     Temp Source 02/18/24 1555 Oral     SpO2 02/18/24 1555 98 %     Weight 02/18/24 1553 161 lb 9.6 oz (73.3 kg)     Height --      Head Circumference --      Peak Flow --      Pain Score 02/18/24 1553 6     Pain Loc --      Pain Education --      Exclude from Growth Chart --     Most recent vital signs: Vitals:   02/18/24 1555  BP: 134/81  Pulse: 68  Resp: 16  Temp: 98.3 F (36.8 C)  SpO2: 98%    Constitutional: Alert and oriented. Eyes: Conjunctivae are normal. Head: Atraumatic. Nose: No congestion/rhinnorhea. Mouth/Throat: Mucous membranes are moist.  Cardiovascular: Normal rate, regular rhythm. Grossly normal heart sounds.  2+ radial pulses bilaterally. Respiratory: Normal respiratory effort.  No retractions. Lungs CTAB. Gastrointestinal: Soft and nontender. No distention. Musculoskeletal: No lower extremity tenderness nor edema.   Medial portion of right great toenail is ingrown without significant erythema, edema, or fluctuance. Neurologic:  Normal speech and language. No gross focal neurologic deficits are appreciated.    ED Results / Procedures / Treatments   Labs (all labs ordered are listed, but only abnormal results are displayed) Labs Reviewed - No data to display   PROCEDURES:  Critical Care performed: No  Excise ingrown toenail  Date/Time: 02/18/2024 4:55 PM  Performed by: Twilla Galea, MD Authorized by: Twilla Galea, MD  Consent: Verbal consent obtained Risks and benefits: risks, benefits and alternatives were discussed Consent given by: patient Local anesthesia used: yes Anesthesia: digital block  Anesthesia: Local anesthesia used: yes Local Anesthetic: lidocaine  1% without epinephrine  Anesthetic total: 6 mL  Sedation: Patient sedated: no  Patient tolerance: patient tolerated the procedure well with no immediate complications      MEDICATIONS ORDERED IN ED: Medications  lidocaine  (PF) (XYLOCAINE ) 1 % injection 10 mL (has no administration in time range)     IMPRESSION / MDM / ASSESSMENT AND PLAN / ED COURSE  I reviewed the triage vital signs and the nursing notes.  83 y.o. female with past medical history of hyperlipidemia, migraines, asthma, CKD, and fibromyalgia who presents to the ED complaining of pain and swelling to her right great toe for the past 3 weeks.  Patient's presentation is most consistent with acute, uncomplicated illness.  Differential diagnosis includes, but is not limited to, ingrown toenail, paronychia, felon.  Patient nontoxic-appearing and in no acute distress, vital signs are unremarkable.  No signs of infection to the toe but she does appear to have an ingrown toenail.  Her toe was anesthetized with a digital block, ingrown toenail was successfully excised with no purulence noted.  She is appropriate for outpatient  management, referral provided to podiatry, and she was counseled to continue warm water soaks.  She was counseled to return to the ED for new or worsening symptoms, patient agrees with plan.      FINAL CLINICAL IMPRESSION(S) / ED DIAGNOSES   Final diagnoses:  Ingrown toenail     Rx / DC Orders   ED Discharge Orders     None        Note:  This document was prepared using Dragon voice recognition software and may include unintentional dictation errors.   Twilla Galea, MD 02/18/24 [phone]

## 2024-02-18 NOTE — ED Triage Notes (Signed)
 Right toe ingrown toenail.

## 2024-02-25 NOTE — Telephone Encounter (Signed)
 Dartha Ernst, MD to Arloa Jeoffrey SAILOR, CMA     02/03/24 12:20 PM Yes she can get Prolia.  Thanks

## 2024-02-25 NOTE — Telephone Encounter (Signed)
 Prolia (denosumab) has several important contraindications to be aware of before starting treatment:  Hypocalcemia: Prolia is contraindicated in patients with low blood calcium  levels. This must be corrected before initiating therapy, as Prolia can worsen hypocalcemia, especially in those with kidney impairment.  Pregnancy: It should not be used during pregnancy due to potential harm to the fetus. Women of reproductive potential should undergo pregnancy testing before starting treatment.  Hypersensitivity: Anyone with a known hypersensitivity to denosumab or any component of Prolia should avoid it. Reactions may include anaphylaxis, facial swelling, or hives.  Concurrent use with Xgeva: Since Xgeva contains the same active ingredient (denosumab), patients should not receive both medications simultaneously.  If you're considering Prolia or have concerns about its safety, I can help you explore alternatives or dig into the risks and benefits based on your situation. Want to go deeper into how it works or what to expect during treatment?

## 2024-02-26 DIAGNOSIS — M79675 Pain in left toe(s): Secondary | ICD-10-CM | POA: Diagnosis not present

## 2024-02-26 DIAGNOSIS — M79674 Pain in right toe(s): Secondary | ICD-10-CM | POA: Diagnosis not present

## 2024-02-26 DIAGNOSIS — L03031 Cellulitis of right toe: Secondary | ICD-10-CM | POA: Diagnosis not present

## 2024-03-04 ENCOUNTER — Ambulatory Visit: Admitting: Family Medicine

## 2024-03-17 ENCOUNTER — Ambulatory Visit: Payer: Medicare Other

## 2024-03-17 VITALS — BP 110/68 | Ht 62.0 in | Wt 159.4 lb

## 2024-03-17 DIAGNOSIS — Z0000 Encounter for general adult medical examination without abnormal findings: Secondary | ICD-10-CM
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
## 2024-03-17 NOTE — Telephone Encounter (Signed)
 err

## 2024-03-17 NOTE — Patient Instructions (Addendum)
 Nichole [name] , Thank you for taking time out of your busy schedule to complete your Annual Wellness Visit with me. I enjoyed our conversation and look forward to speaking with you again next year. I, as well as your care team,  appreciate your ongoing commitment to your health goals. Please review the following plan we discussed and let me know if I can assist you in the future.   Follow up Visits: Next Medicare AWV with our clinical staff:   03/23/25 @ 11:30 AM IN PERSON Have you seen your provider in the last 6 months (3 months if uncontrolled diabetes)? Yes  Clinician Recommendations:  Aim for 30 minutes of exercise or brisk walking, 6-8 glasses of water, and 5 servings of fruits and vegetables each day. TAKE CARE!      This is a list of the screening recommended for you and due dates:  Health Maintenance  Topic Date Due   Zoster (Shingles) Vaccine (2 of 2) 04/22/2024*   COVID-19 Vaccine (4 - 2024-25 season) 05/04/2024*   Flu Shot  04/03/2024   Medicare Annual Wellness Visit  03/17/2025   DEXA scan (bone density measurement)  12/11/2025   DTaP/Tdap/Td vaccine (2 - Td or Tdap) 11/28/2026   Pneumococcal Vaccine for age over 63  Completed   Hepatitis B Vaccine  Aged Out   HPV Vaccine  Aged Out   Meningitis B Vaccine  Aged Out   Colon Cancer Screening  Discontinued   Hepatitis C Screening  Discontinued  *Topic was postponed. The date shown is not the original due date.    Advanced directives: (ACP Link)Information on Advanced Care Planning can be found at [place]  Secretary of [facility] Advance Health Care Directives Advance Health Care Directives. [email or web address]  Advance Care Planning is important because it:  [x]  Makes sure you receive the medical care that is consistent with your values, goals, and preferences  [x]  It provides guidance to your family and loved ones and reduces their decisional burden about whether or not they are making the right decisions based on your  wishes.  Follow the link provided in your after visit summary or read over the paperwork we have mailed to you to help you started getting your Advance Directives in place. If you need assistance in completing these, please reach out to us  so that we can help you!

## 2024-03-24 ENCOUNTER — Other Ambulatory Visit: Payer: Self-pay | Admitting: Family Medicine

## 2024-03-24 DIAGNOSIS — E039 Hypothyroidism, unspecified: Secondary | ICD-10-CM

## 2024-03-24 MED ORDER — LEVOTHYROXINE SODIUM 75 MCG PO TABS: ORAL_TABLET | ORAL | 1 refills | Status: AC

## 2024-03-24 MED ORDER — CLOPIDOGREL BISULFATE 75 MG PO TABS: 75.0000 mg | ORAL_TABLET | Freq: Every day | ORAL | 1 refills | Status: AC

## 2024-03-24 MED ORDER — LEVOTHYROXINE SODIUM 88 MCG PO TABS: ORAL_TABLET | ORAL | 1 refills | Status: AC

## 2024-03-24 MED ORDER — GABAPENTIN 600 MG PO TABS: 1200.0000 mg | ORAL_TABLET | Freq: Two times a day (BID) | ORAL | 1 refills | Status: AC

## 2024-03-25 ENCOUNTER — Telehealth: Payer: Self-pay

## 2024-03-25 NOTE — Telephone Encounter (Signed)
 Spoke with patient and made aware of recheck for TSH has been ordered. Patient verbalized understanding. During call patient stated she should have a CPAP, I see your note of 01/21/24 she was refer for sleep apnea on referral notes states of showing moderate sleep apnea and needs CPAP supplies. Would you like for me to go ahead and order supplies?

## 2024-03-27 NOTE — Telephone Encounter (Signed)
 Patient advised.

## 2024-04-04 NOTE — Telephone Encounter (Signed)
 F/U visit scheduled 06/17/24

## 2024-04-17 ENCOUNTER — Other Ambulatory Visit: Payer: Self-pay

## 2024-04-17 ENCOUNTER — Emergency Department: Payer: Self-pay

## 2024-04-17 ENCOUNTER — Emergency Department
Admission: EM | Admit: 2024-04-17 | Discharge: 2024-04-17 | Disposition: A | Discharge status: Home / Self Care | Payer: Self-pay

## 2024-04-17 DIAGNOSIS — M25511 Pain in right shoulder: Secondary | ICD-10-CM

## 2024-04-17 DIAGNOSIS — M778 Other enthesopathies, not elsewhere classified: Secondary | ICD-10-CM

## 2024-04-17 DIAGNOSIS — M19011 Primary osteoarthritis, right shoulder: Secondary | ICD-10-CM | POA: Diagnosis not present

## 2024-04-17 DIAGNOSIS — M7521 Bicipital tendinitis, right shoulder: Secondary | ICD-10-CM | POA: Diagnosis not present

## 2024-04-17 DIAGNOSIS — R079 Chest pain, unspecified: Secondary | ICD-10-CM | POA: Diagnosis not present

## 2024-04-17 LAB — BASIC METABOLIC PANEL WITH GFR
Anion gap: 8 (ref 5–15)
BUN: 15 mg/dL (ref 8–23)
CO2: 22 mmol/L (ref 22–32)
Calcium: 8.9 mg/dL (ref 8.9–10.3)
Chloride: 109 mmol/L (ref 98–111)
Creatinine, Ser: 0.77 mg/dL (ref 0.44–1.00)
GFR, Estimated: >60 mL/min (ref >60)
Glucose, Bld: 133 mg/dL — ABNORMAL HIGH (ref 70–99)
Potassium: 3.8 mmol/L (ref 3.5–5.1)
Sodium: 139 mmol/L (ref 135–145)

## 2024-04-17 LAB — CBC
HCT: 36.2 % (ref 36.0–46.0)
Hemoglobin: 12.6 g/dL (ref 12.0–15.0)
MCH: 31.7 pg (ref 26.0–34.0)
MCHC: 34.8 g/dL (ref 30.0–36.0)
MCV: 91 fL (ref 80.0–100.0)
Platelets: 224 K/uL (ref 150–400)
RBC: 3.98 MIL/uL (ref 3.87–5.11)
RDW: 12.4 % (ref 11.5–15.5)
WBC: 5.5 K/uL (ref 4.0–10.5)
nRBC: 0 % (ref 0.0–0.2)

## 2024-04-17 LAB — TROPONIN I (HIGH SENSITIVITY): Troponin I (High Sensitivity): 4 ng/L (ref <18)

## 2024-04-17 MED ORDER — METHOCARBAMOL 500 MG PO TABS: 500.0000 mg | ORAL_TABLET | Freq: Two times a day (BID) | ORAL | 0 refills | Status: AC | PRN

## 2024-04-17 MED ORDER — PREDNISONE 20 MG PO TABS: 20.0000 mg | ORAL_TABLET | Freq: Two times a day (BID) | ORAL | 0 refills | Status: AC

## 2024-04-17 NOTE — Discharge Instructions (Signed)
 Your exam and XR show no signs of any fracture or dislocation. Your symptoms are consistent with tendinitis. Follow-up with ortho as discussed. Take the prescription meds as directed.

## 2024-04-17 NOTE — ED Triage Notes (Signed)
 Pt arrives with c/o right shoulder pain that radiates into her right neck and jaw. Pt denies CP or SOB. Per pt, she woke up this morning with the pain and it has worsened throughout the day.

## 2024-04-17 NOTE — ED Notes (Signed)
 Pt given soda and fluffy pillow by [employer].

## 2024-04-18 NOTE — ED Provider Notes (Signed)
 [facility] Emergency Department Provider Note     Event Date/Time   First MD Initiated Contact with Patient 04/17/24 2008     (approximate)   History   Shoulder Pain   HPI  Nichole [name] is a 83 y.o. female with a noncontributory medical history, presents to the ED for evaluation of acute right shoulder pain.  For sudden onset of right pain that woke her up from sleep sleep this morning.  She has been endorsing worsening pain throughout the day.  She denies any preceding injury, trauma, or fall.  No history of chronic ongoing shoulder problems.  She denies any cervical radicular symptoms in the past.  Patient notes pain aggravated to the shoulder with extension above 90 degrees as well as some lifting with the bicep muscle.  No grip changes distally.  No frank chest pain or shortness of breath reported.  Physical Exam   Triage Vital Signs: ED Triage Vitals  Encounter Vitals Group     BP 04/17/24 1604 (!) 163/67     Girls Systolic BP Percentile --      Girls Diastolic BP Percentile --      Boys Systolic BP Percentile --      Boys Diastolic BP Percentile --      Pulse Rate 04/17/24 1604 75     Resp 04/17/24 1604 19     Temp 04/17/24 1604 97.8 F (36.6 C)     Temp Source 04/17/24 1604 Oral     SpO2 04/17/24 1604 99 %     Weight 04/17/24 1556 160 lb (72.6 kg)     Height 04/17/24 1556 5' 2 (1.575 m)     Head Circumference --      Peak Flow --      Pain Score 04/17/24 1556 9     Pain Loc --      Pain Education --      Exclude from Growth Chart --     Most recent vital signs: Vitals:   04/17/24 1604 04/17/24 2038  BP: (!) 163/67 (!) 181/82  Pulse: 75 62  Resp: 19 18  Temp: 97.8 F (36.6 C) 97.9 F (36.6 C)  SpO2: 99% 100%    General Awake, no distress. NAD HEENT NCAT. PERRL. EOMI. No rhinorrhea. Mucous membranes are moist.  CV:  Good peripheral perfusion.  RESP:  Normal effort.  MSK:  Normal spinal alignment without midline  tenderness, spasm, deformity, or step-off to the cervical and thoracic spine.  Mild tenderness to palpation over the right upper trapezius musculature.  Right shoulder without any obvious deformity, dislocation, or sulcus sign.  Patient with active range of motion limited beyond 90 degrees secondary to some pain.  Mild impingement noted on exam.  Patient with reproducible shoulder pain with internal rotation and abduction.  Normal composite fists distally.   ED Results / Procedures / Treatments   Labs (all labs ordered are listed, but only abnormal results are displayed) Labs Reviewed  BASIC METABOLIC PANEL WITH GFR - Abnormal; Notable for the following components:      Result Value   Glucose, Bld 133 (*)    All other components within normal limits  CBC  TROPONIN I (HIGH SENSITIVITY)     EKG  Vent. rate 60 BPM PR interval 204 ms QRS duration 74 ms QT/QTcB 432/432 ms P-R-T axes 80 85 67 Sinus rhythm with Premature atrial complexes Otherwise normal ECG No previous ECGs available  RADIOLOGY  I personally viewed and  evaluated these images as part of my medical decision making, as well as reviewing the written report by the radiologist.  ED Provider Interpretation: No acute findings  DG Shoulder Right Result Date: 04/17/2024 CLINICAL DATA:  Shoulder pain EXAM: RIGHT SHOULDER - 2+ VIEW COMPARISON:  None Available. FINDINGS: No fracture or malalignment. Mild AC joint degenerative change. Right apex is clear IMPRESSION: Mild AC joint degenerative change. Electronically Signed   By: Luke Bun M.D.   On: 04/17/2024 16:59   DG Chest 2 View Result Date: 04/17/2024 CLINICAL DATA:  Chest pain. EXAM: CHEST - 2 VIEW COMPARISON:  11/18/2015. FINDINGS: Bilateral lung fields are clear. Bilateral costophrenic angles are clear. Normal cardio-mediastinal silhouette. No acute osseous abnormalities. The soft tissues are within normal limits. IMPRESSION: No active cardiopulmonary disease.  Electronically Signed   By: Ree Molt M.D.   On: 04/17/2024 16:43     PROCEDURES:  Critical Care performed: No  Procedures   MEDICATIONS ORDERED IN ED: Medications - No data to display   IMPRESSION / MDM / ASSESSMENT AND PLAN / ED COURSE  I reviewed the triage vital signs and the nursing notes.                              Differential diagnosis includes, but is not limited to, sprain, tendinitis, bursitis, OA  Patient's presentation is most consistent with acute complicated illness / injury requiring diagnostic workup.  Patient's diagnosis is consistent with acute shoulder pain on the right, likely secondary to shoulder tendinitis.  Patient presents with nontraumatic right shoulder pain on awakening this morning.  No provocative for aggravating measures prior to onset.  Exam overall reassuring without evidence of frank rotator cuff tear.  She does have some mild impingement on exam.  Patient's workup is overall reassuring including negative plain films of the shoulder and chest, interpreted by me.  Patient will be discharged home with prescriptions for methocarbamol  and prednisone . Patient is to follow up with [facility] as suggested, as needed or otherwise directed. Patient is given ED precautions to return to the ED for any worsening or new symptoms.   FINAL CLINICAL IMPRESSION(S) / ED DIAGNOSES   Final diagnoses:  Acute pain of right shoulder  Shoulder tendinitis, right     Rx / DC Orders   ED Discharge Orders          Ordered    methocarbamol  (ROBAXIN ) 500 MG tablet  2 times daily PRN        04/17/24 2104    predniSONE  (DELTASONE ) 20 MG tablet  2 times daily with meals        04/17/24 2104             Note:  This document was prepared using Dragon voice recognition software and may include unintentional dictation errors.    Loyd Candida LULLA Aldona, PA-C 04/18/24 0057    Nicholaus Rolland BRAVO, MD 04/18/24 [phone]

## 2024-04-21 ENCOUNTER — Telehealth: Payer: Self-pay

## 2024-04-21 NOTE — Telephone Encounter (Signed)
 Pt need PA done for prolia. Thanks!

## 2024-04-23 DIAGNOSIS — M25511 Pain in right shoulder: Secondary | ICD-10-CM | POA: Diagnosis not present

## 2024-04-24 DIAGNOSIS — Z23 Encounter for immunization: Secondary | ICD-10-CM | POA: Diagnosis not present

## 2024-05-04 NOTE — Telephone Encounter (Signed)
 VOB re-submitted for more accurate deductible information.

## 2024-05-04 NOTE — Telephone Encounter (Signed)
 Nichole [name], [place]    04/21/24  4:58 PM Note Pt need PA done for prolia. Thanks!

## 2024-05-08 DIAGNOSIS — M79642 Pain in left hand: Secondary | ICD-10-CM | POA: Diagnosis not present

## 2024-05-08 DIAGNOSIS — S6392XA Sprain of unspecified part of left wrist and hand, initial encounter: Secondary | ICD-10-CM | POA: Diagnosis not present

## 2024-05-09 NOTE — Telephone Encounter (Signed)
 Medical Buy and Bill   Prior Authorization REQUIRED for [employer] Secondary: [employer]

## 2024-05-11 DIAGNOSIS — M75100 Unspecified rotator cuff tear or rupture of unspecified shoulder, not specified as traumatic: Secondary | ICD-10-CM | POA: Diagnosis not present

## 2024-05-11 DIAGNOSIS — M19011 Primary osteoarthritis, right shoulder: Secondary | ICD-10-CM | POA: Diagnosis not present

## 2024-05-11 DIAGNOSIS — M25511 Pain in right shoulder: Secondary | ICD-10-CM | POA: Diagnosis not present

## 2024-05-13 NOTE — Telephone Encounter (Signed)
 [facility] [employer] [facility] [employer]   Patient is ready for scheduling on or after 01/25/24   Out-of-pocket cost due at time of visit: $94    Primary: [place]  Medicare [place] co-insurance: 0% Admin fee co-insurance: 0%   Deductible: does not apply   Prior Auth: NOT required   Secondary: Aetna Medicare Supplemental POS II [place] co-insurance:  plan will coordinate benefits and  consider the Part B deductible and coinsurance at 100%, after the patient satisfies a $94 copay. Admin fee co-insurance:  plan will coordinate benefits and  consider the Part B deductible and coinsurance at 100%, after the patient satisfies a $94 copay.   Deductible: does not apply   Prior Auth: APPROVED PA# [id number] Valid: 01/25/24-01/23/25   ** This summary of benefits is an estimation of the patient's out-of-pocket cost. Exact cost may vary based on individual plan coverage.

## 2024-05-19 DIAGNOSIS — M75111 Incomplete rotator cuff tear or rupture of right shoulder, not specified as traumatic: Secondary | ICD-10-CM | POA: Diagnosis not present

## 2024-05-19 DIAGNOSIS — M25511 Pain in right shoulder: Secondary | ICD-10-CM | POA: Diagnosis not present

## 2024-05-20 DIAGNOSIS — S66912A Strain of unspecified muscle, fascia and tendon at wrist and hand level, left hand, initial encounter: Secondary | ICD-10-CM | POA: Diagnosis not present

## 2024-05-20 DIAGNOSIS — S63502A Unspecified sprain of left wrist, initial encounter: Secondary | ICD-10-CM | POA: Diagnosis not present

## 2024-05-23 ENCOUNTER — Other Ambulatory Visit: Payer: Self-pay | Admitting: Family Medicine

## 2024-05-23 DIAGNOSIS — K219 Gastro-esophageal reflux disease without esophagitis: Secondary | ICD-10-CM

## 2024-05-28 DIAGNOSIS — M79671 Pain in right foot: Secondary | ICD-10-CM | POA: Diagnosis not present

## 2024-05-28 DIAGNOSIS — G5791 Unspecified mononeuropathy of right lower limb: Secondary | ICD-10-CM | POA: Diagnosis not present

## 2024-05-28 DIAGNOSIS — M25571 Pain in right ankle and joints of right foot: Secondary | ICD-10-CM | POA: Diagnosis not present

## 2024-06-01 ENCOUNTER — Telehealth: Payer: Self-pay | Admitting: Family Medicine

## 2024-06-01 NOTE — Telephone Encounter (Signed)
 Copied from CRM [id number]. Topic: General - Call Back - No Documentation >> Jun 01, 2024  9:40 AM Avram MATSU wrote: Reason for CRM: Pt is calling abut her CPAP machine, stated she was approved for one but has not heard anything back. Please advise [phone] (M)

## 2024-06-02 ENCOUNTER — Other Ambulatory Visit: Payer: Self-pay

## 2024-06-02 ENCOUNTER — Ambulatory Visit (INDEPENDENT_AMBULATORY_CARE_PROVIDER_SITE_OTHER): Admitting: Psychiatry

## 2024-06-02 ENCOUNTER — Encounter: Payer: Self-pay | Admitting: Psychiatry

## 2024-06-02 VITALS — BP 132/73 | HR 65 | Temp 97.3°F | Ht 62.0 in | Wt 162.4 lb

## 2024-06-02 DIAGNOSIS — Z636 Dependent relative needing care at home: Secondary | ICD-10-CM | POA: Diagnosis not present

## 2024-06-02 DIAGNOSIS — R41840 Attention and concentration deficit: Secondary | ICD-10-CM | POA: Diagnosis not present

## 2024-06-02 DIAGNOSIS — F331 Major depressive disorder, recurrent, moderate: Secondary | ICD-10-CM

## 2024-06-02 DIAGNOSIS — F411 Generalized anxiety disorder: Secondary | ICD-10-CM

## 2024-06-02 MED ORDER — BUPROPION HCL ER (XL) 150 MG PO TB24: 150.0000 mg | ORAL_TABLET | Freq: Every day | ORAL | 0 refills | Status: DC
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
## 2024-06-05 NOTE — Telephone Encounter (Signed)
 Spoke with patient and apologized about how long this has taken. I have placed and faxed documentation to [employer] [facility].

## 2024-06-05 NOTE — Telephone Encounter (Signed)
Currently working on this.

## 2024-06-13 ENCOUNTER — Emergency Department

## 2024-06-13 ENCOUNTER — Emergency Department
Admission: EM | Admit: 2024-06-13 | Discharge: 2024-06-13 | Disposition: A | Discharge status: Home / Self Care | Attending: Emergency Medicine | Admitting: Emergency Medicine

## 2024-06-13 ENCOUNTER — Encounter: Payer: Self-pay | Admitting: Emergency Medicine

## 2024-06-13 ENCOUNTER — Other Ambulatory Visit: Payer: Self-pay

## 2024-06-13 DIAGNOSIS — N23 Unspecified renal colic: Secondary | ICD-10-CM | POA: Diagnosis not present

## 2024-06-13 DIAGNOSIS — K449 Diaphragmatic hernia without obstruction or gangrene: Secondary | ICD-10-CM | POA: Diagnosis not present

## 2024-06-13 DIAGNOSIS — N132 Hydronephrosis with renal and ureteral calculous obstruction: Secondary | ICD-10-CM | POA: Insufficient documentation

## 2024-06-13 DIAGNOSIS — I10 Essential (primary) hypertension: Secondary | ICD-10-CM | POA: Diagnosis not present

## 2024-06-13 DIAGNOSIS — K6389 Other specified diseases of intestine: Secondary | ICD-10-CM | POA: Insufficient documentation

## 2024-06-13 DIAGNOSIS — J45909 Unspecified asthma, uncomplicated: Secondary | ICD-10-CM | POA: Diagnosis not present

## 2024-06-13 DIAGNOSIS — N200 Calculus of kidney: Secondary | ICD-10-CM | POA: Diagnosis not present

## 2024-06-13 DIAGNOSIS — R1084 Generalized abdominal pain: Secondary | ICD-10-CM | POA: Diagnosis not present

## 2024-06-13 DIAGNOSIS — R109 Unspecified abdominal pain: Secondary | ICD-10-CM | POA: Diagnosis not present

## 2024-06-13 LAB — CBC WITH DIFFERENTIAL/PLATELET
Abs Immature Granulocytes: 0.03 K/uL (ref 0.00–0.07)
Basophils Absolute: 0 K/uL (ref 0.0–0.1)
Basophils Relative: 0 %
Eosinophils Absolute: 0.1 K/uL (ref 0.0–0.5)
Eosinophils Relative: 2 %
HCT: 38.2 % (ref 36.0–46.0)
Hemoglobin: 12.9 g/dL (ref 12.0–15.0)
Immature Granulocytes: 0 %
Lymphocytes Relative: 23 %
Lymphs Abs: 1.6 K/uL (ref 0.7–4.0)
MCH: 31.9 pg (ref 26.0–34.0)
MCHC: 33.8 g/dL (ref 30.0–36.0)
MCV: 94.6 fL (ref 80.0–100.0)
Monocytes Absolute: 0.5 K/uL (ref 0.1–1.0)
Monocytes Relative: 7 %
Neutro Abs: 4.9 K/uL (ref 1.7–7.7)
Neutrophils Relative %: 68 %
Platelets: 218 K/uL (ref 150–400)
RBC: 4.04 MIL/uL (ref 3.87–5.11)
RDW: 13.6 % (ref 11.5–15.5)
WBC: 7.2 K/uL (ref 4.0–10.5)
nRBC: 0 % (ref 0.0–0.2)

## 2024-06-13 LAB — URINALYSIS, W/ REFLEX TO CULTURE (INFECTION SUSPECTED)
Bilirubin Urine: NEGATIVE
Glucose, UA: NEGATIVE mg/dL
Ketones, ur: NEGATIVE mg/dL
Leukocytes,Ua: NEGATIVE
Nitrite: NEGATIVE
Protein, ur: NEGATIVE mg/dL
Specific Gravity, Urine: 1.016 (ref 1.005–1.030)
pH: 8 (ref 5.0–8.0)

## 2024-06-13 LAB — COMPREHENSIVE METABOLIC PANEL WITH GFR
ALT: 21 U/L (ref 0–44)
AST: 19 U/L (ref 15–41)
Albumin: 3.9 g/dL (ref 3.5–5.0)
Alkaline Phosphatase: 73 U/L (ref 38–126)
Anion gap: 13 (ref 5–15)
BUN: 16 mg/dL (ref 8–23)
CO2: 27 mmol/L (ref 22–32)
Calcium: 8.7 mg/dL — ABNORMAL LOW (ref 8.9–10.3)
Chloride: 99 mmol/L (ref 98–111)
Creatinine, Ser: 0.87 mg/dL (ref 0.44–1.00)
GFR, Estimated: >60 mL/min (ref >60)
Glucose, Bld: 123 mg/dL — ABNORMAL HIGH (ref 70–99)
Potassium: 3.6 mmol/L (ref 3.5–5.1)
Sodium: 139 mmol/L (ref 135–145)
Total Bilirubin: 0.5 mg/dL (ref 0.0–1.2)
Total Protein: 6.4 g/dL — ABNORMAL LOW (ref 6.5–8.1)

## 2024-06-13 LAB — LIPASE, BLOOD: Lipase: 43 U/L (ref 11–51)

## 2024-06-13 MED ORDER — KETOROLAC TROMETHAMINE 15 MG/ML IJ SOLN
15.0000 mg | Freq: Once | INTRAMUSCULAR | Status: AC
  Administered 2024-06-13: 15 mg via INTRAVENOUS
  Filled 2024-06-13: qty 1

## 2024-06-13 MED ORDER — IOHEXOL 300 MG/ML  SOLN
100.0000 mL | Freq: Once | INTRAMUSCULAR | Status: AC | PRN
  Administered 2024-06-13: 100 mL via INTRAVENOUS

## 2024-06-13 MED ORDER — SODIUM CHLORIDE 0.9 % IV BOLUS
1000.0000 mL | Freq: Once | INTRAVENOUS | Status: AC
  Administered 2024-06-13: 1000 mL via INTRAVENOUS

## 2024-06-13 MED ORDER — HYDROCODONE-ACETAMINOPHEN 5-325 MG PO TABS: 1.0000 | ORAL_TABLET | Freq: Four times a day (QID) | ORAL | 0 refills | Status: AC | PRN

## 2024-06-13 MED ORDER — ONDANSETRON HCL 4 MG PO TABS: 4.0000 mg | ORAL_TABLET | Freq: Four times a day (QID) | ORAL | 0 refills | Status: AC | PRN

## 2024-06-13 MED ORDER — MORPHINE SULFATE (PF) 2 MG/ML IV SOLN
2.0000 mg | Freq: Once | INTRAVENOUS | Status: AC
  Administered 2024-06-13: 2 mg via INTRAVENOUS
  Filled 2024-06-13: qty 1

## 2024-06-13 MED ORDER — ONDANSETRON HCL 4 MG/2ML IJ SOLN
4.0000 mg | Freq: Once | INTRAMUSCULAR | Status: AC
  Administered 2024-06-13: 4 mg via INTRAVENOUS
  Filled 2024-06-13: qty 2

## 2024-06-13 MED ORDER — TAMSULOSIN HCL 0.4 MG PO CAPS: 0.4000 mg | ORAL_CAPSULE | Freq: Every day | ORAL | 0 refills | Status: AC

## 2024-06-13 NOTE — ED Provider Notes (Signed)
 [facility] Provider Note    Event Date/Time   First MD Initiated Contact with Patient 06/13/24 0745     (approximate)   History   Abdominal Pain   HPI  Nichole [name] is a 83 year old female with history of asthma, fibromyalgia presenting to the emergency department for evaluation of abdominal pain.  Patient reports that around 4 this morning she had onset of left-sided abdominal pain.  Does report she has had nausea with 2 episodes of vomiting.  No diarrhea.  Last BM yesterday.  No history of abdominal surgeries.  Does report that she has not been able to urinate since last night that she feels like she needs to.  No dysuria.    Physical Exam   Triage Vital Signs: ED Triage Vitals [06/13/24 0748]  Encounter Vitals Group     BP (!) 165/80     Girls Systolic BP Percentile      Girls Diastolic BP Percentile      Boys Systolic BP Percentile      Boys Diastolic BP Percentile      Pulse Rate 60     Resp 18     Temp 97.8 F (36.6 C)     Temp Source Oral     SpO2 100 %     Weight 160 lb (72.6 kg)     Height 5' 2 (1.575 m)     Head Circumference      Peak Flow      Pain Score 10     Pain Loc      Pain Education      Exclude from Growth Chart     Most recent vital signs: Vitals:   06/13/24 0930 06/13/24 1000  BP: (!) 168/89 (!) 156/80  Pulse: 70 65  Resp: 20 18  Temp:    SpO2: 100% 98%     General: Awake, interactive  CV:  Good peripheral perfusion Resp:  Unlabored respirations Abd:  Mild fullness, tender to palpation throughout the left side of the abdomen without rebound or guarding Neuro:  Symmetric facial movement, fluid speech Other:   Bladder decompressed on bedside ultrasound and bladder scan   ED Results / Procedures / Treatments   Labs (all labs ordered are listed, but only abnormal results are displayed) Labs Reviewed  COMPREHENSIVE METABOLIC PANEL WITH GFR - Abnormal; Notable for the following components:      Result  Value   Glucose, Bld 123 (*)    Calcium  8.7 (*)    Total Protein 6.4 (*)    All other components within normal limits  URINALYSIS, W/ REFLEX TO CULTURE (INFECTION SUSPECTED) - Abnormal; Notable for the following components:   Color, Urine STRAW (*)    APPearance CLEAR (*)    Hgb urine dipstick MODERATE (*)    Bacteria, UA RARE (*)    All other components within normal limits  URINE CULTURE  CBC WITH DIFFERENTIAL/PLATELET  LIPASE, BLOOD     EKG EKG independently reviewed and interpreted by myself demonstrates:    RADIOLOGY Imaging independently reviewed and interpreted by myself demonstrates:  CT demonstrates 5 mm obstructing renal stone at the UVJ with associated hydronephrosis, questionable small bowel polyp noted  Formal Radiology Read:  CT ABDOMEN PELVIS W CONTRAST Result Date: 06/13/2024 CLINICAL DATA:  Left-sided abdominal pain radiating to the front associated with inability to urinate EXAM: CT ABDOMEN AND PELVIS WITH CONTRAST TECHNIQUE: Multidetector CT imaging of the abdomen and pelvis was performed using the standard  protocol following bolus administration of intravenous contrast. RADIATION DOSE REDUCTION: This exam was performed according to the departmental dose-optimization program which includes automated exposure control, adjustment of the mA and/or kV according to patient size and/or use of iterative reconstruction technique. CONTRAST:  OMNIPAQUE  IOHEXOL  300 MG/ML  SOLN COMPARISON:  None Available. FINDINGS: Lower chest: No focal consolidation or pulmonary nodule in the lung bases. Trace right pleural effusion. Partially imaged heart size is normal. Coronary artery calcifications. Hepatobiliary: Subcentimeter hypodensity in hepatic segment 2/3 (2:17), too small to characterize. No intra or extrahepatic biliary ductal dilation. Normal gallbladder. Pancreas: No focal lesions or main ductal dilation. Spleen: Normal in size without focal abnormality. Adrenals/Urinary  Tract: No adrenal nodules. Mild left hydroureteronephrosis to the level of an obstructing 5 mm stone at the ureterovesical junction. Subcentimeter hypodensities in the left kidney, too small to characterize. Additional punctate nonobstructing left renal stones. No right hydronephrosis. No focal bladder wall thickening. Stomach/Bowel: Small hiatal hernia. Normal appearance of the stomach. No evidence of bowel wall thickening, distention, or inflammatory changes. Rounded mildly hyperattenuating focus within a loop of medial left lower quadrant small bowel measures 1.2 x 0.7 cm (2:53). Cecum is located in the right upper quadrant. Appendix is not discretely seen. Vascular/Lymphatic: Aortic atherosclerosis. No enlarged abdominal or pelvic lymph nodes. Reproductive: No adnexal masses. Other: No free fluid, fluid collection, or free air. Musculoskeletal: No acute or abnormal lytic or blastic osseous lesions. Multilevel degenerative changes of the partially imaged thoracic and lumbar spine. IMPRESSION: 1. Mild left hydroureteronephrosis to the level of an obstructing 5 mm stone at the ureterovesical junction. Additional punctate nonobstructing left renal stones. 2. Rounded mildly hyperattenuating focus within a loop of medial left lower quadrant small bowel measures 1.2 x 0.7 cm. This is nonspecific and may represent ingested material, however a small bowel polyp is not excluded. Consider further evaluation with nonemergent CT enterography. 3. Trace right pleural effusion. 4. Aortic Atherosclerosis (ICD10-I70.0). Electronically Signed   By: Limin  Xu M.D.   On: 06/13/2024 09:45    PROCEDURES:  Critical Care performed: No  Procedures   MEDICATIONS ORDERED IN ED: Medications  ondansetron  (ZOFRAN ) injection 4 mg (4 mg Intravenous Given 06/13/24 0801)  morphine  (PF) 2 MG/ML injection 2 mg (2 mg Intravenous Given 06/13/24 0801)  sodium chloride  0.9 % bolus 1,000 mL (0 mLs Intravenous Stopped 06/13/24 0938)   morphine  (PF) 2 MG/ML injection 2 mg (2 mg Intravenous Given 06/13/24 0833)  iohexol  (OMNIPAQUE ) 300 MG/ML solution 100 mL (100 mLs Intravenous Contrast Given 06/13/24 0910)  ketorolac (TORADOL) 15 MG/ML injection 15 mg (15 mg Intravenous Given 06/13/24 1014)     IMPRESSION / MDM / ASSESSMENT AND PLAN / ED COURSE  I reviewed the triage vital signs and the nursing notes.  Differential diagnosis includes, but is not limited to, diverticulitis, colitis, UTI, urinary retention, pyelonephritis, renal stone, other acute intra-abdominal process  Patient's presentation is most consistent with acute presentation with potential threat to life or bodily function.  83 year old female presenting to the emergency department for evaluation of abdominal pain and urinary symptoms.  Stable vitals on presentation.  Subjective sensation of retention, but bladder decompressed on bedside ultrasound.  Will obtain labs, CT to further evaluate.  Ordered for symptomatic treatment.  CBC and CMP are overall reassuring.  Urinalysis with hematuria, lower suspicion for superimposed infection but will send culture.  Clinical presentation does not reflect an acute infected stone.  Patient treated symptomatically with significant improvement in her symptoms.  She  is comfortable with discharge home.  Discussed supportive care measures.  Will DC with prescription for Norco, Zofran , Flomax.  Given information for follow-up with urology.  Strict return precautions provided.  Patient discharged in stable condition.      FINAL CLINICAL IMPRESSION(S) / ED DIAGNOSES   Final diagnoses:  Ureteral colic  Kidney stone  Small bowel polyp     Rx / DC Orders   ED Discharge Orders          Ordered    ondansetron  (ZOFRAN ) 4 MG tablet  Every 6 hours PRN        06/13/24 1046    HYDROcodone-acetaminophen  (NORCO/VICODIN) 5-325 MG tablet  Every 6 hours PRN        06/13/24 1046    tamsulosin (FLOMAX) 0.4 MG CAPS capsule  Daily         06/13/24 1046             Note:  This document was prepared using Dragon voice recognition software and may include unintentional dictation errors.   Levander Slate, MD 06/13/24 1052

## 2024-06-13 NOTE — Discharge Instructions (Addendum)
 You were seen in the emergency department today for evaluation of your flank pain.  Your CT scan did show that you have a kidney stone that I suspect is the likely cause of your pain.  Your urine fortunately did not look infected.  You can take Tylenol  650 mg every 6 hours and ibuprofen 600 mg every 6 hours to help with your pain.  If you have breakthrough pain, I have sent a short course of narcotic pain medicine to your pharmacy.  This can make you drowsy, so do not drive or operate machinery when taking this.  In addition, I have sent a nausea medicine as well as a medicine that can help pass your stone called Flomax. Follow-up with urology in the next 1 to 2 weeks for reevaluation.  Return to the ER for new or worsening symptoms including uncontrolled pain despite pain medication, inability to tolerate food or liquids, fevers, or any other new or concerning symptoms.  Follow-up with your primary care doctor regarding the small bowel polyp that was noted on your CT today.  They may recommend additional imaging.

## 2024-06-13 NOTE — ED Triage Notes (Signed)
 Pt arrives via EMS from home with abd pain starting this morning and unable to urinate since yesterday evening. States the pain starts on left side and radiates to front and down to bladder. Pt reports this happened last week but was able to urinate.

## 2024-06-14 LAB — URINE CULTURE: Culture: <10000 — AB

## 2024-06-17 ENCOUNTER — Ambulatory Visit: Admitting: "Endocrinology

## 2024-06-22 ENCOUNTER — Ambulatory Visit: Admitting: Family Medicine

## 2024-06-22 ENCOUNTER — Encounter: Payer: Self-pay | Admitting: Family Medicine

## 2024-06-22 VITALS — BP 119/71 | HR 71 | Temp 98.6°F | Ht 62.0 in | Wt 163.2 lb

## 2024-06-22 DIAGNOSIS — K219 Gastro-esophageal reflux disease without esophagitis: Secondary | ICD-10-CM

## 2024-06-22 DIAGNOSIS — D649 Anemia, unspecified: Secondary | ICD-10-CM | POA: Diagnosis not present

## 2024-06-22 DIAGNOSIS — M797 Fibromyalgia: Secondary | ICD-10-CM | POA: Diagnosis not present

## 2024-06-22 DIAGNOSIS — F4310 Post-traumatic stress disorder, unspecified: Secondary | ICD-10-CM | POA: Diagnosis not present

## 2024-06-22 DIAGNOSIS — M810 Age-related osteoporosis without current pathological fracture: Secondary | ICD-10-CM

## 2024-06-22 DIAGNOSIS — N182 Chronic kidney disease, stage 2 (mild): Secondary | ICD-10-CM | POA: Diagnosis not present

## 2024-06-22 DIAGNOSIS — Z79899 Other long term (current) drug therapy: Secondary | ICD-10-CM

## 2024-06-22 DIAGNOSIS — S93401D Sprain of unspecified ligament of right ankle, subsequent encounter: Secondary | ICD-10-CM | POA: Diagnosis not present

## 2024-06-22 DIAGNOSIS — E039 Hypothyroidism, unspecified: Secondary | ICD-10-CM | POA: Diagnosis not present

## 2024-06-22 DIAGNOSIS — S46001D Unspecified injury of muscle(s) and tendon(s) of the rotator cuff of right shoulder, subsequent encounter: Secondary | ICD-10-CM

## 2024-06-22 DIAGNOSIS — N200 Calculus of kidney: Secondary | ICD-10-CM

## 2024-06-22 DIAGNOSIS — E7800 Pure hypercholesterolemia, unspecified: Secondary | ICD-10-CM | POA: Diagnosis not present

## 2024-06-22 NOTE — Progress Notes (Signed)
 Established patient visit   Patient: Nichole [name]   DOB: Apr 18, 1941   83 y.o. Female  MRN: [id number] Visit Date: 06/22/2024  Today's healthcare provider: LAURAINE LOISE BUOY, DO   Chief Complaint  Patient presents with   Follow-up    Patient is here for a 5 month follow up with provider.   Subjective    HPI Nichole [name] is an 83 year old female who presents with multiple medical concerns including a recent sprained ankle, kidney stones, and a torn rotator cuff.  She recently sprained her right ankle while assisting her daughter with her wheelchair. Despite receiving a steroid shot, the ankle remains painful and affects her balance, limiting her ability to exercise.  She is currently wearing an ankle brace which provides some benefit.  She has been experiencing kidney stones, which she describes as more painful than childbirth. She had an emergency room visit for the kidney stones and is currently taking Flomax to help pass them. She has never had kidney stones before this episode.  She reports a torn rotator cuff in her right shoulder, which she initially suspected to be a heart attack due to chest pain.  She reports that the injury has caused nerve damage in her right arm, resulting in a tremor and muscle cramps.  She recently started taking bupropion, a new psychiatric medication prescribed by her behavioral health provider, but has not yet noticed its effects. She is also taking atorvastatin  for cholesterol management and pantoprazole  for gastrointestinal issues.  She takes 1600 mg of calcium  daily for osteoporosis, which she reports she started within the last several months, after being instructed to by her endocrinologist. She is experiencing difficulties with her insurance, particularly regarding coverage for Prolia and Zepbound , both of which she reports have had very high co-pays.  She has not yet received her second shingles vaccine and has had multiple steroid shots  for various issues. No recent fever or new symptoms aside from those related to her current medical issues.      Medications: Outpatient Medications Prior to Visit  Medication Sig Note   albuterol  (VENTOLIN  HFA) 108 (90 Base) MCG/ACT inhaler Inhale 2 puffs into the lungs as needed for shortness of breath or wheezing (Maybe 2 months in the spring.).    atorvastatin  (LIPITOR) 40 MG tablet Take 1 tablet (40 mg total) by mouth daily.    buPROPion (WELLBUTRIN XL) 150 MG 24 hr tablet Take 1 tablet (150 mg total) by mouth daily.    busPIRone (BUSPAR) 15 MG tablet Take 30 mg by mouth. 30 mg in the morning and 15 mg in the evening    CALCIUM  PO Take 400 mg by mouth in the morning and at bedtime.    Cholecalciferol 50 MCG (2000 UT) CAPS Take by mouth as needed.    clopidogrel  (PLAVIX ) 75 MG tablet Take 1 tablet (75 mg total) by mouth daily.    doxylamine, Sleep, (UNISOM) 25 MG tablet Take 25 mg by mouth at bedtime.    DULoxetine  (CYMBALTA ) 60 MG capsule TAKE 1 CAPSULE BY MOUTH EVERY DAY    gabapentin  (NEURONTIN ) 600 MG tablet Take 2 tablets (1,200 mg total) by mouth 2 (two) times daily.    levothyroxine  (SYNTHROID ) 75 MCG tablet TAKE ONE TABLET THREE DAYS PER WEEK (alternating with synthroid   88 mcg)    levothyroxine  (SYNTHROID ) 88 MCG tablet TAKE ONE TABLET ON AN EMPTY STOMACH FOUR DAYS PER WEEK (alternating with synthroid  75 mcg)  methocarbamol (ROBAXIN) 500 MG tablet Take 1 tablet (500 mg total) by mouth 2 (two) times daily as needed for muscle spasms.    oxyCODONE (OXY IR/ROXICODONE) 5 MG immediate release tablet Take 5 mg by mouth every 4 (four) hours as needed for severe pain.    pantoprazole  (PROTONIX ) 40 MG tablet TAKE 1 TABLET (40 MG TOTAL) BY MOUTH TWICE A DAY BEFORE MEALS    polyethylene glycol powder (GLYCOLAX /MIRALAX ) powder Take 255 g by mouth once.    tamsulosin (FLOMAX) 0.4 MG CAPS capsule Take 1 capsule (0.4 mg total) by mouth daily for 14 days.    [DISCONTINUED] budesonide  (PULMICORT FLEXHALER) 180 MCG/ACT inhaler Inhale 2 puffs into the lungs daily. (Patient not taking: Reported on 03/17/2024)    [DISCONTINUED] tirzepatide  (ZEPBOUND ) 2.5 MG/0.5ML injection vial Inject 2.5 mg into the skin once a week. (Patient not taking: Reported on 03/17/2024) 06/22/2024: Can't afford   No facility-administered medications prior to visit.        Objective    BP 119/71 (BP Location: Left Arm, Patient Position: Sitting, Cuff Size: Normal)   Pulse 71   Temp 98.6 F (37 C) (Oral)   Ht 5' 2 (1.575 m)   Wt 163 lb 3.2 oz (74 kg)   SpO2 97%   BMI 29.85 kg/m     Physical Exam Vitals and nursing note reviewed.  Constitutional:      General: She is not in acute distress.    Appearance: Normal appearance.  HENT:     Head: Normocephalic and atraumatic.  Eyes:     General: No scleral icterus.    Conjunctiva/sclera: Conjunctivae normal.  Cardiovascular:     Rate and Rhythm: Normal rate.  Pulmonary:     Effort: Pulmonary effort is normal.  Musculoskeletal:     Comments: Brace on right ankle; sling on right arm  Neurological:     Mental Status: She is alert and oriented to person, place, and time. Mental status is at baseline.  Psychiatric:        Mood and Affect: Mood normal.        Behavior: Behavior normal.      No results found for any visits on 06/22/24.  Assessment & Plan    Acquired hypothyroidism -     Thyroid  Panel With TSH  Gastroesophageal reflux disease, unspecified whether esophagitis present -     Vitamin B12  High risk medication use -     Vitamin B12  Chronic kidney disease, stage 2, mildly decreased GFR -     Comprehensive metabolic panel with GFR  Normocytic anemia -     CBC with Differential/Platelet  Age-related osteoporosis without current pathological fracture Assessment & Plan: Started taking calcium  400 mg - two tablets twice daily. However, has been having recurrent kidney stones. Recommend decreasing calcium  to one tablet  twice daily and continue to monitor,  Following with endocrinology; defer to specialist management. - continue supplemental calcium  (at reduced dose) and vitamin D .  Orders: -     AMB Referral [facility] Care Management  Nephrolithiasis  Hypercholesteremia  PTSD (post-traumatic stress disorder)  Fibromyalgia  Injury of right rotator cuff, subsequent encounter  Sprain of right ankle, unspecified ligament, subsequent encounter     Nephrolithiasis New onset within the past month.  Ongoing passage of kidney stones causing discomfort. No prior history of nephrolithiasis. On Flomax.  Given recent initiation of high-dose calcium , suspect excess supplemental calcium  is causing her kidney stones. - Continue Flomax as prescribed. - Recommend reduction  of calcium  to one tablet (400 mg) twice daily, with subsequent reassessment.  Injury of right rotator cuff, subsequent encounter Torn rotator cuff with nerve damage causing tremor and cramps. Cardiac event ruled out. - Following with orthopedics; defer to specialist management.    Sprain of right ankle, unspecified ligament, subsequent encounter Sprained right ankle with persistent pain and balance issues. Received steroid injection. - Following with orthopedics; defer to specialist management.     Age-related osteoporosis without current pathological fracture Osteoporosis with T-score of the lumbar spine of -3.2 on 12/12/2023.  Management complicated by insurance issues with Prolia. On calcium  and vitamin D3. - Reduce calcium  intake to 400 mg twice daily, given new onset nephrolithiasis. - Follow up with endocrinology regarding Prolia authorization. - Refer to pharmacist for assistance with insurance and medication coverage issues.  Hypercholesterolemia Managed with atorvastatin . Last cholesterol levels satisfactory. - Continue atorvastatin  as prescribed.  Chronic kidney disease stage 2, mildly decreased GFR Noted.  Recheck CMP today.   Continue to optimize risk factors.  No acute concerns.  Continue to monitor.    Gastroesophageal reflux disease (GERD); high risk medication use GERD managed with pantoprazole . Concerns about long-term use due to family history of esophageal cancer. - Continue pantoprazole  as prescribed. - Check vitamin B12 level as noted  PTSD (posttraumatic stress disorder); fibromyalgia Recently started on bupropion. Effectiveness not yet determined.  Also on duloxetine  for dual benefit for mood and fibromyalgia.  Anemia, unspecified Blood work ordered to monitor status. - Order CBC to monitor anemia status.  Acquired hypothyroidism TSH ordered by psychiatry - reordered labs due to patient preference, related to insurance concerns.  Dorn Der, NP, CCed on results.    Return in about 6 months (around 12/21/2024) for chronic f/u, plus mAWV with AWV nurse, Chronic f/u.      I discussed the assessment and treatment plan with the patient  The patient was provided an opportunity to ask questions and all were answered. The patient agreed with the plan and demonstrated an understanding of the instructions.   The patient was advised to call back or seek an in-person evaluation if the symptoms worsen or if the condition fails to improve as anticipated.    LAURAINE LOISE BUOY, DO  [facility] [phone] (phone) [phone] (fax)  [facility]

## 2024-06-22 NOTE — Patient Instructions (Addendum)
   To do:  - go to [employer] to copy and send your insurance information to get your CPAP.  - get your blood drawn  - follow up with endocrinology about prolia.

## 2024-06-22 NOTE — Assessment & Plan Note (Signed)
 Started taking calcium  400 mg - two tablets twice daily. However, has been having recurrent kidney stones. Recommend decreasing calcium  to one tablet twice daily and continue to monitor,  Following with endocrinology; defer to specialist management. - continue supplemental calcium  (at reduced dose) and vitamin D .

## 2024-06-23 ENCOUNTER — Ambulatory Visit (INDEPENDENT_AMBULATORY_CARE_PROVIDER_SITE_OTHER): Admitting: Urology

## 2024-06-23 ENCOUNTER — Telehealth: Payer: Self-pay

## 2024-06-23 VITALS — BP 153/81 | HR 77 | Wt 160.0 lb

## 2024-06-23 DIAGNOSIS — N3281 Overactive bladder: Secondary | ICD-10-CM | POA: Diagnosis not present

## 2024-06-23 DIAGNOSIS — N200 Calculus of kidney: Secondary | ICD-10-CM

## 2024-06-23 LAB — CBC WITH DIFFERENTIAL/PLATELET
Basophils Absolute: 0 x10E3/uL (ref 0.0–0.2)
Basos: 0 %
EOS (ABSOLUTE): 0.2 x10E3/uL (ref 0.0–0.4)
Eos: 2 %
Hematocrit: 39.4 % (ref 34.0–46.6)
Hemoglobin: 13 g/dL (ref 11.1–15.9)
Immature Grans (Abs): 0 x10E3/uL (ref 0.0–0.1)
Immature Granulocytes: 0 %
Lymphocytes Absolute: 1.7 x10E3/uL (ref 0.7–3.1)
Lymphs: 25 %
MCH: 31.9 pg (ref 26.6–33.0)
MCHC: 33 g/dL (ref 31.5–35.7)
MCV: 97 fL (ref 79–97)
Monocytes Absolute: 0.6 x10E3/uL (ref 0.1–0.9)
Monocytes: 9 %
Neutrophils Absolute: 4.5 x10E3/uL (ref 1.4–7.0)
Neutrophils: 64 %
Platelets: 283 x10E3/uL (ref 150–450)
RBC: 4.07 x10E6/uL (ref 3.77–5.28)
RDW: 13.3 % (ref 11.7–15.4)
WBC: 7.1 x10E3/uL (ref 3.4–10.8)

## 2024-06-23 LAB — VITAMIN B12: Vitamin B-12: 541 pg/mL (ref 232–1245)

## 2024-06-23 LAB — COMPREHENSIVE METABOLIC PANEL WITH GFR
ALT: 18 IU/L (ref 0–32)
AST: 21 IU/L (ref 0–40)
Albumin: 4 g/dL (ref 3.8–4.8)
Alkaline Phosphatase: 106 IU/L (ref 49–135)
BUN/Creatinine Ratio: 13 (ref 12–28)
BUN: 10 mg/dL (ref 8–27)
Bilirubin Total: 0.4 mg/dL (ref 0.0–1.2)
CO2: 25 mmol/L (ref 20–29)
Calcium: 9.1 mg/dL (ref 8.7–10.3)
Chloride: 105 mmol/L (ref 96–106)
Creatinine, Ser: 0.77 mg/dL (ref 0.57–1.00)
Globulin, Total: 2.1 g/dL (ref 1.5–4.5)
Glucose: 86 mg/dL (ref 70–99)
Potassium: 3.9 mmol/L (ref 3.5–5.2)
Sodium: 141 mmol/L (ref 134–144)
Total Protein: 6.1 g/dL (ref 6.0–8.5)
eGFR: 78 mL/min/1.73 (ref >59)

## 2024-06-23 LAB — THYROID PANEL WITH TSH
Free Thyroxine Index: 1.9 (ref 1.2–4.9)
T3 Uptake Ratio: 24 % (ref 24–39)
T4, Total: 8.1 ug/dL (ref 4.5–12.0)
TSH: 11.2 u[IU]/mL — ABNORMAL HIGH (ref 0.450–4.500)

## 2024-06-23 MED ORDER — GEMTESA 75 MG PO TABS: 75.0000 mg | ORAL_TABLET | Freq: Every day | ORAL | Status: AC
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
## 2024-06-23 NOTE — Progress Notes (Signed)
 Care Guide Pharmacy Note  06/23/2024 Name: [name] MRN: [id number] DOB: Mar 18, 1941  Referred By: Donzella Lauraine SAILOR, DO Reason for referral: Complex Care Management and Call Attempt #1 (Successful initial outreach scheduled with PHARM D-Allyson)   Nichole [name] is a 83 y.o. year old female who is a primary care patient of Pardue, Lauraine SAILOR, DO.  Chryl L Wickwire was referred to the pharmacist for assistance related to: Osteoperosis  Successful contact was made with the patient to discuss pharmacy services including being ready for the pharmacist to call at least 5 minutes before the scheduled appointment time and to have medication bottles and any blood pressure readings ready for review. The patient agreed to meet with the pharmacist via In Office on (date/time).07-16-24 @ 3 PM.  Leotis Rase [facility], [facility] Guide  Direct Dial: [phone]  Fax [phone]

## 2024-06-29 ENCOUNTER — Other Ambulatory Visit

## 2024-06-29 ENCOUNTER — Encounter: Payer: Self-pay | Admitting: "Endocrinology

## 2024-06-29 ENCOUNTER — Ambulatory Visit (INDEPENDENT_AMBULATORY_CARE_PROVIDER_SITE_OTHER): Admitting: "Endocrinology

## 2024-06-29 ENCOUNTER — Telehealth: Payer: Self-pay | Admitting: Neurology

## 2024-06-29 VITALS — BP 122/80 | HR 78 | Ht 62.0 in | Wt 164.0 lb

## 2024-06-29 DIAGNOSIS — M810 Age-related osteoporosis without current pathological fracture: Secondary | ICD-10-CM | POA: Diagnosis not present

## 2024-06-29 DIAGNOSIS — Z87442 Personal history of urinary calculi: Secondary | ICD-10-CM

## 2024-06-29 DIAGNOSIS — Z8639 Personal history of other endocrine, nutritional and metabolic disease: Secondary | ICD-10-CM | POA: Diagnosis not present

## 2024-06-29 LAB — VITAMIN D 25 HYDROXY (VIT D DEFICIENCY, FRACTURES): Vit D, 25-Hydroxy: 59 ng/mL (ref 30–100)

## 2024-06-29 MED ORDER — ALENDRONATE SODIUM 70 MG PO TABS: 70.0000 mg | ORAL_TABLET | ORAL | 3 refills | Status: AC

## 2024-06-29 NOTE — Patient Instructions (Signed)
  You should collect every drop of urine during each 24 hour period. It does not matter how much or little urine is passed each time, as long as every drop is collected.   Begin the urine collection in the morning after you wake up.  The first time your empty your bladder, flush it down the toilet because that was yesterday's urine. Write down the exact time (eg, 6:15 AM). You will begin the urine collection at this time.  Every other time you urinate for the rest of the day and through the night, the urine goes into the jug.  Also the first time you empty your bladder the next morning, that goes into the jug.  This should be within 10 minutes before or after the time of the first morning void on the first day (which was flushed).  Bring the jug back to the lab when you have completed this.    If you need to urinate one hour before the final collection time, drink a full glass of water so that you can void again at the appropriate time. If you have to urinate 20 minutes before, try to hold the urine until the proper time.  Please note the exact time of the final collection, even if it is not the same time as when collection began on day one.    Store the jug in the refrigerator.  If you need to have a bowel movement, any urine passed with the bowel movement should be collected. Try not to include feces with the urine collection. If feces does get mixed in, do not try to remove the feces from the urine collection bottle.

## 2024-06-29 NOTE — Telephone Encounter (Signed)
 Pt. Has burning sensation on both feet and legs since saturday, would like to know next steps
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
## 2024-06-30 ENCOUNTER — Telehealth: Payer: Self-pay

## 2024-06-30 DIAGNOSIS — F331 Major depressive disorder, recurrent, moderate: Secondary | ICD-10-CM

## 2024-06-30 MED ORDER — BUPROPION HCL ER (XL) 150 MG PO TB24: 150.0000 mg | ORAL_TABLET | Freq: Every day | ORAL | 0 refills | Status: DC

## 2024-06-30 NOTE — Telephone Encounter (Signed)
 Sent a 30-day supply of Wellbutrin XL 150 mg to her pharmacy.  Please inform the patient.  Thank you

## 2024-06-30 NOTE — Progress Notes (Signed)
 Pt has been approved for Prolia in office  [facility] [employer]   Patient is ready for scheduling on or after 01/25/24   Out-of-pocket cost due at time of visit: $94     Primary:   Medicare Prolia co-insurance: 0% Admin fee co-insurance: 0%   Deductible: does not apply   Prior Auth: NOT required   Secondary: A[employer]na Medicare Supplemental POS II Prolia co-insurance:  plan will coordinate benefits and  consider the Part B deductible and coinsurance at 100%, after the patient satisfies a $94 copay. Admin fee co-insurance:  plan will coordinate benefits and  consider the Part B deductible and coinsurance at 100%, after the patient satisfies a $94 copay.   Deductible: does not apply   Prior Auth: APPROVED PA# [id number] Valid: 01/25/24-01/23/25   ** This summary of benefits is an estimation of the patient's out-of-pocket cost. Exact cost may vary based on individual plan coverage.

## 2024-06-30 NOTE — Telephone Encounter (Signed)
 Received fax from patients pharmacy requesting a refill of buPROPion (WELLBUTRIN XL) 150 MG 24 hr tablet   Last visit 06-02-24 Next visit 07-07-24    Preferred Pharmacies   [id number] [address], [place] [address] Phone: [phone]  Fax: [phone]

## 2024-06-30 NOTE — Addendum Note (Signed)
 Addended by: CURRY PATERSON T on: 06/30/2024 11:03 AM   Modules accepted: Orders

## 2024-07-07 ENCOUNTER — Telehealth: Payer: Self-pay

## 2024-07-07 ENCOUNTER — Other Ambulatory Visit

## 2024-07-07 ENCOUNTER — Encounter: Payer: Self-pay | Admitting: Psychiatry

## 2024-07-07 ENCOUNTER — Ambulatory Visit (INDEPENDENT_AMBULATORY_CARE_PROVIDER_SITE_OTHER): Admitting: Psychiatry

## 2024-07-07 VITALS — BP 112/64 | HR 74 | Temp 98.2°F | Ht 60.0 in | Wt 164.0 lb

## 2024-07-07 DIAGNOSIS — Z87442 Personal history of urinary calculi: Secondary | ICD-10-CM | POA: Diagnosis not present

## 2024-07-07 DIAGNOSIS — F331 Major depressive disorder, recurrent, moderate: Secondary | ICD-10-CM | POA: Diagnosis not present

## 2024-07-07 DIAGNOSIS — M810 Age-related osteoporosis without current pathological fracture: Secondary | ICD-10-CM | POA: Diagnosis not present

## 2024-07-07 DIAGNOSIS — G43009 Migraine without aura, not intractable, without status migrainosus: Secondary | ICD-10-CM

## 2024-07-07 DIAGNOSIS — Z636 Dependent relative needing care at home: Secondary | ICD-10-CM | POA: Diagnosis not present

## 2024-07-07 DIAGNOSIS — R41840 Attention and concentration deficit: Secondary | ICD-10-CM

## 2024-07-07 DIAGNOSIS — F411 Generalized anxiety disorder: Secondary | ICD-10-CM

## 2024-07-07 MED ORDER — BUSPIRONE HCL 15 MG PO TABS: ORAL_TABLET | ORAL | 2 refills | Status: DC
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
## 2024-07-07 NOTE — Telephone Encounter (Signed)
 Medication Management - Met with patient outside the [facility] office after this nurse manager was called down to assist patient who had fallen on the curb of the building when leaving and had a quarter sized abrasion on her right knee.  Patient also had a pinkie sized abrasion on the outside knuckle of her right had.  Patient was standing with assistance from our staff when this RN arrived as was reported one of our therapists found patient siting on curb of the building when leaving and assisted her up.  Patient informed staff she had been there quite some time, almost an hour and no one stopped at the front of the building to help her up.  She had been setting on the curb that she reported falling from when she stepped off.  Our CMA and patient access staff had also responded when therapist called them inside the building requesting help with standing the patient back up.  When RN arrived patient was able to walk and set down in her drivers side car seat, approximately 6 paces from where she was found with assistance.  Patient reported I am fine and denied any other injuries except stated her right arm, around her elbow and shoulder areas were sore as well from the fall but also admitted this was from a previous injury with tearing her rotator cuff, recently helping her daughter into a providers office for the daughter and no medical staff there would assist her.  Patient denied any injury to her head and denied that she fell that far down.  Cleaned quarter sized abrasion on patient's right knee and placed 2 small band aids over as patient denied need to be seen at this time by any other providers or anyone else.  Patient agreed to call when she gets home to let us  know she is there safely and warned patient if any change in pain, increased swelling, any more bleeding, temperature, headaches even though patient denied multiple times she did not hit her head or anywhere close to  this, then patient would seek emergency care or call 911.  Patient left without any further injury or incident.  Patient agreed to follow up with PCP if sore on tomorrow or any worsening symptoms from fall injuries.  Patient stated she was going to apply ice to her right knee upon arriving home to assist with stopping soreness expected. Patient to keep our office informed and agreed to also check in with us  on tomorrow as well.  Patient informed staff and provider this date she has fallen 3 times recently and told provider, she fell at [facility] and at the fair.  NP for patient informed this date of patient's fall and follow up expectations patient would agree to at this time. Debriefed with staff who found patient sitting on the curb of the building as no one witnessed patient's reported fall but therapist found her sitting on the curb close to her car and called into our office for assistance.

## 2024-07-08 ENCOUNTER — Telehealth: Payer: Self-pay

## 2024-07-08 LAB — CALCIUM, 24-HOUR URINE WITH CREATININE
CALCIUM/CREATININE RATIO: 183 mg/g{creat} (ref 30–275)
Calcium, 24H Urine: 139 mg/(24.h)
Creatinine, 24H Ur: 0.72 g/(24.h) (ref 0.50–2.15)

## 2024-07-08 NOTE — Telephone Encounter (Signed)
 Medication management - Message left for patient that this nurse manager was calling to check on her today to see how she was feeling and requested she call our office back with an update.

## 2024-07-09 ENCOUNTER — Other Ambulatory Visit: Payer: Self-pay

## 2024-07-09 DIAGNOSIS — N200 Calculus of kidney: Secondary | ICD-10-CM

## 2024-07-09 DIAGNOSIS — S46911A Strain of unspecified muscle, fascia and tendon at shoulder and upper arm level, right arm, initial encounter: Secondary | ICD-10-CM | POA: Diagnosis not present

## 2024-07-09 DIAGNOSIS — S8001XA Contusion of right knee, initial encounter: Secondary | ICD-10-CM | POA: Diagnosis not present
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
## 2024-07-10 ENCOUNTER — Ambulatory Visit: Payer: Self-pay | Admitting: Family Medicine

## 2024-07-13 ENCOUNTER — Other Ambulatory Visit: Payer: Self-pay

## 2024-07-13 ENCOUNTER — Ambulatory Visit: Admitting: Urology

## 2024-07-13 DIAGNOSIS — N200 Calculus of kidney: Secondary | ICD-10-CM

## 2024-07-16 ENCOUNTER — Ambulatory Visit (INDEPENDENT_AMBULATORY_CARE_PROVIDER_SITE_OTHER)

## 2024-07-16 DIAGNOSIS — M810 Age-related osteoporosis without current pathological fracture: Secondary | ICD-10-CM | POA: Diagnosis not present

## 2024-07-16 DIAGNOSIS — S8001XA Contusion of right knee, initial encounter: Secondary | ICD-10-CM | POA: Diagnosis not present

## 2024-07-16 DIAGNOSIS — S46911A Strain of unspecified muscle, fascia and tendon at shoulder and upper arm level, right arm, initial encounter: Secondary | ICD-10-CM | POA: Diagnosis not present

## 2024-07-16 NOTE — Progress Notes (Signed)
   07/16/2024 Name: DELAINEE TRAMEL MRN: [id number] DOB: 11/23/1940  Epifanio LITTIE Conway is a 83 y.o. year old female who was referred for medication management by their primary care provider, Pardue, Sarah N, DO. They presented for a face to face visit today.   They were referred to the pharmacist by their PCP for assistance in managing medication access    Subjective:  Care Team: Primary Care Provider: Donzella Lauraine SAILOR, DO  Medication Access/Adherence  Current Pharmacy:  [facility] [phone], [place] - [address] [place] [address] Phone: [phone] Fax: [phone]   Osteoporosis:  Current medications: alendronate  70 mg weekly  Current supplements: calcium  carbonate 600 mg BID, cholecalciferol 1000 units daily  Most recent DEXA: 12/2023 Lumbar spine T-score: -3.2 Z-score: -1.3 Left femoral neck T-score: -2 Z-score: 0.2 Right femoral neck: T-score: -2.5 Z-score: -0.3 Left total hip: T-score: -0.9 Z-score: 1.1 Right total hip: T-score: -2 Z-score: 0.1  History of fragility fracture.   Weight management:  Patient was previously prescribed Zepbound  2.5 mg weekly for weight management, however, this was never started d/t cost.   Objective:  No results found for: HGBA1C  Lab Results  Component Value Date   CREATININE 0.77 06/22/2024   BUN 10 06/22/2024   NA 141 06/22/2024   K 3.9 06/22/2024   CL 105 06/22/2024   CO2 25 06/22/2024    Lab Results  Component Value Date   CHOL 135 01/17/2023   HDL 54 01/17/2023   LDLCALC 65 01/17/2023   TRIG 85 01/17/2023   CHOLHDL 3.1 01/12/2021    Medications Reviewed Today   Medications were not reviewed in this encounter       Assessment/Plan:   Obesity/Overweight: - Currently unable to achieve goal weight loss of 5-10% through diet and lifestyle modifications alone - Extensive dietary counseling including education on focus on lean proteins, fruits and vegetables, whole  grains and increased fiber consumption, adequate hydration - Extensive exercise counseling including eventual goal of 150 minutes of moderate intensity exercise weekly - Provided motivational interviewing. Discussed setting non-weight based goals - May re-evaluate cost of GLP1 therapies in the new year as it appears that prices may be capped further in 12/2024   Osteoporosis: - Reviewed recommendation for daily calcium  intake of 1200 mg and vitamin D  intake of [phone] units - Prolia is $94 on medical benefits (co-insurance) per medical buy and bill -Recommend patient schedule appointment for initial injectoin    Follow Up Plan:   -Endo on 09/29/24 -Pharmacist on 01/06/25  Peyton CHARLENA Ferries, PharmD, CPP Clinical Pharmacist [facility] [phone]
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
## 2024-07-22 ENCOUNTER — Ambulatory Visit (INDEPENDENT_AMBULATORY_CARE_PROVIDER_SITE_OTHER): Admitting: Urology

## 2024-07-22 ENCOUNTER — Encounter: Payer: Self-pay | Admitting: Urology

## 2024-07-22 ENCOUNTER — Ambulatory Visit
Admission: RE | Admit: 2024-07-22 | Discharge: 2024-07-22 | Disposition: A | Discharge status: Home / Self Care | Attending: Urology | Admitting: Urology

## 2024-07-22 ENCOUNTER — Ambulatory Visit
Admission: RE | Admit: 2024-07-22 | Discharge: 2024-07-22 | Disposition: A | Discharge status: Home / Self Care | Source: Ambulatory Visit | Attending: Urology | Admitting: Urology

## 2024-07-22 VITALS — BP 97/62 | HR 65 | Ht 62.0 in | Wt 160.0 lb

## 2024-07-22 DIAGNOSIS — N3281 Overactive bladder: Secondary | ICD-10-CM | POA: Diagnosis not present

## 2024-07-22 DIAGNOSIS — N200 Calculus of kidney: Secondary | ICD-10-CM | POA: Diagnosis not present

## 2024-07-22 DIAGNOSIS — N201 Calculus of ureter: Secondary | ICD-10-CM | POA: Diagnosis not present

## 2024-07-22 DIAGNOSIS — I878 Other specified disorders of veins: Secondary | ICD-10-CM | POA: Diagnosis not present

## 2024-07-22 LAB — MICROSCOPIC EXAMINATION

## 2024-07-22 MED ORDER — GEMTESA 75 MG PO TABS: 75.0000 mg | ORAL_TABLET | Freq: Every day | ORAL | 3 refills | Status: AC

## 2024-07-22 NOTE — Progress Notes (Signed)
 In and Out Catheterization  Patient is present today for a I & O catheterization due to unable to provide UA. Patient was cleaned and prepped in a sterile fashion with betadine . A 14FR cath was inserted no complications were noted , 0ml of urine return was noted, urine was dark yellow in color. A clean urine sample was collected for UA. Bladder was drained  And catheter was removed with out difficulty.    Performed by: Myrtha Dante LATHER  Follow up/ Additional notes: Foolow up with Gemtesa.

## 2024-07-22 NOTE — Patient Instructions (Signed)
Kidney stones can form due to one or all of the following:   1. Low fluid Intake  2. Low citrate in the urine  3. Too much salt, oxalate, and animal protein   The following dietary changes may decrease your risk of forming stones.   1. Increase your fluid intake to around 2.5 - 3 liters per day. Diluting the urine hinders the formation of stones. You should drink enough fluid to make 2 liters of urine a day. You know you are drinking enough fluid if your urine is clear.   2. Decrease your salt intake to less than 2000mg/day  This will decrease the amount of calcium excreted in your urine. Eat more fresh or frozen vegetables instead of canned. Eat less processed meats. At restaurants request your food to be prepared without salt or high sodium seasoning.   3. Limit the amount of Oxalate containing foods in your diet to 50mg per day.  This will decrease the amount of oxalate excreted in your urine.   Spinach  Potato Nuts  Peanut Butter  Chocolate   4. Limit the amount of animal proteins in your diet to approximately the size of 1 deck of cards per meal This will decrease the amount of uric acid excreted in your urine.   Fish  Liver  Chicken  Red Meat - no more than 3 servings per week   5. Increase the amount of citrate in your diet. Most fruits and vegetables are high in citrate. Increasing the amount of fruit and vegetable your diet will raise your citrate.  Lemons and limes have the highest amount of citrate. A recipe for lemonade may be helpful to increase citrate in your diet: 1/2 cup concentrated lemon juice to 2 quarts of water. Sweeten to taste.  

## 2024-07-23 ENCOUNTER — Telehealth: Payer: Self-pay

## 2024-07-23 LAB — URINALYSIS, COMPLETE
Bilirubin, UA: NEGATIVE
Glucose, UA: NEGATIVE
Ketones, UA: NEGATIVE
Leukocytes,UA: NEGATIVE
Nitrite, UA: NEGATIVE
Protein,UA: NEGATIVE
RBC, UA: NEGATIVE
Specific Gravity, UA: 1.015 (ref 1.005–1.030)
Urobilinogen, Ur: 0.2 mg/dL (ref 0.2–1.0)
pH, UA: 6 (ref 5.0–7.5)

## 2024-07-23 LAB — MICROSCOPIC EXAMINATION

## 2024-07-23 NOTE — Telephone Encounter (Signed)
 Stated Prior Authorization on covermymeds:   Your PA request has been approved. Additional information will be provided in the approval communication.

## 2024-07-24 ENCOUNTER — Other Ambulatory Visit: Payer: Self-pay | Admitting: Family Medicine

## 2024-07-24 DIAGNOSIS — Z8673 Personal history of transient ischemic attack (TIA), and cerebral infarction without residual deficits: Secondary | ICD-10-CM

## 2024-07-24 DIAGNOSIS — E7800 Pure hypercholesterolemia, unspecified: Secondary | ICD-10-CM

## 2024-07-25 LAB — CULTURE, URINE COMPREHENSIVE

## 2024-07-26 ENCOUNTER — Ambulatory Visit: Payer: Self-pay | Admitting: Urology

## 2024-07-29 ENCOUNTER — Other Ambulatory Visit: Payer: Self-pay | Admitting: Psychiatry

## 2024-07-29 DIAGNOSIS — S8001XA Contusion of right knee, initial encounter: Secondary | ICD-10-CM | POA: Diagnosis not present

## 2024-07-29 DIAGNOSIS — F331 Major depressive disorder, recurrent, moderate: Secondary | ICD-10-CM

## 2024-07-29 DIAGNOSIS — S82091A Other fracture of right patella, initial encounter for closed fracture: Secondary | ICD-10-CM | POA: Diagnosis not present

## 2024-07-29 DIAGNOSIS — M23321 Other meniscus derangements, posterior horn of medial meniscus, right knee: Secondary | ICD-10-CM | POA: Diagnosis not present

## 2024-07-29 DIAGNOSIS — S80221A Blister (nonthermal), right knee, initial encounter: Secondary | ICD-10-CM | POA: Diagnosis not present

## 2024-07-29 DIAGNOSIS — M879 Osteonecrosis, unspecified: Secondary | ICD-10-CM | POA: Diagnosis not present

## 2024-08-04 ENCOUNTER — Ambulatory Visit: Admitting: Psychiatry

## 2024-08-06 DIAGNOSIS — S82001A Unspecified fracture of right patella, initial encounter for closed fracture: Secondary | ICD-10-CM | POA: Diagnosis not present

## 2024-08-07 ENCOUNTER — Ambulatory Visit

## 2024-08-11 ENCOUNTER — Ambulatory Visit: Admitting: Psychiatry
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
## 2024-08-12 ENCOUNTER — Ambulatory Visit: Admitting: Psychiatry

## 2024-08-12 ENCOUNTER — Encounter: Payer: Self-pay | Admitting: Neurology

## 2024-08-12 ENCOUNTER — Ambulatory Visit: Admitting: Neurology

## 2024-08-14 ENCOUNTER — Ambulatory Visit

## 2024-08-14 DIAGNOSIS — M810 Age-related osteoporosis without current pathological fracture: Secondary | ICD-10-CM | POA: Diagnosis not present

## 2024-08-14 MED ORDER — DENOSUMAB 60 MG/ML ~~LOC~~ SOSY: 60.0000 mg | PREFILLED_SYRINGE | Freq: Once | SUBCUTANEOUS | Status: AC

## 2024-08-14 MED ORDER — DENOSUMAB 60 MG/ML ~~LOC~~ SOSY
60.0000 mg | PREFILLED_SYRINGE | Freq: Once | SUBCUTANEOUS | Status: AC
  Administered 2024-08-14: 60 mg via SUBCUTANEOUS

## 2024-08-14 NOTE — Progress Notes (Cosign Needed Addendum)
 Pt was in office today for her 1 prolia injection given in the right arm with no trouble. Advise pt to wait in lobby 30 mins in case or an reactive.

## 2024-08-18 ENCOUNTER — Encounter: Payer: Self-pay | Admitting: Psychiatry

## 2024-08-18 ENCOUNTER — Ambulatory Visit: Admitting: Psychiatry

## 2024-08-18 ENCOUNTER — Other Ambulatory Visit: Payer: Self-pay

## 2024-08-18 VITALS — BP 110/64 | HR 67 | Temp 97.5°F | Ht 62.0 in | Wt 163.2 lb

## 2024-08-18 DIAGNOSIS — F411 Generalized anxiety disorder: Secondary | ICD-10-CM

## 2024-08-18 DIAGNOSIS — Z636 Dependent relative needing care at home: Secondary | ICD-10-CM

## 2024-08-18 DIAGNOSIS — R41840 Attention and concentration deficit: Secondary | ICD-10-CM | POA: Diagnosis not present

## 2024-08-18 DIAGNOSIS — F331 Major depressive disorder, recurrent, moderate: Secondary | ICD-10-CM | POA: Diagnosis not present

## 2024-08-18 MED ORDER — BUSPIRONE HCL 15 MG PO TABS: ORAL_TABLET | ORAL | 2 refills | Status: AC

## 2024-08-18 MED ORDER — BUPROPION HCL ER (XL) 150 MG PO TB24: 150.0000 mg | ORAL_TABLET | Freq: Every day | ORAL | 2 refills | Status: AC
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
## 2024-08-25 NOTE — Telephone Encounter (Signed)
 Last Prolia  inj 08/14/24 Next Prolia  inj due 02/13/25

## 2024-08-31 ENCOUNTER — Other Ambulatory Visit: Payer: Self-pay | Admitting: Family Medicine

## 2024-08-31 DIAGNOSIS — K219 Gastro-esophageal reflux disease without esophagitis: Secondary | ICD-10-CM
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
## 2024-09-04 ENCOUNTER — Ambulatory Visit: Attending: Physician Assistant | Admitting: Physician Assistant

## 2024-09-07 ENCOUNTER — Ambulatory Visit: Payer: Self-pay

## 2024-09-07 ENCOUNTER — Emergency Department

## 2024-09-07 ENCOUNTER — Other Ambulatory Visit: Payer: Self-pay

## 2024-09-07 ENCOUNTER — Emergency Department
Admission: EM | Admit: 2024-09-07 | Discharge: 2024-09-07 | Disposition: A | Discharge status: Home / Self Care | Attending: Emergency Medicine | Admitting: Emergency Medicine

## 2024-09-07 DIAGNOSIS — R051 Acute cough: Secondary | ICD-10-CM | POA: Diagnosis not present

## 2024-09-07 DIAGNOSIS — R059 Cough, unspecified: Secondary | ICD-10-CM | POA: Diagnosis present

## 2024-09-07 DIAGNOSIS — R0789 Other chest pain: Secondary | ICD-10-CM | POA: Diagnosis not present

## 2024-09-07 DIAGNOSIS — N189 Chronic kidney disease, unspecified: Secondary | ICD-10-CM | POA: Diagnosis not present

## 2024-09-07 DIAGNOSIS — R6889 Other general symptoms and signs: Secondary | ICD-10-CM

## 2024-09-07 LAB — COMPREHENSIVE METABOLIC PANEL WITH GFR
ALT: 13 U/L (ref 0–44)
AST: 28 U/L (ref 15–41)
Albumin: 3.9 g/dL (ref 3.5–5.0)
Alkaline Phosphatase: 100 U/L (ref 38–126)
Anion gap: 12 (ref 5–15)
BUN: 9 mg/dL (ref 8–23)
CO2: 20 mmol/L — ABNORMAL LOW (ref 22–32)
Calcium: 8.9 mg/dL (ref 8.9–10.3)
Chloride: 102 mmol/L (ref 98–111)
Creatinine, Ser: 0.73 mg/dL (ref 0.44–1.00)
GFR, Estimated: >60 mL/min
Glucose, Bld: 100 mg/dL — ABNORMAL HIGH (ref 70–99)
Potassium: 4.3 mmol/L (ref 3.5–5.1)
Sodium: 134 mmol/L — ABNORMAL LOW (ref 135–145)
Total Bilirubin: 0.3 mg/dL (ref 0.0–1.2)
Total Protein: 6.6 g/dL (ref 6.5–8.1)

## 2024-09-07 LAB — CBC WITH DIFFERENTIAL/PLATELET
Abs Immature Granulocytes: 0.02 K/uL (ref 0.00–0.07)
Basophils Absolute: 0 K/uL (ref 0.0–0.1)
Basophils Relative: 1 %
Eosinophils Absolute: 0.2 K/uL (ref 0.0–0.5)
Eosinophils Relative: 3 %
HCT: 39 % (ref 36.0–46.0)
Hemoglobin: 13.1 g/dL (ref 12.0–15.0)
Immature Granulocytes: 0 %
Lymphocytes Relative: 25 %
Lymphs Abs: 1.6 K/uL (ref 0.7–4.0)
MCH: 32.2 pg (ref 26.0–34.0)
MCHC: 33.6 g/dL (ref 30.0–36.0)
MCV: 95.8 fL (ref 80.0–100.0)
Monocytes Absolute: 0.6 K/uL (ref 0.1–1.0)
Monocytes Relative: 9 %
Neutro Abs: 4 K/uL (ref 1.7–7.7)
Neutrophils Relative %: 62 %
Platelets: 232 K/uL (ref 150–400)
RBC: 4.07 MIL/uL (ref 3.87–5.11)
RDW: 12 % (ref 11.5–15.5)
WBC: 6.4 K/uL (ref 4.0–10.5)
nRBC: 0 % (ref 0.0–0.2)

## 2024-09-07 MED ORDER — ALBUTEROL SULFATE HFA 108 (90 BASE) MCG/ACT IN AERS: 2.0000 | INHALATION_SPRAY | Freq: Four times a day (QID) | RESPIRATORY_TRACT | 0 refills | Status: AC | PRN

## 2024-09-07 MED ORDER — GUAIFENESIN ER 600 MG PO TB12: 600.0000 mg | ORAL_TABLET | Freq: Two times a day (BID) | ORAL | 0 refills | Status: AC

## 2024-09-07 MED ORDER — IPRATROPIUM-ALBUTEROL 0.5-2.5 (3) MG/3ML IN SOLN
3.0000 mL | Freq: Once | RESPIRATORY_TRACT | Status: AC
  Administered 2024-09-07: 3 mL via RESPIRATORY_TRACT
  Filled 2024-09-07: qty 3

## 2024-09-07 NOTE — Telephone Encounter (Signed)
 FYI Only or Action Required?: FYI only for provider: Heading to hospital.  Patient was last seen in primary care on 06/22/2024 by Donzella Lauraine SAILOR, DO.  Called Nurse Triage reporting Cough, Shortness of Breath, and Chest Pain.  Symptoms began several days ago.  Interventions attempted: Rest, hydration, or home remedies.  Symptoms are: rapidly worsening.  Triage Disposition: Call EMS 911 Now  Patient/caregiver understands and will follow disposition?: Yes     Copied from CRM #[id number]. Topic: Clinical - Red Word Triage >> Sep 07, 2024  2:45 PM Thersia BROCKS wrote: Red Word that prompted transfer to Nurse Triage: Patient called in stated she has had a very bad cough, throat pain , congestion in chest Reason for Disposition  [1] Chest pain lasts > 5 minutes AND [2] age > 81  Answer Assessment - Initial Assessment Questions This RN recommended pt be examined in hospital asap for symptoms, pt refusing ambulance or someone else driving her, insisting on driving herself, advised pull over and call ambulance if any sort of worsening.    Chest is real tight Started yesterday morning very suddenly Coughing up green stuff Chest pain upper chest, just when I breathe Started coughing just yesterday Nurse hearing some labored breathing over the phone Feeling very very tired Dizzy Not like going to pass out No abnormal sounds when breathing Start coughing and can't stop Denies asthma or COPD Have inhaler but only use for 2 months out of the whole year, not used it, don't know if expired, don't know where it is Not had this chest pain Nurse hearing some potential wheezing over phone What about one of those anytime doctors ([facility]) Like a tight rope around chest Would drive herself Strongly advised someone else drive her or call ambulance, pt refusing I'm the driver in the family, have no one else to drive, refusing calling ambulance since no one to pick her up, advised could be arranged Advised  hospital asap Don't know if have fever or not, temp normally 97.4 so minor fever for me, 98.2 F  Protocols used: Chest Pain-A-AH

## 2024-09-07 NOTE — ED Provider Notes (Signed)
 "  [facility] Provider Note    Event Date/Time   First MD Initiated Contact with Patient 09/07/24 1829     (approximate)   History   Chief Complaint Cough   HPI  Nichole [name] is a 84 y.o. female with past medical history of migraines, fibromyalgia, hyperlipidemia, and CKD who presents to the ED complaining of cough.  Patient reports that she has had a cough productive of greenish sputum over the past 2 days.  She denies any associated fevers and has not had any shortness of breath, does report some soreness over the left chest wall when she coughs.  She denies any nausea or vomiting and has not had any changes in her bowel movements, is not aware of any sick contacts.     Physical Exam   Triage Vital Signs: ED Triage Vitals  Encounter Vitals Group     BP 09/07/24 1639 (!) 150/83     Girls Systolic BP Percentile --      Girls Diastolic BP Percentile --      Boys Systolic BP Percentile --      Boys Diastolic BP Percentile --      Pulse Rate 09/07/24 1639 69     Resp 09/07/24 1639 18     Temp 09/07/24 1639 98.2 F (36.8 C)     Temp src --      SpO2 09/07/24 1639 97 %     Weight 09/07/24 1640 160 lb (72.6 kg)     Height 09/07/24 1640 5' 2 (1.575 m)     Head Circumference --      Peak Flow --      Pain Score 09/07/24 1639 6     Pain Loc --      Pain Education --      Exclude from Growth Chart --     Most recent vital signs: Vitals:   09/07/24 1639  BP: (!) 150/83  Pulse: 69  Resp: 18  Temp: 98.2 F (36.8 C)  SpO2: 97%    Constitutional: Alert and oriented. Eyes: Conjunctivae are normal. Head: Atraumatic. Nose: No congestion/rhinnorhea. Mouth/Throat: Mucous membranes are moist.  Cardiovascular: Normal rate, regular rhythm. Grossly normal heart sounds.  2+ radial pulses bilaterally. Respiratory: Normal respiratory effort.  No retractions. Lungs CTAB.  Left chest wall tenderness to palpation noted. Gastrointestinal: Soft and  nontender. No distention. Musculoskeletal: No lower extremity tenderness nor edema.  Neurologic:  Normal speech and language. No gross focal neurologic deficits are appreciated.    ED Results / Procedures / Treatments   Labs (all labs ordered are listed, but only abnormal results are displayed) Labs Reviewed  COMPREHENSIVE METABOLIC PANEL WITH GFR - Abnormal; Notable for the following components:      Result Value   Sodium 134 (*)    CO2 20 (*)    Glucose, Bld 100 (*)    All other components within normal limits  CBC WITH DIFFERENTIAL/PLATELET    RADIOLOGY Chest x-ray reviewed and interpreted by me with no infiltrate, edema, or effusion.  PROCEDURES:  Critical Care performed: No  Procedures   MEDICATIONS ORDERED IN ED: Medications  ipratropium-albuterol  (DUONEB) 0.5-2.5 (3) MG/3ML nebulizer solution 3 mL (3 mLs Nebulization Given 09/07/24 2029)     IMPRESSION / MDM / ASSESSMENT AND PLAN / ED COURSE  I reviewed the triage vital signs and the nursing notes.  84 y.o. female with past medical history of migraines, fibromyalgia, hyperlipidemia, and CKD who presents to the ED complaining of productive cough for the past 2 days with soreness over her left chest wall.  Patient's presentation is most consistent with acute presentation with potential threat to life or bodily function.  Differential diagnosis includes, but is not limited to, ACS, PE, pneumonia, pneumothorax, influenza, muscle strain.  Patient well-appearing and in no acute distress, vital signs are unremarkable.  She is not in any respiratory distress and is maintaining oxygen saturations at 97% on room air.  Pain is reproducible with palpation and suspect muscle soreness due to frequent coughing.  Chest x-ray is negative for acute finding, no evidence of pneumonia.  Labs without significant anemia, leukocytosis, electrolyte abnormality, or AKI.  Patient appropriate for discharge with  outpatient follow-up, suspect viral illness but flu testing unfortunately limited at this time.  We will treat symptomatically with albuterol  and Mucinex , she was counseled to follow-up with her PCP and to return to the ED for new or worsening symptoms.  Patient agrees with plan.      FINAL CLINICAL IMPRESSION(S) / ED DIAGNOSES   Final diagnoses:  Acute cough  Flu-like symptoms     Rx / DC Orders   ED Discharge Orders          Ordered    albuterol  (VENTOLIN  HFA) 108 (90 Base) MCG/ACT inhaler  Every 6 hours PRN       Note to Pharmacy: Please supply with spacer   09/07/24 2033    guaiFENesin  (MUCINEX ) 600 MG 12 hr tablet  2 times daily        09/07/24 2033             Note:  This document was prepared using Dragon voice recognition software and may include unintentional dictation errors.   Willo Dunnings, MD 09/07/24 2036  "

## 2024-09-07 NOTE — ED Triage Notes (Signed)
 Pt comes in via pov with complaints of  cough and body aches since yesterday. Pt states that she tries not to take too deep of a breath because it makes her cough. Pt complains of generalized body aches 6/10 at this time. Pt denies chest pain and SOB. Pt is alert and oriented x4 with no signs of acute distress at this time.

## 2024-09-23 ENCOUNTER — Other Ambulatory Visit

## 2024-09-23 ENCOUNTER — Ambulatory Visit (INDEPENDENT_AMBULATORY_CARE_PROVIDER_SITE_OTHER): Admitting: Neurology

## 2024-09-23 VITALS — BP 173/90 | HR 69 | Ht 62.0 in | Wt 167.0 lb

## 2024-09-23 DIAGNOSIS — G629 Polyneuropathy, unspecified: Secondary | ICD-10-CM

## 2024-09-23 DIAGNOSIS — R296 Repeated falls: Secondary | ICD-10-CM
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
## 2024-09-23 NOTE — Patient Instructions (Addendum)
 Nerve conduction study of bilateral legs Check labs:  ANA, ENA panel, IFE, ACE, B6 Further recommendations pending results Join the balance classes Otherwise follow up 6 months.

## 2024-09-24 ENCOUNTER — Encounter: Payer: Self-pay | Admitting: Neurology

## 2024-09-25 ENCOUNTER — Encounter: Payer: Self-pay | Admitting: Cardiology

## 2024-09-25 ENCOUNTER — Ambulatory Visit: Attending: Cardiology | Admitting: Cardiology

## 2024-09-25 VITALS — BP 116/64 | HR 63 | Ht 62.0 in | Wt 165.0 lb

## 2024-09-25 DIAGNOSIS — E039 Hypothyroidism, unspecified: Secondary | ICD-10-CM | POA: Diagnosis present

## 2024-09-25 DIAGNOSIS — Z8673 Personal history of transient ischemic attack (TIA), and cerebral infarction without residual deficits: Secondary | ICD-10-CM | POA: Diagnosis present

## 2024-09-25 DIAGNOSIS — I2510 Atherosclerotic heart disease of native coronary artery without angina pectoris: Secondary | ICD-10-CM | POA: Insufficient documentation

## 2024-09-25 DIAGNOSIS — R002 Palpitations: Secondary | ICD-10-CM | POA: Insufficient documentation

## 2024-09-25 DIAGNOSIS — R55 Syncope and collapse: Secondary | ICD-10-CM | POA: Insufficient documentation

## 2024-09-25 DIAGNOSIS — E782 Mixed hyperlipidemia: Secondary | ICD-10-CM | POA: Diagnosis not present

## 2024-09-25 NOTE — Progress Notes (Signed)
 " Cardiology Office Note   Date:  09/25/2024  ID:  Shabre, Kreher 06/24/1941, MRN [id number] PCP: Donzella Lauraine SAILOR, DO  [facility] Providers Cardiologist:  Redell Cave, MD     History of Present Illness Nichole [name] is a 84 y.o. female with a past medical history of hyperlipidemia, TIA (1999), hypothyroidism, near syncope, and a history of atypical chest pain, who presents today for follow-up.   She previously undergone echocardiogram which revealed LVEF 60 to 65%.  Coronary CTA was obtained due to to symptoms of chest pain.  She had a cardiac monitor that was placed that shows occasional SVTs with no other significant arrhythmias.   She was seen in clinic 04/20/2022 by Dr.Agbor-Etang.  At that time she had new cardiac concerns and was following up on her previous ZIO monitor.  There were no medication changes that were made and no further testing that was ordered at that time.  With her near syncope with low blood pressure with adequate hydration can consider midodrine.  She was seen in clinic 02/15/2023 stating overall she had been doing well from a cardiac perspective.  She is in a walking boot due to a right foot fracture.  She was continued on her medications and no further testing was ordered at that time.  She did have preoperative cardiovascular clearance completed and she was to continue with surgery at acceptable risk.  She was last seen in clinic 08/16/2023 by Dr. Cave.  At that time she denied any chest pain or shortness of breath and had to undergo recent surgery without issues.  No changes were made to her medication regimen she was to follow-up in a year.   She returns to clinic today stating that she has been doing well from a cardiac perspective.  She is recently getting over the flu.  States her shortness of breath has improved as she has been slightly getting over the recent illness.  She is concerned today about losing power and needing to be  finding a warming center or shelter to be able to stay out over the weekend with the potential of power loss.  She states she has been compliant with her current medication regimen.  She denies any chest pain, palpitations, chest pressure, or peripheral edema.  She denies any recent hospitalizations or visits to emergency department.  ROS: 10 point review of systems has been reviewed and considered negative the exception was been listed in the HPI  Studies Reviewed EKG Interpretation Date/Time:  Friday September 25 2024 15:25:57 EST Ventricular Rate:  63 PR Interval:  212 QRS Duration:  80 QT Interval:  422 QTC Calculation: 431 R Axis:   91  Text Interpretation: Sinus rhythm with 1st degree A-V block Rightward axis When compared with ECG of 16-Aug-2023 14:31, No significant change was found Confirmed by Gerard Frederick ([id number]) on 09/25/2024 3:29:40 PM    Cardiac Studies & Procedures   ______________________________________________________________________________________________   STRESS TESTS  MYOCARDIAL PERFUSION IMAGING 05/27/2015  Interpretation Summary  Nuclear stress EF: 78%.  The left ventricular ejection fraction is hyperdynamic (>65%).  There was no ST segment deviation noted during stress.  No T wave inversion was noted during stress.  Findings consistent with ischemia.  Defect 1: There is a small defect of mild severity present in the basal anterolateral and mid anterolateral location.  Low risk stress nuclear study with mildly abnormal perfusion and normal left ventricular regional and global systolic function. There is a small area of  very mild anterolateral ischemia, that could also be explained by shifting breast attenuation artifact.   ECHOCARDIOGRAM  ECHOCARDIOGRAM COMPLETE 03/20/2022  Narrative ECHOCARDIOGRAM REPORT    Patient Name:   Nichole [name] Date of Exam: 03/20/2022 Medical Rec #:  [id number]        Height:       62.5 in Accession #:     [id number]       Weight:       165.4 lb Date of Birth:  04-Oct-1940       BSA:          1.774 m Patient Age:    72 years         BP:           106/62 mmHg Patient Gender: F                HR:           60 bpm. Exam Location:  [place]  Procedure: 2D Echo, Cardiac Doppler, Color Doppler and Strain Analysis  Indications:    R55 Syncope; R06.02 SOB  History:        Patient has prior history of Echocardiogram examinations. LVH, TIA, Signs/Symptoms:Shortness of Breath and Chest Pain; Risk Factors:Former Smoker. Patient had an episode of near-syncope and SOB. She was out in the heat, gardening which involved lots of squatting and bending and, she's not sure how hydrated she was at the time.  Sonographer:    Arley Pac RDMS, RVT, RDCS Referring Phys: [id number] BRIAN AGBOR-ETANG  IMPRESSIONS   1. Left ventricular ejection fraction, by estimation, is 60 to 65%. The left ventricle has normal function. The left ventricle has no regional wall motion abnormalities. Left ventricular diastolic parameters are consistent with Grade II diastolic dysfunction (pseudonormalization). 2. Right ventricular systolic function is normal. The right ventricular size is normal. Tricuspid regurgitation signal is inadequate for assessing PA pressure. 3. The mitral valve is normal in structure. Mild mitral valve regurgitation. No evidence of mitral stenosis. 4. The aortic valve was not well visualized. Aortic valve regurgitation is not visualized. No aortic stenosis is present. 5. The inferior vena cava is normal in size with greater than 50% respiratory variability, suggesting right atrial pressure of 3 mmHg.  FINDINGS Left Ventricle: Left ventricular ejection fraction, by estimation, is 60 to 65%. The left ventricle has normal function. The left ventricle has no regional wall motion abnormalities. The left ventricular internal cavity size was normal in size. There is borderline left ventricular hypertrophy. Left  ventricular diastolic parameters are consistent with Grade II diastolic dysfunction (pseudonormalization).  Right Ventricle: The right ventricular size is normal. No increase in right ventricular wall thickness. Right ventricular systolic function is normal. Tricuspid regurgitation signal is inadequate for assessing PA pressure.  Left Atrium: Left atrial size was normal in size.  Right Atrium: Right atrial size was normal in size.  Pericardium: Trivial pericardial effusion is present.  Mitral Valve: The mitral valve is normal in structure. Mild mitral valve regurgitation. No evidence of mitral valve stenosis.  Tricuspid Valve: The tricuspid valve is normal in structure. Tricuspid valve regurgitation is not demonstrated. No evidence of tricuspid stenosis.  Aortic Valve: The aortic valve was not well visualized. Aortic valve regurgitation is not visualized. No aortic stenosis is present. Aortic valve mean gradient measures 3.0 mmHg. Aortic valve peak gradient measures 6.0 mmHg. Aortic valve area, by VTI measures 4.11 cm.  Pulmonic Valve: The pulmonic valve was normal in structure. Pulmonic valve regurgitation is not  visualized. No evidence of pulmonic stenosis.  Aorta: The aortic root is normal in size and structure.  Venous: The inferior vena cava is normal in size with greater than 50% respiratory variability, suggesting right atrial pressure of 3 mmHg.  IAS/Shunts: No atrial level shunt detected by color flow Doppler.   LEFT VENTRICLE PLAX 2D LVIDd:         4.10 cm     Diastology LVIDs:         2.60 cm     LV e' medial:    4.57 cm/s LV PW:         0.90 cm     LV E/e' medial:  21.6 LV IVS:        1.00 cm     LV e' lateral:   6.96 cm/s LVOT diam:     2.10 cm     LV E/e' lateral: 14.2 LV SV:         110 LV SV Index:   62 LVOT Area:     3.46 cm  LV Volumes (MOD) LV vol d, MOD A2C: 51.7 ml LV vol d, MOD A4C: 60.0 ml LV vol s, MOD A2C: 23.7 ml LV vol s, MOD A4C: 27.7 ml LV SV  MOD A2C:     28.0 ml LV SV MOD A4C:     60.0 ml LV SV MOD BP:      30.5 ml  RIGHT VENTRICLE RV Basal diam:  3.30 cm RV S prime:     11.50 cm/s TAPSE (M-mode): 2.1 cm  LEFT ATRIUM             Index        RIGHT ATRIUM           Index LA diam:        3.60 cm 2.03 cm/m   RA Area:     15.30 cm LA Vol (A2C):   34.6 ml 19.51 ml/m  RA Volume:   39.70 ml  22.38 ml/m LA Vol (A4C):   36.3 ml 20.46 ml/m LA Biplane Vol: 37.7 ml 21.25 ml/m AORTIC VALVE                    PULMONIC VALVE AV Area (Vmax):    3.72 cm     PV Vmax:       0.69 m/s AV Area (Vmean):   3.93 cm     PV Peak grad:  1.9 mmHg AV Area (VTI):     4.11 cm AV Vmax:           122.00 cm/s AV Vmean:          85.900 cm/s AV VTI:            0.268 m AV Peak Grad:      6.0 mmHg AV Mean Grad:      3.0 mmHg LVOT Vmax:         131.00 cm/s LVOT Vmean:        97.500 cm/s LVOT VTI:          0.318 m LVOT/AV VTI ratio: 1.19  AORTA Ao Root diam: 3.40 cm Ao Asc diam:  3.50 cm Ao Arch diam: 2.5 cm  MITRAL VALVE MV Area (PHT): 3.15 cm    SHUNTS MV Decel Time: 241 msec    Systemic VTI:  0.32 m MV E velocity: 98.50 cm/s  Systemic Diam: 2.10 cm MV A velocity: 94.30 cm/s MV E/A ratio:  1.04  Evalene Lunger MD  Electronically signed by Evalene Lunger MD Signature Date/Time: 03/20/2022/6:14:25 PM    Final    MONITORS  LONG TERM MONITOR (3-14 DAYS) 03/19/2022  Narrative Patch Wear Time:  12 days and 23 hours (2023-06-23T12:00:18-0400 to 2023-07-06T11:45:00-0400)  Patient had a min HR of 51 bpm, max HR of 187 bpm, and avg HR of 64 bpm. Predominant underlying rhythm was Sinus Rhythm. 21 Supraventricular Tachycardia runs occurred, the run with the fastest interval lasting 12 beats with a max rate of 187 bpm, the longest lasting 12 beats with an avg rate of 124 bpm. Isolated SVEs were rare (<1.0%), SVE Couplets were rare (<1.0%), and SVE Triplets were rare (<1.0%). Isolated VEs were rare (<1.0%), and no VE Couplets or VE Triplets  were present.  Occasional paroxysmal SVT noted.  No other significant arrhythmias.   CT SCANS  CT CORONARY FRACTIONAL FLOW RESERVE DATA PREP 02/22/2022  Narrative EXAM: CT FFR ANALYSIS  CLINICAL DATA:  Abnormal CCTA  FINDINGS: FFRct analysis was performed on the original cardiac CT angiogram dataset. Diagrammatic representation of the FFRct analysis is provided in a separate PDF document in PACS. This dictation was created using the PDF document and an interactive 3D model of the results. 3D model is not available in the EMR/PACS. Normal FFR range is >0.80.  1. Left Main:  No significant stenosis.  2. LAD: No significant stenosis.  FFRct 0.93 3. LCX: No significant stenosis.  FFRct 0.82 4. RCA: No significant stenosis.  FFRct 0.93  IMPRESSION: 1.  CT FFR analysis didn't show any significant stenosis.   Electronically Signed By: Redell Cave M.D. On: 02/23/2022 08:05   CT CORONARY MORPH W/CTA COR W/SCORE 02/22/2022  Addendum 02/22/2022  4:53 PM ADDENDUM REPORT: 02/22/2022 16:51  ADDENDUM: The following report is an over-read performed by radiologist Dr. Reyes Holder of [facility] on February 22, 2022. This over-read does not include interpretation of cardiac or coronary anatomy or pathology. The coronary calcium  score/coronary CTA interpretation by the cardiologist is attached.  COMPARISON:  None.  FINDINGS: Vascular: No acute non-cardiac vascular finding. Aortic atherosclerosis.  Mediastinum/Nodes: No pathologically enlarged mediastinal, or hilar lymph nodes in the visualized portions of the thorax. Distal esophagus is grossly unremarkable. Small hiatal hernia.  Lungs/Pleura: Within the visualized portions of the thorax there are no suspicious appearing pulmonary nodules or masses. No pleural effusions and no pneumothorax  Upper Abdomen: Visualized portions of the upper abdomen are unremarkable.  Musculoskeletal: No acute osseous  abnormality.  IMPRESSION: No significant incidental noncardiac finding noted.   Electronically Signed By: Reyes Holder M.D. On: 02/22/2022 16:51  Narrative CLINICAL DATA:  Chest pain  EXAM: Cardiac/Coronary  CTA  TECHNIQUE: The patient was scanned on a Siemens Somatom go.Top scanner.  : A retrospective scan was triggered in the descending thoracic aorta. Axial non-contrast 3 mm slices were carried out through the heart. The data set was analyzed on a dedicated work station and scored using the Agatson method. Gantry rotation speed was 330 msecs and collimation was .6 mm. 15 mg of ivabradine , 0.8 mg of sl NTG was given. The 3D data set was reconstructed in 5% intervals of the 60-95 % of the R-R cycle. Diastolic phases were analyzed on a dedicated work station using MPR, MIP and VRT modes. The patient received 87 cc of contrast.  FINDINGS: Aorta: Normal size. Aortic root and descending aorta calcifications. No dissection.  Aortic Valve:  Trileaflet.  No calcifications.  Coronary Arteries:  Normal coronary origin.  Right dominance.  RCA is a dominant artery that gives rise to PDA and PLA. There is no plaque.  Left main gives rise to LAD and LCX arteries. There is no LM disease  LAD has no plaque.  LCX is a non-dominant artery that gives rise to two obtuse marginal branches. There is calcified plaque in the mid LCx causing moderate stenosis (50%). OM1 has minimal proximal stenosis.  Other findings:  Normal pulmonary vein drainage into the left atrium.  Normal left atrial appendage without a thrombus.  Normal size of the pulmonary artery.  IMPRESSION: 1. Coronary calcium  score of 242. This was 70th percentile for age and sex matched control.  2. Normal coronary origin with right dominance.  3. 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  4. CAD-RADS 3. Moderate stenosis. Consider symptom-guided anti-ischemic pharmacotherapy as well as risk factor modification per  guideline directed care.  5. Additional analysis with CT FFR will be submitted and reported separately.  Electronically Signed: By: Redell Cave M.D. On: 02/22/2022 16:19     ______________________________________________________________________________________________      Risk Assessment/Calculations           Physical Exam VS:  BP 116/64 (BP Location: Left Arm, Patient Position: Sitting, Cuff Size: Large)   Pulse 63   Ht 5' 2 (1.575 m)   Wt 165 lb (74.8 kg)   SpO2 98%   BMI 30.18 kg/m        Wt Readings from Last 3 Encounters:  09/25/24 165 lb (74.8 kg)  09/23/24 167 lb (75.8 kg)  09/07/24 160 lb (72.6 kg)    GEN: Well nourished, well developed in no acute distress NECK: No JVD; No carotid bruits CARDIAC: RRR, no murmurs, rubs, gallops RESPIRATORY:  Clear to auscultation without rales, wheezing or rhonchi  ABDOMEN: Soft, non-tender, non-distended EXTREMITIES:  No edema; No deformity   ASSESSMENT AND PLAN 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  Mild nonobstructive coronary disease noted on coronary CTA with mild to moderate mid left circumflex stenosis of 40-50%, CT FFR with no significant stenosis.  EKG today reveals sinus rhythm with first gravy block with a rate of 63 with no acute ischemic changes noted.  She is continued on clopidogrel  75 mg daily and atorvastatin  40 mg daily.  No further ischemic evaluation needed at this time.  Hyperlipidemia with last LDL 65 in 2024.  She is continued on atorvastatin .  AST and ALT in January 2026 were normal.  She has been scheduled for an updated lipid panel.  Paroxysmal SVT where her palpitations have been quiescent.   History of near syncope with previous workup unrevealing.  Blood pressure remained stable today at 116/64.  No further episodes noted.  Continue current medications as needed.  Hypothyroidism where he is continued on alternating doses of Synthroid .  This continues to be  managed by her PCP.  Prior history of TIA 1999 she is continued on clopidogrel  since that time.  She continues to follow with neurology.       Dispo: Patient returns to clinic to see MD/APP in 11 to 12 months or sooner if needed for further evaluation.  Signed, Magally Vahle, NP   "

## 2024-09-25 NOTE — Patient Instructions (Addendum)
 Medication Instructions:  Your physician recommends that you continue on your current medications as directed. Please refer to the Current Medication list given to you today.  *If you need a refill on your cardiac medications before your next appointment, please call your pharmacy*  Lab Work: Your provider would like for you to return in 2-3 weeks to have the following labs drawn: LIPID PANEL.   Please go to [facility] [address] ([facility] [address], [place] [address] You do not need an appointment.  They are open from 8 am- 4:30 pm.  Lunch from 1:00 pm- 2:00 pm You WILL NEED to be fasting.  If you have labs (blood work) drawn today and your tests are completely normal, you will receive your results only by: MyChart Message (if you have MyChart) OR A paper copy in the mail If you have any lab test that is abnormal or we need to change your treatment, we will call you to review the results.  Testing/Procedures: No test ordered today   Follow-Up: At [facility], you and your health needs are our priority.  As part of our continuing mission to provide you with exceptional heart care, our providers are all part of one team.  This team includes your primary Cardiologist (physician) and Advanced Practice Providers or APPs (Physician Assistants and Nurse Practitioners) who all work together to provide you with the care you need, when you need it.  Your next appointment:  12 month(s)  Provider:  Redell Cave, MD or Tylene Lunch, NP    We recommend signing up for the patient portal called MyChart.  Sign up information is provided on this After Visit Summary.  MyChart is used to connect with patients for Virtual Visits (Telemedicine).  Patients are able to view lab/test results, encounter notes, upcoming appointments, etc.  Non-urgent messages can be sent to your provider as well.   To learn more about what you can do with MyChart, go to  [email or web address].

## 2024-09-26 LAB — IMMUNOFIXATION ELECTROPHORESIS
IgG (Immunoglobin G), Serum: 736 mg/dL (ref 600–1540)
IgM, Serum: 47 mg/dL — ABNORMAL LOW (ref 50–300)
Immunoglobulin A: 193 mg/dL (ref 70–320)

## 2024-09-26 LAB — ANA, IFA COMPREHENSIVE PANEL
Anti Nuclear Antibody (ANA): POSITIVE — AB
ENA SM Ab Ser-aCnc: <1 AI
SM/RNP: <1 AI
SSA (Ro) (ENA) Antibody, IgG: <1 AI
SSB (La) (ENA) Antibody, IgG: <1 AI
Scleroderma (Scl-70) (ENA) Antibody, IgG: <1 AI
ds DNA Ab: 1 [IU]/mL

## 2024-09-26 LAB — ANTI-NUCLEAR AB-TITER (ANA TITER): ANA Titer 1: 1:80 {titer} — ABNORMAL HIGH

## 2024-09-26 LAB — ANGIOTENSIN CONVERTING ENZYME: Angiotensin-Converting Enzyme: 37 U/L (ref 9–67)

## 2024-09-26 LAB — VITAMIN B6: Vitamin B6: 43.5 ng/mL — ABNORMAL HIGH (ref 2.1–21.7)

## 2024-09-28 ENCOUNTER — Ambulatory Visit: Payer: Self-pay | Admitting: Neurology

## 2024-09-29 ENCOUNTER — Ambulatory Visit: Admitting: "Endocrinology

## 2024-09-29 NOTE — Progress Notes (Signed)
 Patient advised of her labs results.    Patient wants to know if she should still do the EMG

## 2024-10-01 NOTE — Progress Notes (Signed)
 LMOVM to keep EMG as scheduled. Per Dr.Jaffe

## 2024-10-14 ENCOUNTER — Ambulatory Visit: Admitting: "Endocrinology

## 2024-11-19 ENCOUNTER — Encounter: Payer: Self-pay | Admitting: Neurology

## 2024-12-23 ENCOUNTER — Ambulatory Visit: Admitting: Neurology

## 2025-01-06 ENCOUNTER — Ambulatory Visit

## 2025-02-19 ENCOUNTER — Ambulatory Visit

## 2025-03-23 ENCOUNTER — Ambulatory Visit

## 2025-04-05 ENCOUNTER — Ambulatory Visit: Payer: Self-pay | Admitting: Neurology
